# Patient Record
Sex: Female | Born: 1935 | Race: White | Hispanic: No | Smoking: Never smoker
Health system: Southern US, Community
[De-identification: ages and names within clinical notes are randomized; demographics above are authoritative.]

## PROBLEM LIST (undated history)

## (undated) DIAGNOSIS — R569 Unspecified convulsions: Secondary | ICD-10-CM

## (undated) DIAGNOSIS — I639 Cerebral infarction, unspecified: Secondary | ICD-10-CM

## (undated) DIAGNOSIS — I5042 Chronic combined systolic (congestive) and diastolic (congestive) heart failure: Secondary | ICD-10-CM

## (undated) DIAGNOSIS — H3563 Retinal hemorrhage, bilateral: Secondary | ICD-10-CM

## (undated) DIAGNOSIS — I2510 Atherosclerotic heart disease of native coronary artery without angina pectoris: Secondary | ICD-10-CM

## (undated) DIAGNOSIS — K219 Gastro-esophageal reflux disease without esophagitis: Secondary | ICD-10-CM

## (undated) DIAGNOSIS — I10 Essential (primary) hypertension: Secondary | ICD-10-CM

## (undated) DIAGNOSIS — H8110 Benign paroxysmal vertigo, unspecified ear: Secondary | ICD-10-CM

## (undated) DIAGNOSIS — M1990 Unspecified osteoarthritis, unspecified site: Secondary | ICD-10-CM

## (undated) DIAGNOSIS — R269 Unspecified abnormalities of gait and mobility: Secondary | ICD-10-CM

## (undated) DIAGNOSIS — E1142 Type 2 diabetes mellitus with diabetic polyneuropathy: Secondary | ICD-10-CM

## (undated) DIAGNOSIS — S82891A Other fracture of right lower leg, initial encounter for closed fracture: Secondary | ICD-10-CM

## (undated) DIAGNOSIS — I739 Peripheral vascular disease, unspecified: Secondary | ICD-10-CM

## (undated) DIAGNOSIS — I219 Acute myocardial infarction, unspecified: Secondary | ICD-10-CM

## (undated) DIAGNOSIS — G40309 Generalized idiopathic epilepsy and epileptic syndromes, not intractable, without status epilepticus: Secondary | ICD-10-CM

## (undated) DIAGNOSIS — I255 Ischemic cardiomyopathy: Secondary | ICD-10-CM

## (undated) DIAGNOSIS — E785 Hyperlipidemia, unspecified: Secondary | ICD-10-CM

## (undated) DIAGNOSIS — E119 Type 2 diabetes mellitus without complications: Secondary | ICD-10-CM

## (undated) HISTORY — DX: Peripheral vascular disease, unspecified: I73.9

## (undated) HISTORY — DX: Essential (primary) hypertension: I10

## (undated) HISTORY — DX: Hyperlipidemia, unspecified: E78.5

## (undated) HISTORY — DX: Cerebral infarction, unspecified: I63.9

## (undated) HISTORY — PX: ABDOMINAL HYSTERECTOMY: SHX81

## (undated) HISTORY — PX: CARDIAC CATHETERIZATION: SHX172

## (undated) HISTORY — DX: Gastro-esophageal reflux disease without esophagitis: K21.9

## (undated) HISTORY — PX: ABDOMINAL HYSTERECTOMY: SUR658

## (undated) HISTORY — DX: Unspecified abnormalities of gait and mobility: R26.9

## (undated) HISTORY — DX: Generalized idiopathic epilepsy and epileptic syndromes, not intractable, without status epilepticus: G40.309

## (undated) HISTORY — DX: Benign paroxysmal vertigo, unspecified ear: H81.10

## (undated) HISTORY — DX: Other fracture of right lower leg, initial encounter for closed fracture: S82.891A

## (undated) HISTORY — PX: CORONARY ARTERY BYPASS GRAFT: SHX141

## (undated) HISTORY — PX: OTHER SURGICAL HISTORY: SHX169

## (undated) HISTORY — DX: Atherosclerotic heart disease of native coronary artery without angina pectoris: I25.10

## (undated) HISTORY — PX: KNEE SURGERY: SHX244

## (undated) HISTORY — DX: Type 2 diabetes mellitus with diabetic polyneuropathy: E11.42

---

## 1998-04-29 ENCOUNTER — Ambulatory Visit (HOSPITAL_COMMUNITY): Admission: RE | Admit: 1998-04-29 | Discharge: 1998-04-29 | Payer: Self-pay | Admitting: Cardiovascular Disease

## 2000-01-10 ENCOUNTER — Encounter: Payer: Self-pay | Admitting: Emergency Medicine

## 2000-01-10 ENCOUNTER — Emergency Department (HOSPITAL_COMMUNITY): Admission: EM | Admit: 2000-01-10 | Discharge: 2000-01-10 | Payer: Self-pay | Admitting: Emergency Medicine

## 2010-09-14 ENCOUNTER — Ambulatory Visit: Payer: Self-pay | Admitting: Cardiology

## 2010-10-14 ENCOUNTER — Ambulatory Visit: Payer: Self-pay | Admitting: Internal Medicine

## 2010-10-20 ENCOUNTER — Ambulatory Visit: Payer: Self-pay | Admitting: Cardiovascular Disease

## 2010-10-28 ENCOUNTER — Ambulatory Visit: Payer: Self-pay | Admitting: Internal Medicine

## 2010-10-28 ENCOUNTER — Ambulatory Visit (HOSPITAL_COMMUNITY): Admission: RE | Admit: 2010-10-28 | Discharge: 2010-10-28 | Payer: Self-pay | Admitting: Internal Medicine

## 2010-11-09 ENCOUNTER — Ambulatory Visit: Payer: Self-pay | Admitting: Cardiology

## 2010-11-26 ENCOUNTER — Ambulatory Visit: Payer: Self-pay | Admitting: Cardiovascular Disease

## 2010-12-03 ENCOUNTER — Ambulatory Visit
Admission: RE | Admit: 2010-12-03 | Discharge: 2010-12-03 | Payer: Self-pay | Source: Home / Self Care | Attending: Cardiology | Admitting: Cardiology

## 2010-12-28 ENCOUNTER — Encounter: Payer: Self-pay | Admitting: Cardiology

## 2010-12-29 ENCOUNTER — Ambulatory Visit: Payer: Self-pay | Admitting: Cardiovascular Disease

## 2011-01-06 ENCOUNTER — Ambulatory Visit
Admission: RE | Admit: 2011-01-06 | Discharge: 2011-01-06 | Payer: Self-pay | Source: Home / Self Care | Attending: Internal Medicine | Admitting: Internal Medicine

## 2011-01-07 ENCOUNTER — Ambulatory Visit (HOSPITAL_COMMUNITY)
Admission: RE | Admit: 2011-01-07 | Discharge: 2011-01-07 | Payer: Self-pay | Source: Home / Self Care | Attending: Internal Medicine | Admitting: Internal Medicine

## 2011-01-27 NOTE — Letter (Signed)
Summary: External Correspondence/ PROGRESS NOTE [facility]  External Correspondence/ PROGRESS NOTE [facility]   Imported By: Dorise Hiss 12/29/2010 10:36:11  _____________________________________________________________________  External Attachment:    Type:   Image     Comment:   External Document

## 2011-02-14 ENCOUNTER — Ambulatory Visit (HOSPITAL_COMMUNITY): Payer: Self-pay

## 2011-02-14 ENCOUNTER — Other Ambulatory Visit (HOSPITAL_COMMUNITY): Payer: Self-pay | Admitting: Internal Medicine

## 2011-02-17 ENCOUNTER — Ambulatory Visit (HOSPITAL_COMMUNITY)
Admission: RE | Admit: 2011-02-17 | Discharge: 2011-02-17 | Disposition: A | Discharge status: Home / Self Care | Payer: Medicare Other | Source: Ambulatory Visit | Attending: Gastroenterology | Admitting: Gastroenterology

## 2011-02-17 ENCOUNTER — Ambulatory Visit (HOSPITAL_COMMUNITY)
Admission: RE | Admit: 2011-02-17 | Discharge: 2011-02-17 | Disposition: A | Discharge status: Home / Self Care | Payer: Medicare Other | Source: Ambulatory Visit | Attending: Internal Medicine | Admitting: Internal Medicine

## 2011-02-17 DIAGNOSIS — R1310 Dysphagia, unspecified: Secondary | ICD-10-CM | POA: Insufficient documentation

## 2011-03-08 LAB — POCT I-STAT GLUCOSE
Glucose, Bld: 173 mg/dL — ABNORMAL HIGH (ref 70–99)
Operator id: 141321

## 2011-03-08 LAB — GLUCOSE, CAPILLARY: Glucose-Capillary: 111 mg/dL — ABNORMAL HIGH (ref 70–99)

## 2011-03-31 ENCOUNTER — Telehealth: Payer: Self-pay | Admitting: Cardiovascular Disease

## 2011-03-31 NOTE — Telephone Encounter (Signed)
Pharmacist called, she reviewed pts meds due to C/O nightmares, hallucintion at night. Found metoprolol can cause night disturbances and sugg trial of atenolol. Dr Elease Hashimoto contacted and was ok with dc of metoprolol and start of atenolol 50mg  bid #60 R1. Called ok to pharmacy.Alfonso Ramus RN

## 2011-04-04 ENCOUNTER — Telehealth: Payer: Self-pay | Admitting: Cardiovascular Disease

## 2011-04-04 MED ORDER — ATENOLOL 50 MG PO TABS: ORAL_TABLET | ORAL | Status: DC

## 2011-04-04 NOTE — Telephone Encounter (Signed)
PT HAVING BP PROBLEMS. SW HER PCP, SUGGESTED SHE CALL HERE AND WANTS TO BE SEEN TODAY. PLACED CHART IN BOX.

## 2011-04-04 NOTE — Telephone Encounter (Signed)
Pt said bp was low since new med of Atenolol 50mg  bid. 113/59, 109/54, 123/54, 113/56, 129/59. This am it was 139/65. Pt feels well other than cold like symtoms of congestion head and cough. Dr Elease Hashimoto consulted. Pt to lower dose to 50mg  in am and 25mg  at hs. App set up for follow up to bp.Pt verbalized understanding.Alfonso Ramus RN

## 2011-04-12 ENCOUNTER — Encounter: Payer: Self-pay | Admitting: *Deleted

## 2011-04-13 ENCOUNTER — Ambulatory Visit (INDEPENDENT_AMBULATORY_CARE_PROVIDER_SITE_OTHER): Payer: Medicare Other | Admitting: Cardiovascular Disease

## 2011-04-13 ENCOUNTER — Encounter: Payer: Self-pay | Admitting: Cardiovascular Disease

## 2011-04-13 DIAGNOSIS — I509 Heart failure, unspecified: Secondary | ICD-10-CM | POA: Insufficient documentation

## 2011-04-13 DIAGNOSIS — I2510 Atherosclerotic heart disease of native coronary artery without angina pectoris: Secondary | ICD-10-CM

## 2011-04-13 NOTE — Assessment & Plan Note (Signed)
She's currently on a beta blocker. I tried to add lisinopril during her last visit but for some reason she is not on that medication. In addition she does not tolerate Lasix.  I've encouraged her shoulder radiating any extra salt.  It is somewhat difficult to manage her congestive heart failure because of her intolerance to multiple medications.  Fortunately, she is not very symptomatic.

## 2011-04-13 NOTE — Progress Notes (Signed)
[name] Date of Birth  1931/06/22 [facility] [address]     [address] [place], [place]  [address] [phone]  Fax  [phone]  History of Present Illness:  [name] is an elderly female with a history of coronary artery disease and mild congestive heart failure. She is status post coronary artery bypass grafting. Her last heart catheterization in December 2007 revealed patent grafts. She was found have an ejection fraction of 40% at that time.  She's been tried on Lasix on several occasions but has had syncope/presyncope each time she tried to take it.    She also has a history of stroke and has been found have chronic aspiration.  She complains of shortness of breath particularly when she lies down. She does not get much exercise because of her stroke and other issues.  Current Outpatient Prescriptions on File Prior to Visit  Medication Sig Dispense Refill  . allopurinol (ZYLOPRIM) 300 MG tablet Take 300 mg by mouth daily.        Marland Kitchen atenolol (TENORMIN) 50 MG tablet One tablet in am and a half tablet (25mg ) in pm  30 tablet  11  . calcium citrate (CALCITRATE - DOSED IN MG ELEMENTAL CALCIUM) 950 MG tablet Take 1 tablet by mouth 4 (four) times daily.        . fish oil-omega-3 fatty acids 1000 MG capsule Take 2 g by mouth 2 (two) times daily.        Marland Kitchen glyBURIDE (DIABETA) 5 MG tablet Take 5 mg by mouth 2 (two) times daily with a meal. Take 7.5mg  daily       . hydrOXYzine (ATARAX) 25 MG tablet Take 25 mg by mouth 3 (three) times daily as needed. Take 1-1/2 tabs TID       . metFORMIN (GLUCOPHAGE-XR) 500 MG 24 hr tablet Take 500 mg by mouth 4 (four) times daily.        Marland Kitchen omeprazole (PRILOSEC) 20 MG capsule Take 20 mg by mouth 2 (two) times daily.        Marland Kitchen aspirin 81 MG tablet Take 81 mg by mouth daily.        Marland Kitchen dexlansoprazole (DEXILANT) 60 MG capsule Take 60 mg by mouth daily.        Marland Kitchen DISCONTD: clopidogrel (PLAVIX) 75 MG tablet Take 75 mg  by mouth daily.        Marland Kitchen DISCONTD: metoprolol (LOPRESSOR) 50 MG tablet Take 50 mg by mouth 2 (two) times daily. Take 50mg  in the AM and 25mg  each PM         Allergies  Allergen Reactions  . Furosemide Other (See Comments)    presyncope  . Bactrim Rash  . Ciprocin-Fluocin-Procin (Fluocinolone Acetonide) Rash  . Codeine Rash  . Morphine And Related Rash  . Pentazocine Rash    Past Medical History  Diagnosis Date  . CHF (congestive heart failure)     EF 48%  . Stroke   . Diabetes mellitus   . Hypertension   . Gout   . GERD (gastroesophageal reflux disease)   . Hyperkalemia     Past Surgical History  Procedure Date  . Coronary artery bypass graft     1997  . Cardiac catheterization     11/2010-patent grafts  . Knee surgery     bilateral  . Abdominal hysterectomy     History  Smoking status  . Never Smoker   Smokeless tobacco  . Not on  file    History  Alcohol Use No    Family History  Problem Relation Age of Onset  . Heart attack Father 56  . Heart disease Father     Reviw of Systems:  Reviewed in the HPI.  All other systems are negative.  Physical Exam: BP 126/68  Pulse 80  Wt 176 lb (79.833 kg) The patient is alert and oriented x 3.  The mood and affect are normal.  The skin is warm and dry.  Color is normal.  The HEENT exam reveals that the sclera are nonicteric.  The mucous membranes are moist.  The carotids are 2+ without bruits.  There is no thyromegaly.  There is no JVD.  The lungs are clear.  The chest wall is non tender.  The heart exam reveals a regular rate with a normal S1 and S2.  There are no murmurs, gallops, or rubs.  The PMI is not displaced.   Abdominal exam reveals good bowel sounds.  There is no guarding or rebound.  There is no hepatosplenomegaly or tenderness.  There are no masses.  Exam of the legs reveal no clubbing, cyanosis, or edema.  The legs are without rashes.  The distal pulses are intact.  Cranial nerves II - XII are intact.   Motor and sensory functions are intact.  The gait is normal.  Assessment / Plan:

## 2011-04-13 NOTE — Assessment & Plan Note (Signed)
Mrs. [name] is doing fairly well. She describes some episodes of chest pain but they're very atypical. In addition, we performed a heart catheterization on her 5 months ago and she had patent grafts. I do not think that her symptoms are due to angina. I've encouraged her to try to exercise as much is possible. We'll see her in 6 months.

## 2011-06-27 ENCOUNTER — Other Ambulatory Visit: Payer: Self-pay | Admitting: *Deleted

## 2011-06-27 MED ORDER — ATENOLOL 50 MG PO TABS: ORAL_TABLET | ORAL | Status: DC

## 2011-06-27 NOTE — Progress Notes (Signed)
Pt called and dose confirmed,  App made at pt request for September. Med refilled. jodette briely rn

## 2011-07-29 ENCOUNTER — Encounter (INDEPENDENT_AMBULATORY_CARE_PROVIDER_SITE_OTHER): Payer: Medicare Other | Admitting: Ophthalmology

## 2011-07-31 ENCOUNTER — Inpatient Hospital Stay (HOSPITAL_COMMUNITY): Payer: Medicare Other

## 2011-07-31 ENCOUNTER — Inpatient Hospital Stay (HOSPITAL_COMMUNITY)
Admission: AD | Admit: 2011-07-31 | Discharge: 2011-08-03 | DRG: 470 | Disposition: A | Discharge status: Skilled Nursing Facility | Payer: Medicare Other | Source: Other Acute Inpatient Hospital | Attending: Internal Medicine | Admitting: Internal Medicine

## 2011-07-31 DIAGNOSIS — D649 Anemia, unspecified: Secondary | ICD-10-CM | POA: Diagnosis present

## 2011-07-31 DIAGNOSIS — Z951 Presence of aortocoronary bypass graft: Secondary | ICD-10-CM

## 2011-07-31 DIAGNOSIS — Z96659 Presence of unspecified artificial knee joint: Secondary | ICD-10-CM

## 2011-07-31 DIAGNOSIS — G40909 Epilepsy, unspecified, not intractable, without status epilepticus: Secondary | ICD-10-CM | POA: Diagnosis present

## 2011-07-31 DIAGNOSIS — D72829 Elevated white blood cell count, unspecified: Secondary | ICD-10-CM | POA: Diagnosis not present

## 2011-07-31 DIAGNOSIS — Z66 Do not resuscitate: Secondary | ICD-10-CM | POA: Diagnosis present

## 2011-07-31 DIAGNOSIS — I10 Essential (primary) hypertension: Secondary | ICD-10-CM | POA: Diagnosis present

## 2011-07-31 DIAGNOSIS — W19XXXA Unspecified fall, initial encounter: Secondary | ICD-10-CM | POA: Diagnosis present

## 2011-07-31 DIAGNOSIS — M109 Gout, unspecified: Secondary | ICD-10-CM | POA: Diagnosis present

## 2011-07-31 DIAGNOSIS — E119 Type 2 diabetes mellitus without complications: Secondary | ICD-10-CM | POA: Diagnosis present

## 2011-07-31 DIAGNOSIS — I69921 Dysphasia following unspecified cerebrovascular disease: Secondary | ICD-10-CM

## 2011-07-31 DIAGNOSIS — S72009A Fracture of unspecified part of neck of unspecified femur, initial encounter for closed fracture: Principal | ICD-10-CM | POA: Diagnosis present

## 2011-07-31 DIAGNOSIS — I2510 Atherosclerotic heart disease of native coronary artery without angina pectoris: Secondary | ICD-10-CM | POA: Diagnosis present

## 2011-07-31 DIAGNOSIS — I509 Heart failure, unspecified: Secondary | ICD-10-CM | POA: Diagnosis present

## 2011-07-31 DIAGNOSIS — Z79899 Other long term (current) drug therapy: Secondary | ICD-10-CM

## 2011-07-31 DIAGNOSIS — I2589 Other forms of chronic ischemic heart disease: Secondary | ICD-10-CM | POA: Diagnosis present

## 2011-07-31 DIAGNOSIS — K219 Gastro-esophageal reflux disease without esophagitis: Secondary | ICD-10-CM | POA: Diagnosis present

## 2011-07-31 HISTORY — PX: OTHER SURGICAL HISTORY: SHX169

## 2011-07-31 LAB — PROTIME-INR
INR: 1.05 (ref 0.00–1.49)
Prothrombin Time: 13.9 seconds (ref 11.6–15.2)

## 2011-07-31 LAB — GLUCOSE, CAPILLARY

## 2011-08-01 ENCOUNTER — Inpatient Hospital Stay (HOSPITAL_COMMUNITY): Payer: Medicare Other

## 2011-08-01 LAB — BASIC METABOLIC PANEL
BUN: 26 mg/dL — ABNORMAL HIGH (ref 6–23)
Calcium: 8.8 mg/dL (ref 8.4–10.5)
GFR calc Af Amer: 57 mL/min — ABNORMAL LOW (ref >60)
GFR calc non Af Amer: 47 mL/min — ABNORMAL LOW (ref >60)
Glucose, Bld: 162 mg/dL — ABNORMAL HIGH (ref 70–99)
Potassium: 4.8 mEq/L (ref 3.5–5.1)
Sodium: 136 mEq/L (ref 135–145)

## 2011-08-01 LAB — GLUCOSE, CAPILLARY: Glucose-Capillary: 232 mg/dL — ABNORMAL HIGH (ref 70–99)

## 2011-08-01 LAB — CBC
HCT: 26.1 % — ABNORMAL LOW (ref 36.0–46.0)
Hemoglobin: 8.5 g/dL — ABNORMAL LOW (ref 12.0–15.0)
MCH: 28.3 pg (ref 26.0–34.0)
MCHC: 32.6 g/dL (ref 30.0–36.0)
RDW: 15.3 % (ref 11.5–15.5)

## 2011-08-01 LAB — HEMOGLOBIN A1C
Hgb A1c MFr Bld: 8 % — ABNORMAL HIGH (ref <5.7)
Mean Plasma Glucose: 183 mg/dL — ABNORMAL HIGH (ref <117)

## 2011-08-02 LAB — GLUCOSE, CAPILLARY
Glucose-Capillary: 246 mg/dL — ABNORMAL HIGH (ref 70–99)
Glucose-Capillary: 193 mg/dL — ABNORMAL HIGH (ref 70–99)
Glucose-Capillary: 183 mg/dL — ABNORMAL HIGH (ref 70–99)

## 2011-08-02 LAB — CBC
MCHC: 33.6 g/dL (ref 30.0–36.0)
Platelets: 171 10*3/uL (ref 150–400)
RDW: 15.5 % (ref 11.5–15.5)
WBC: 13.2 10*3/uL — ABNORMAL HIGH (ref 4.0–10.5)

## 2011-08-02 LAB — BASIC METABOLIC PANEL
BUN: 28 mg/dL — ABNORMAL HIGH (ref 6–23)
CO2: 20 mEq/L (ref 19–32)
Chloride: 101 mEq/L (ref 96–112)
Glucose, Bld: 161 mg/dL — ABNORMAL HIGH (ref 70–99)
Potassium: 4.8 mEq/L (ref 3.5–5.1)
Sodium: 133 mEq/L — ABNORMAL LOW (ref 135–145)

## 2011-08-02 LAB — PROTIME-INR
INR: 1.26 (ref 0.00–1.49)
Prothrombin Time: 16.1 seconds — ABNORMAL HIGH (ref 11.6–15.2)

## 2011-08-02 NOTE — H&P (Signed)
Tiffany, Proctor NO.:  [id number]  MEDICAL RECORD NO.:  [id number]  LOCATION:  2014                         FACILITY:  [facility]  PHYSICIAN:  Tarry Kos, MD       DATE OF BIRTH:  [birth year]  DATE OF ADMISSION:  07/31/2011 DATE OF DISCHARGE:                             HISTORY & PHYSICAL   CHIEF COMPLAINT:  Larey Seat, left hip pain.  HISTORY OF PRESENT ILLNESS:  [name] is a 77 year old female status post CABG in 1997, diabetic, history of CVA, and seizure disorder, who presents after being transferred from [facility] with a left hip fracture. She was going to the bathroom earlier this morning, was pulling down her pants and getting on the toilet and she sort of toppled over before actually sitting on the toilet to go to the bathroom.  She says she just lost her balance.  She denies any loss of consciousness or any seizure activity.  She is transferred here for orthopedic surgery.  We are being asked to admit the patient for ortho.  She has no active medical issues.  REVIEW OF SYSTEMS:  Otherwise negative.  PAST MEDICAL HISTORY: 1. Status post CABG. 2. She has had a cardiac cath in December 2011, which showed patent     grafts, EF is 40%, global hypokinesis.  It was recommended medical     management for coronary artery disease and congestive heart failure     at that time. 3. Seizure disorder. 4. Status post CVA with resultant dysphasia. 5. She had a Schatzki ring and sliding hiatal hernia on an EGD in     January 2012, status post dilation of esophagus at that time. 6. Polypectomies on colonoscopy also in January 2012. 7. History of mild congestive heart failure. 8. Hypertension. 9. Gout.  SOCIAL HISTORY:  She is a nonsmoker.  No alcohol.  No IV drug abuse. She has one son.  She is DNR.  ALLERGIES:  CIPRO, MORPHINE, CODEINE, SULFA, LEVAQUIN, CEPHALOSPORINS, PROTONIX; all of these are listed as swelling and/or rash.  She has never taken [name]  before.  HOME MEDICATIONS: 1. Zinc oxide ointment as needed. 2. Artificial tears. 3. Citracal. 4. Vitamin D over the counter 2 tablets twice a day. 5. Tono-Pen as needed. 6. Biotin 1000 mg daily. 7. Red yeast rice 600 mg daily. 8. Fish oil 1 g 2 tablets daily. 9. Vitamin D3 over the counter daily. 10.Aggrenox 25/200 one capsule twice a day. 11.Hydroxyzine 25 mg one and a half tablets 3 times a day. 12.Omeprazole 20 mg twice a day. 13.Lamotrigine 100 mg a half tablet twice a day. 14.Atenolol 50 mg twice a day. 15.Glyburide 5 mg twice a day. 16.Metformin 500 mg 2 tablets twice a day. 17.Allopurinol 300 mg 1 tablet daily.  PHYSICAL EXAMINATION:  VITAL SIGNS:  Temperature is 97.6, pulse 88, respirations 20, blood pressure 150/76, 96% O2 sats on 2 L nasal cannula. GENERAL:  She is alert and oriented x4, no apparent distress, cooperative, friendly. HEENT:  Extraocular muscles intact.  Pupils are equal, reactive to light.  Oropharynx clear.  Mucous membranes moist. NECK:  JVD.  No carotid bruits. CARDIAC:  Regular rate and rhythm without murmurs,  or gallops. CHEST:  Clear to auscultation bilaterally.  No wheezes, rhonchi, or rales. ABDOMEN:  Soft, nontender, nondistended.  Positive bowel sounds.  No hepatosplenomegaly. EXTREMITIES:  No clubbing, cyanosis, edema.  A painful left hip. NEUROLOGIC:  Cranial nerves II through XII grossly intact.  No focal neurologic deficits.  Lower extremities not assessed due to pain and obvious fracture. HIP:  Left hip x-ray from outside facility shows an impacted left femoral neck fracture.  INR is 0.9, PT is 13.28, a PTT is 29.2.  Glucose 211, BUN and creatinine 32 and 1.09, calcium 10, sodium 136, potassium 4.8, chloride 102, CO2 is 24.  Her hemoglobin is 10.8, white count 13.7, platelet count 280. Chest x-ray is negative.  A 12-lead EKG shows right bundle-branch block, normal sinus rhythm.  ASSESSMENT AND PLAN:  This is a 77 year old female  with acute left hip fracture from a mechanical fall. 1. Acute left hip fracture from mechanical.  I am going to hold her     Aggrenox at this time.  Dr. Otelia Sergeant, on call orthopedic surgeon, has     already been called and is aware that the patient is coming over.     She is supposed to be going to surgery later on today, however,     again, she is on Aggrenox.  I am going to obviously hold this at     this time and provide her some [name] for pain relief. 2. History of coronary artery bypass graft with recent nonobstructive     coronary artery disease on catheterization in December 2011.  She     does have history of heart failure.  We will have to monitor very     closely for any postoperative complications. 3. Seizure disorder.  This is stable.  We will resume her medications     once she is able to take p.o. after the surgery. 4. I recommend putting her on telemetry, monitor due to her cardiac     history. 5. Diabetes.  Hold her diabetic medications right now as she is n.p.o.     and place her on sliding scale insulin only. 6. The patient is do not resuscitate.  She does not want intubation or     cardiopulmonary resuscitation in the future.  However, she is     obviously in agreement for temporary intubation perioperatively for     this surgery. 7. Further recommendation pending an overall hospital course.  We will     proceed with surgical intervention as deemed appropriate by     Orthopedic Surgery.  The only thing that with delay her surgery is     the fact that she is on antiplatelet treatment which will be left     up to Orthopedic Surgery as to when to proceed with her hip repair.          ______________________________ Tarry Kos, MD     RD/MEDQ  D:  07/31/2011  T:  07/31/2011  Job:  [id number]  Electronically Signed by Tarry Kos MD on 08/02/2011 09:57:09 PM

## 2011-08-02 NOTE — Discharge Summary (Signed)
[name], LEVEN NO.:  [id number]  MEDICAL RECORD NO.:  [id number]  LOCATION:  2014                         FACILITY:  [facility]  PHYSICIAN:  Zannie Cove, MD     DATE OF BIRTH:  04/17/1931  DATE OF ADMISSION:  07/31/2011 DATE OF DISCHARGE:                        DISCHARGE SUMMARY - REFERRING   PRIMARY CARE PHYSICIAN:  Doreen Beam, MD  DISCHARGE DIAGNOSES: 1. Left femur neck fracture status post hemiarthroplasty on July 31, 2011. 2. History of coronary artery status post coronary artery bypass graft     with multivessel nonocclusive disease on last cath in December     2011. 3. Ischemic cardiomyopathy with an ejection fraction of 40%. 4. History of cerebrovascular accident. 5. History of dysphasia. 6. Type 2 diabetes. 7. Hypertension. 8. History of seizure disorder. 9. History of Schatzki ring status post dilation in January 2012. 10.Gout.  DISCHARGE MEDICATIONS: 1. Coumadin daily for INR 2-3 for at least 3-4 weeks, total duration     to be determined by Dr. Victorino Dike.  Once the patient is more     ambulatory, this can be discontinued. 2. Vicodin 5/325, 1-2 tablets q.4 h. p.r.n. 3. Robaxin 500 mg p.o. q.6 h. p.r.n. 4. Allopurinol 300 mg daily. 5. Artificial tears 1-2 drops each eye daily as needed. 6. Atenolol 50 mg daily. 7. Biotin 1000 mg daily. 8. Citracal plus D 2 tablets b.i.d. 9. Fish oil 1 gram 2 capsules daily. 10.Glyburide 5 mg p.o. b.i.d. 11.Hydroxyzine 25 mg 1-1/2 tablet p.o. t.i.d. p.r.n. for itching. 12.Lamotrigine 100 mg tab 1/2 tablet p.o. b.i.d. 13.Metformin 500 mg 2 tablets p.o. b.i.d. 14.Omeprazole 20 mg b.i.d. 15.Red yeast rice 600 mg 1 tablet daily. 16.Tylenol Extra Strength 500 mg 2 tablets p.o. daily. 17.Tylenol PM 500/25 mg. 18.Diphenhydramine 1 tablet p.o. at bedtime. 19.Vitamin D3 1 tablet daily. 20.Zinc oxide 1 application topically q.4 h. p.r.n. bedsore. 21.Aggrenox has been discontinued for a short term while  the patient     is on Coumadin.  CONSULTANTS:  Dr. Toni Arthurs, Orthopedic Surgery.  PROCEDURES:  Left hip hemiarthroplasty on July 31, 2011.  IMAGING/DIAGNOSTICS:  X-ray of the hip August 5 shows left hip prosthesis with near anatomical alignment position.  Pelvic fracture shows satisfactory postoperative appearance of the unipolar left hip arthroplasty without evidence of hardware complications.  HOSPITAL COURSE:  [name] is a 77 year old female with multiple medical problems initially presented to [facility] with the pain in left hip with a fall and pain in her left hip after she was found to have a femur neck fracture and transferred to [facility] for orthopedic surgical evaluation. 1. For left hip hemiarthroplasty, she was seen by Dr. Victorino Dike.  Her     Aggrenox was held.  She underwent surgery the next day.  She had     good postoperative course without any cardiopulmonary events. 2. In terms of her DVT prophylaxis for hip fracture, she was started     on Coumadin per Dr. Victorino Dike.  Her INR is subtherapeutic today.  I am     going to start her on low-dose Lovenox today till INR is closer to     1.5 or  so and then she will just be on Coumadin for the duration to     be determined by Dr. Victorino Dike. 3. History of CAD status post CABG.  She had nonobstructive disease on     her cath in December 2012.  Did not have evidence of congestive     heart failure in the hospital.  She has not been on diuretics at     home, continued and stable on her home medications. 4. Diabetes.  Her home medications have been resumed. 5. Leukocytosis.  Today I suspect this may be either lab error or     reactive.  Her entire CBC is significantly different from the one     yesterday.  Hence we will repeat it in the morning.  The patient is     afebrile and otherwise asymptomatic.  DISCHARGE CONDITION:  Stable.  DISCHARGE VITAL SIGNS:  Temperature is 98.7, pulse 82, blood pressure 152/69,  respirations 18, satting 93% on room air.  DISCHARGE FOLLOWUP:  Dr. Victorino Dike in 1 week of discharge and primary physician, Dr. Doreen Beam or nursing home physician within 1 week.     Zannie Cove, MD     PJ/MEDQ  D:  08/02/2011  T:  08/02/2011  Job:  [id number]  cc:   Doreen Beam, MD  Electronically Signed by Zannie Cove  on 08/02/2011 05:45:28 PM

## 2011-08-03 LAB — GLUCOSE, CAPILLARY: Glucose-Capillary: 260 mg/dL — ABNORMAL HIGH (ref 70–99)

## 2011-08-03 LAB — CBC
HCT: 24.6 % — ABNORMAL LOW (ref 36.0–46.0)
MCH: 28.5 pg (ref 26.0–34.0)
MCV: 86.6 fL (ref 78.0–100.0)
Platelets: 258 10*3/uL (ref 150–400)
RBC: 2.84 MIL/uL — ABNORMAL LOW (ref 3.87–5.11)
RDW: 15.3 % (ref 11.5–15.5)

## 2011-08-04 NOTE — Consult Note (Signed)
[name], DOOLY NO.:  [id number]  MEDICAL RECORD NO.:  [id number]  LOCATION:  2014                         FACILITY:  [facility]  PHYSICIAN:  Toni Arthurs, MD        DATE OF BIRTH:  [birth year]  DATE OF CONSULTATION:  07/31/2011 DATE OF DISCHARGE:                                CONSULTATION   REASON FOR CONSULTATION:  Left hip fracture.  CONSULTING PHYSICIAN:  Dr. Onalee Hua.  HISTORY OF PRESENT ILLNESS:  The patient is a 77 year old female with past medical history significant for coronary artery disease, stroke and diabetes, who fell this morning while going to the bathroom.  She complains of left hip pain that is dull and aching.  It is worse with attempts at motion and better with lying still.  She denies any history of left hip surgery or injury in the past.  She is not a smoker.  She has a history of diabetes with complications including peripheral neuropathy and amputation of the right hallux.  PAST MEDICAL HISTORY:  Coronary artery disease, stroke, type 2 diabetes, hypertension.  PAST SURGICAL HISTORY:  Coronary artery bypass grafting, bilateral knee replacements, right ankle ORIF, right hallux amputation.  FAMILY HISTORY:  Hypertension, diabetes, ovarian cancer, coronary artery disease.  SOCIAL HISTORY:  The patient lives alone in [place].  She does not use any tobacco products or drink any alcohol.  REVIEW OF SYSTEMS:  No recent fever, chills, nausea, vomiting or changes in her appetite.  REVIEW OF SYSTEMS:  As above and otherwise negative.  PHYSICAL EXAMINATION:  GENERAL:  The patient is a well-nourished, well- developed woman, in no apparent distress.  She is alert and oriented x4. Mood and affect normal. HEENT:  Extraocular motions are intact.  Respirations are unlabored. She is seen lying supine on hospital bed. EXTREMITIES:  The left lower extremity is shortened and slightly externally rotated. SKIN:  Healthy and intact.  She has 1+  dorsalis pedis and posterior tibial pulses.  She has diminished sensibility to light touch in the toes of her left foot.  There is no lymphadenopathy noted.  Strength is 5/5 and plantarflexion and dorsiflexion at the ankle.  X-RAYS:  AP pelvis and cross-table lateral of the left hip are reviewed from [facility].  These show a left femoral neck fracture.  ASSESSMENT:  Left femoral neck displaced fracture in an 77 year old female with substantial comorbidities.  PLAN:  I explained the nature of the injury to the patient and her family in detail.  This is a complicated situation given her extensive comorbidities.  Fixing the hip would require hemiarthroplasty and this is accompanied with risks including bleeding, infection, nerve damage, blood clots, need for additional surgery, hip pain and death.  The alternative treatment is an attempt at closed management and this likely would lead to blood clots or pneumonia and as an extremely high morbidity and mortality.  The patient and her family understand the risks and benefits of these alternative treatment options and would like to proceed with surgical treatment.  She is cleared by Dr. Onalee Hua the hospitalist for surgery today.  We will get her scheduled for the OR as quickly as possible.  This is a high-risk situation requiring complex medical decision making.     Toni Arthurs, MD     JH/MEDQ  D:  07/31/2011  T:  08/01/2011  Job:  [id number]  cc:   Doreen Beam, MD  Electronically Signed by Toni Arthurs  on 08/04/2011 12:41:46 PM

## 2011-08-04 NOTE — Op Note (Signed)
NAMEELLEANOR, [name] NO.:  [id number]  MEDICAL RECORD NO.:  [id number]  LOCATION:  2014                         FACILITY:  [facility]  PHYSICIAN:  Toni Arthurs, MD        DATE OF BIRTH:  1931-11-24  DATE OF PROCEDURE:  07/31/2011 DATE OF DISCHARGE:                              OPERATIVE REPORT   PREOPERATIVE DIAGNOSIS:  Left femoral neck fracture.  POSTOPERATIVE DIAGNOSIS:  Left femoral neck fracture.  PROCEDURE:  Left hip hemiarthroplasty.  SURGEON:  Toni Arthurs, MD  FIRST ASSISTANT:  Skip Mayer, [facility]  ANESTHESIA:  Spinal.  IV FLUIDS:  See anesthesia record.  ESTIMATED BLOOD LOSS:  100 mL.  COMPLICATIONS:  None apparent.  DISPOSITION:  Extubated awake and stable to recovery.  INDICATIONS FOR PROCEDURE:  The patient is a 77 year old female with past medical history significant for stroke, coronary artery disease, hypertension and diabetes who fell this morning fracturing her left hip. She was transferred from [facility] and presents now for operative treatment of this injury.  She and her family understand the risks and benefits of this surgery as well as the alternative treatment options.  Specifically, they understand risks of bleeding, infection, nerve damage, blood clots, need for additional surgery, chronic hip pain and death.  They elect to proceed.  PROCEDURE IN DETAIL:  After preoperative consent was obtained, the correct operative site was identified.  The patient was brought to the operating room and placed supine on the operating table.  General anesthesia was induced.  Preoperative antibiotics were administered. Surgical time-out was taken.  The patient was then turned into the lateral decubitus position with the left hip up.  The left lower extremity was prepped and draped in standard sterile fashion.  A curvilinear incision was marked over the greater trochanter.  This incision was made and sharp dissection was carried down  through the skin and subcutaneous tissue to the level of the IT band.  The IT band was split in line with its fibers and this was carried posteriorly into the gluteus maximus and its superficial fascia.  The short external rotators were exposed.  The piriformis tendon was tagged.  A capsulotomy was then made at the posterior aspect with femoral neck superior to the piriformis.  The distal limb of this L-shaped capsulotomy was then carried down the posterior aspect of the piriformis fossa releasing the short external rotators.  The femoral neck fracture was identified.  The femoral head was removed with a corkscrew.  All residual soft tissue was removed from the acetabulum.  Bony fragments were removed with a rongeur and irrigation.  The femoral neck was then exposed.  The cutting guide was applied such that the femoral neck was cut approximately one fingerbreadth from the superior aspect of the lesser trochanter.  A box cutting osteotome was then used to remove the superior aspect of the neck.  The canal finder was used to locate the canal.  A lateralizing reamer was used to lateralize the canal into the greater trochanter. The femoral neck was then sequentially broached to a size 4.  This was noted to have appropriate fit and appropriate anteversion.  A trial femoral head and  neck were placed on the trial stem.  The hip was reduced.  The hip was noted to be somewhat unstable in 90 degrees of flexion and approximately 60 degrees of internal rotation. The -3 stem was changed out for a neutral stem.  The hip was now stable to approximately 75 degrees of internal rotation with hip flexed 90.  It had a negative shuck and had appropriate resting tension.  Position of sleep was also noted to be stable.  All the trial components were removed.  A size 4 Summit basic stem was then impacted in the femoral neck after irrigating copiously.  The neck and head were trialed again and again noted to be  stable.  The final neck and head were impacted into position again after copiously irrigating.  The hip was reduced and was again noted to be stable in all the aforementioned ranges of motion. The wound was irrigated copiously.  The short external rotators were repaired back to the piriformis fossa using drill holes.  The IT band was closed with 0 Vicryl figure-of-eight sutures in watertight fashion. The subcutaneous tissue was approximated with inverted simple sutures of 3-0 Monocryl.  Skin was closed with a running 3-0 Prolene suture. Sterile dressings were applied, followed by an abduction pillow.  The patient was then awakened from anesthesia and transported to the recovery room in stable condition.  Skip Mayer, PA-C was present, scrubbed for the duration of the case and was gaining exposure, dislocating and reducing the hip as well as maintaining exposure throughout the case and assisting in closure.  FOLLOWUP PLAN:  The patient will be weightbearing as tolerated on her left hip.  She will have Physical Therapy and Occupational Therapy consults.  She will follow up with me as an outpatient in 2 to 3 weeks for suture removal.   Toni Arthurs, MD     JH/MEDQ  D:  07/31/2011  T:  08/01/2011  Job:  [id number]  cc:   Doreen Beam, MD  Electronically Signed by Toni Arthurs  on 08/04/2011 12:41:52 PM

## 2011-08-19 ENCOUNTER — Encounter (INDEPENDENT_AMBULATORY_CARE_PROVIDER_SITE_OTHER): Payer: Medicare Other | Admitting: Ophthalmology

## 2011-08-31 ENCOUNTER — Ambulatory Visit: Payer: Medicare Other | Admitting: Cardiovascular Disease

## 2011-09-23 ENCOUNTER — Encounter: Payer: Self-pay | Admitting: Cardiovascular Disease

## 2011-10-03 DIAGNOSIS — I5023 Acute on chronic systolic (congestive) heart failure: Secondary | ICD-10-CM

## 2011-10-20 ENCOUNTER — Ambulatory Visit (INDEPENDENT_AMBULATORY_CARE_PROVIDER_SITE_OTHER): Payer: Medicare Other | Admitting: Cardiovascular Disease

## 2011-10-20 ENCOUNTER — Encounter: Payer: Self-pay | Admitting: Cardiovascular Disease

## 2011-10-20 DIAGNOSIS — E785 Hyperlipidemia, unspecified: Secondary | ICD-10-CM

## 2011-10-20 DIAGNOSIS — I509 Heart failure, unspecified: Secondary | ICD-10-CM

## 2011-10-20 DIAGNOSIS — I2510 Atherosclerotic heart disease of native coronary artery without angina pectoris: Secondary | ICD-10-CM

## 2011-10-20 NOTE — Progress Notes (Signed)
HPI  This is a 77 year old female who is here today for a followup visit after recent hospitalization at [facility]. The patient use to followup with Dr. Elease Hashimoto but she requested to transfer her care to the [facility] office due to close proximity. She has known history of coronary artery disease status post coronary artery bypass graft surgery. Her most recent cardiac catheterization was in December of 2011 which showed patent grafts. It was done at that time do to atypical chest pain and mild fluid overload. Her ejection fraction is mildly reduced at 40%. She did not tolerate furosemide in the past due to syncope. She has intolerance to multiple medications. She presented recently to [facility] with increased dyspnea and lower extremity edema. She was started on Demadex with good response and without reported side effects. She has been doing well since then. Her dyspnea improved and lower extremity edema has resolved. She was tried in a small dose ACE inhibitor but that was discontinued due to hyperkalemia.  Allergies  Allergen Reactions  . Ace Inhibitors     Hyperkalemia  . Furosemide Other (See Comments)    presyncope  . Levaquin   . Bactrim Rash  . Ciprocin-Fluocin-Procin (Fluocinolone Acetonide) Rash  . Codeine Rash  . Morphine And Related Rash  . Pentazocine Rash     Current Outpatient Prescriptions on File Prior to Visit  Medication Sig Dispense Refill  . acetaminophen (TYLENOL) 500 MG tablet Take 1,000 mg by mouth daily.       . AGGRENOX 25-200 MG per 12 hr capsule Take 1 capsule by mouth 2 (two) times daily at 10 AM and 5 PM.       . allopurinol (ZYLOPRIM) 300 MG tablet Take 300 mg by mouth daily.        Marland Kitchen atenolol (TENORMIN) 50 MG tablet One tablet twice a day  60 tablet  5  . Biotin 1000 MCG tablet Take 1,000 mcg by mouth daily.        Marland Kitchen desoximetasone (TOPICORT) 0.25 % cream Apply topically as needed.       . diphenhydramine-acetaminophen (TYLENOL PM) 25-500 MG TABS  Take 1 tablet by mouth at bedtime.       . fish oil-omega-3 fatty acids 1000 MG capsule Take 2 g by mouth daily.       Marland Kitchen glyBURIDE (DIABETA) 5 MG tablet Take 5 mg by mouth 2 (two) times daily with a meal.       . lamoTRIgine (LAMICTAL) 100 MG tablet 1/2 tab po in am and one tab in evening      . metFORMIN (GLUCOPHAGE-XR) 500 MG 24 hr tablet Take 500 mg by mouth 2 (two) times daily.       Marland Kitchen omeprazole (PRILOSEC) 20 MG capsule Take 20 mg by mouth 2 (two) times daily.        . RED YEAST RICE EXTRACT PO Take 600 mg by mouth daily.           Past Medical History  Diagnosis Date  . Stroke   . Diabetes mellitus   . Hypertension   . Gout   . GERD (gastroesophageal reflux disease)   . Hyperkalemia   . CHF (congestive heart failure)     EF 48%  . Hyperlipidemia      Past Surgical History  Procedure Date  . Coronary artery bypass graft     1997  . Knee surgery     bilateral  . Abdominal hysterectomy   . Cardiac catheterization  11/2010-patent grafts     Family History  Problem Relation Age of Onset  . Heart attack Father 77  . Heart disease Father      History   Social History  . Marital Status: Widowed    Spouse Name: N/A    Number of Children: N/A  . Years of Education: N/A   Occupational History  . Not on file.   Social History Main Topics  . Smoking status: Never Smoker   . Smokeless tobacco: Never Used  . Alcohol Use: No  . Drug Use: No  . Sexually Active: Not on file   Other Topics Concern  . Not on file   Social History Narrative  . No narrative on file      PHYSICAL EXAM   BP 123/75  Pulse 71  Ht 5\' 2"  (1.575 m)  Wt 162 lb (73.483 kg)  BMI 29.63 kg/m2  SpO2 99%  Constitutional: She is oriented to person, place, and time. She appears well-developed and well-nourished. No distress.  HENT: No nasal discharge.  Head: Normocephalic and atraumatic.  Eyes: Pupils are equal, round, and reactive to light. Right eye exhibits no discharge. Left  eye exhibits no discharge.  Neck: Normal range of motion. Neck supple. No JVD present. No thyromegaly present.  Cardiovascular: Normal rate, regular rhythm, normal heart sounds and intact distal pulses. Exam reveals no gallop and no friction rub.  No murmur heard.  Pulmonary/Chest: Effort normal and breath sounds normal. No stridor. No respiratory distress. She has no wheezes. She has no rales. She exhibits no tenderness.  Abdominal: Soft. Bowel sounds are normal. She exhibits no distension. There is no tenderness. There is no rebound and no guarding.  Musculoskeletal: Normal range of motion. She exhibits no edema and no tenderness.  Neurological: She is alert and oriented to person, place, and time. Coordination normal.  Skin: Skin is warm and dry. No rash noted. She is not diaphoretic. No erythema. No pallor.  Psychiatric: She has a normal mood and affect. Her behavior is normal. Judgment and thought content normal.      ASSESSMENT AND PLAN

## 2011-10-20 NOTE — Assessment & Plan Note (Signed)
The patient is not having evidence of angina at this time. Last cardiac catheterization showed patent grafts. Continue medical therapy.

## 2011-10-20 NOTE — Assessment & Plan Note (Signed)
The patient seems to be due to doing reasonably well at this time. Her fluid overload has resolved since she was started on Demadex. She is currently taking half a tablet once daily. I instructed her to continue low-sodium diet and monitor her lower extremity edema. I advised her to weigh herself on a daily basis. She can use one full tablet of Demadex as needed if there are signs of fluid overload as she was instructed. Continue current medications. Unfortunately, she did not tolerate an ACE inhibitor due to hyperkalemia. Consider a small dose ARB at some point which is known to cause less hyperkalemia and an ACE inhibitors.

## 2011-10-20 NOTE — Assessment & Plan Note (Signed)
Patient is on fish oil and red yeast rice. I'm not sure why she is not on a statin. She used to be on Lipitor in the past. Due to have known history of coronary artery disease and diabetes consider a statin if there is no contraindication.

## 2011-10-20 NOTE — Patient Instructions (Signed)
Your physician wants you to follow-up in: 6 months. You will receive a reminder letter in the mail one-two months in advance. If you don't receive a letter, please call our office to schedule the follow-up appointment. Your physician recommends that you continue on your current medications as directed. Please refer to the Current Medication list given to you today. 

## 2011-10-28 ENCOUNTER — Ambulatory Visit (INDEPENDENT_AMBULATORY_CARE_PROVIDER_SITE_OTHER): Payer: Medicare Other | Admitting: Ophthalmology

## 2011-10-28 DIAGNOSIS — E11319 Type 2 diabetes mellitus with unspecified diabetic retinopathy without macular edema: Secondary | ICD-10-CM

## 2011-10-28 DIAGNOSIS — E11359 Type 2 diabetes mellitus with proliferative diabetic retinopathy without macular edema: Secondary | ICD-10-CM

## 2011-10-28 DIAGNOSIS — H43819 Vitreous degeneration, unspecified eye: Secondary | ICD-10-CM

## 2011-11-11 ENCOUNTER — Ambulatory Visit: Payer: Medicare Other | Admitting: Cardiovascular Disease

## 2011-12-27 HISTORY — PX: JOINT REPLACEMENT: SHX530

## 2012-01-09 ENCOUNTER — Other Ambulatory Visit: Payer: Self-pay | Admitting: Orthopedic Surgery

## 2012-01-09 DIAGNOSIS — M25552 Pain in left hip: Secondary | ICD-10-CM

## 2012-01-11 ENCOUNTER — Encounter (HOSPITAL_COMMUNITY): Payer: Self-pay | Admitting: Pharmacy Technician

## 2012-01-15 ENCOUNTER — Other Ambulatory Visit: Payer: Self-pay | Admitting: Orthopedic Surgery

## 2012-01-16 ENCOUNTER — Ambulatory Visit
Admission: RE | Admit: 2012-01-16 | Discharge: 2012-01-16 | Disposition: A | Discharge status: Home / Self Care | Payer: Medicare Other | Source: Ambulatory Visit | Attending: Orthopedic Surgery | Admitting: Orthopedic Surgery

## 2012-01-16 ENCOUNTER — Other Ambulatory Visit: Payer: Self-pay | Admitting: Orthopedic Surgery

## 2012-01-16 DIAGNOSIS — M25552 Pain in left hip: Secondary | ICD-10-CM

## 2012-01-16 LAB — SYNOVIAL CELL COUNT + DIFF, W/ CRYSTALS
Crystals, Fluid: NONE SEEN
Eosinophils-Synovial: 1 % (ref 0–1)
Lymphocytes-Synovial Fld: 28 % — ABNORMAL HIGH (ref 0–20)
Monocyte/Macrophage: 22 % — ABNORMAL LOW (ref 50–90)
Neutrophil, Synovial: 49 % — ABNORMAL HIGH (ref 0–25)

## 2012-01-16 MED ORDER — IOHEXOL 180 MG/ML  SOLN
1.0000 mL | Freq: Once | INTRAMUSCULAR | Status: AC | PRN
  Administered 2012-01-16: 1 mL via INTRA_ARTICULAR

## 2012-01-18 ENCOUNTER — Ambulatory Visit (HOSPITAL_COMMUNITY)
Admission: RE | Admit: 2012-01-18 | Discharge: 2012-01-18 | Disposition: A | Discharge status: Home / Self Care | Payer: Medicare Other | Source: Ambulatory Visit | Attending: Orthopedic Surgery | Admitting: Orthopedic Surgery

## 2012-01-18 ENCOUNTER — Encounter (HOSPITAL_COMMUNITY): Payer: Self-pay

## 2012-01-18 ENCOUNTER — Encounter (HOSPITAL_COMMUNITY)
Admission: RE | Admit: 2012-01-18 | Discharge: 2012-01-18 | Disposition: A | Discharge status: Home / Self Care | Payer: Medicare Other | Source: Ambulatory Visit | Attending: Orthopedic Surgery | Admitting: Orthopedic Surgery

## 2012-01-18 DIAGNOSIS — I509 Heart failure, unspecified: Secondary | ICD-10-CM | POA: Insufficient documentation

## 2012-01-18 DIAGNOSIS — Z951 Presence of aortocoronary bypass graft: Secondary | ICD-10-CM | POA: Insufficient documentation

## 2012-01-18 DIAGNOSIS — J3489 Other specified disorders of nose and nasal sinuses: Secondary | ICD-10-CM | POA: Insufficient documentation

## 2012-01-18 DIAGNOSIS — Z01818 Encounter for other preprocedural examination: Secondary | ICD-10-CM | POA: Insufficient documentation

## 2012-01-18 DIAGNOSIS — I2510 Atherosclerotic heart disease of native coronary artery without angina pectoris: Secondary | ICD-10-CM | POA: Insufficient documentation

## 2012-01-18 DIAGNOSIS — T84099A Other mechanical complication of unspecified internal joint prosthesis, initial encounter: Secondary | ICD-10-CM | POA: Insufficient documentation

## 2012-01-18 DIAGNOSIS — Z01812 Encounter for preprocedural laboratory examination: Secondary | ICD-10-CM | POA: Insufficient documentation

## 2012-01-18 DIAGNOSIS — I10 Essential (primary) hypertension: Secondary | ICD-10-CM | POA: Insufficient documentation

## 2012-01-18 DIAGNOSIS — Z8673 Personal history of transient ischemic attack (TIA), and cerebral infarction without residual deficits: Secondary | ICD-10-CM | POA: Insufficient documentation

## 2012-01-18 DIAGNOSIS — E119 Type 2 diabetes mellitus without complications: Secondary | ICD-10-CM | POA: Insufficient documentation

## 2012-01-18 DIAGNOSIS — Y831 Surgical operation with implant of artificial internal device as the cause of abnormal reaction of the patient, or of later complication, without mention of misadventure at the time of the procedure: Secondary | ICD-10-CM | POA: Insufficient documentation

## 2012-01-18 HISTORY — DX: Unspecified convulsions: R56.9

## 2012-01-18 HISTORY — DX: Acute myocardial infarction, unspecified: I21.9

## 2012-01-18 HISTORY — DX: Unspecified osteoarthritis, unspecified site: M19.90

## 2012-01-18 HISTORY — DX: Retinal hemorrhage, bilateral: H35.63

## 2012-01-18 LAB — CBC
HCT: 31 % — ABNORMAL LOW (ref 36.0–46.0)
RDW: 19.4 % — ABNORMAL HIGH (ref 11.5–15.5)
WBC: 8.4 10*3/uL (ref 4.0–10.5)

## 2012-01-18 LAB — URINALYSIS, ROUTINE W REFLEX MICROSCOPIC
Bilirubin Urine: NEGATIVE
Hgb urine dipstick: NEGATIVE
Ketones, ur: NEGATIVE mg/dL
Protein, ur: 100 mg/dL — AB
Urobilinogen, UA: 0.2 mg/dL (ref 0.0–1.0)

## 2012-01-18 LAB — DIFFERENTIAL
Basophils Absolute: 0 10*3/uL (ref 0.0–0.1)
Lymphocytes Relative: 35 % (ref 12–46)
Monocytes Absolute: 0.7 10*3/uL (ref 0.1–1.0)
Neutro Abs: 4.6 10*3/uL (ref 1.7–7.7)

## 2012-01-18 LAB — PROTIME-INR: Prothrombin Time: 13.1 seconds (ref 11.6–15.2)

## 2012-01-18 LAB — URINE MICROSCOPIC-ADD ON

## 2012-01-18 LAB — BASIC METABOLIC PANEL
Chloride: 98 mEq/L (ref 96–112)
Creatinine, Ser: 1.31 mg/dL — ABNORMAL HIGH (ref 0.50–1.10)
GFR calc Af Amer: 44 mL/min — ABNORMAL LOW (ref >90)
Potassium: 4.7 mEq/L (ref 3.5–5.1)
Sodium: 137 mEq/L (ref 135–145)

## 2012-01-18 LAB — SURGICAL PCR SCREEN: Staphylococcus aureus: NEGATIVE

## 2012-01-18 LAB — APTT: aPTT: 27 seconds (ref 24–37)

## 2012-01-18 NOTE — Pre-Procedure Instructions (Signed)
PT'S LAST EKG REPORT 10/02/11 [facility] [place] ON THIS CHART AND IN EPIC. MOST RECENT CARDIOLOGY OFFICE NOTES 10/20/11 DR. ARIDA-[facility] [place] --ON THIS CHART AND IN EPIC. PT HAS CLEARANCE FOR SURGERY FROM DR. ARIDA AND FROM HER MEDICAL DOCTOR -DR. VYAS--CLEARANCES WERE FAXED BY DR. Nilsa Nutting OFFICE AND ON THIS CHART. PT'S LAST CXR WAS 10/02/11 AT [facility] IN [place]-ABNORMAL--CHEST XRAY WILL BE REPEATED TODAY AT [facility]

## 2012-01-18 NOTE — Patient Instructions (Signed)
20 [name]  01/18/2012   Your procedure is scheduled on:  Monday 1/28  AT 5:10 PM  Report to [facility] at 2:30 PM.  Call this number if you have problems the morning of surgery: [phone]   Remember:DO NOT TAKE ANY DIABETIC MEDICATIONS THE AM OF YOUR SURGERY.   Do not eat food AFTER MIDNIGHT THE NIGHT BEFORE YOUR SURGERY.  May have clear liquids TO DRINK FROM MIDNIGHT UNTIL 11:OO AM THE DAY OF YOUR SURGERY---BUT NOTHING TO DRINK AFTER 11:OO AM.  Clear liquids include soda, tea, black coffee, apple or grape juice, WATER.  Take these medicines the morning of surgery with A SIP OF WATER: ALLOPURINOL, ATENOLOL, LAMICTAL, OMEPRAZOLE   Do not wear jewelry, make-up or nail polish.  Do not wear lotions, powders, or perfumes.  Do not shave 48 hours prior to surgery.  Do not bring valuables to the hospital.  Contacts, dentures or bridgework may not be worn into surgery.  Leave suitcase in the car. After surgery it may be brought to your room.  For patients admitted to the hospital, checkout time is 11:00 AM the day of discharge.   Patients discharged the day of surgery will not be allowed to drive home.    Special Instructions: CHG Shower Use Special Wash: 1/2 bottle night before surgery and 1/2 bottle morning of surgery.   Please read over the following fact sheets that you were given: Blood Transfusion Information and MRSA Information AND INCENTIVE SPIROMETER INFORMATION

## 2012-01-19 LAB — BODY FLUID CULTURE: Organism ID, Bacteria: NO GROWTH

## 2012-01-21 LAB — ANAEROBIC CULTURE: Gram Stain: NONE SEEN

## 2012-01-21 NOTE — H&P (Signed)
[name] is an 78 y.o. female.    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 female complaining of left hip pain for about 18 weeks. She had a left hip hemi-arthroplasty about 25 weeks ago, she was ok for 7 weeks and then started to have problems with pain.Pain had continually increased since that time. X-rays show a left hip hemi-arthroplasty.  Pt has tried various conservative treatments which have failed to alleviate their symptoms. Various options are discussed with the patient. Risks, benefits and expectations were discussed with the patient. Patient understand the risks, benefits and expectations and wishes to proceed with surgery.   PCP:  Ignatius Specking., MD, MD  D/C Plans:  SNF/Rehab - [facility] in [place] preferred  Post-op Meds:  No Rx given  Tranexamic Acid:  Not to be given  PMH: Past Medical History  Diagnosis Date  . Hypertension   . Gout   . GERD (gastroesophageal reflux disease)   . Hyperkalemia   . CHF (congestive heart failure)     EF 48%  . Hyperlipidemia   . Coronary artery disease   . Diabetes mellitus     oral meds - no insulin  . Myocardial infarction     1997  . Seizures     2001 and poss seizure aug 2012--pt fell-suffered broken hip  . Arthritis     s/p left  hip orif --has a lot of pain in hip  . Osteoporosis   . Retinal hemorrhage, both eyes     states laser of both eyes in the past  . Stroke     1980 and then 2 strokes 2012  residual effects--rt sided facial weakness and rt hand weakness and  rt foot "turns over"    DR. Sandria Manly IS PT'S NEUROLOGIST    PSH: Past Surgical History  Procedure Date  . Coronary artery bypass graft     1997  . Knee surgery     bilateral  . Abdominal hysterectomy   . Cardiac catheterization     11/2010-patent grafts  . Rt foot surgery     big toe removed and other surgeries on rt foot because of infection  . Left hip hemi-arthroplasty 07-31-2011   SURGERY AT [facility] FOR FRACTURE LEFT FEMORAL NECK    Social History:  reports that she has never smoked. She has never used smokeless tobacco. She reports that she does not drink alcohol or use illicit drugs.  Allergies:  Allergies  Allergen Reactions  . Ace Inhibitors     Hyperkalemia  . Cephalexin     Reaction unknown-but on pt's list of medications that she was told she was very allergic to.  . Furosemide Other (See Comments)    presyncope  . Levaquin Swelling  . Bactrim Rash  . Ciprocin-Fluocin-Procin (Fluocinolone Acetonide) Rash  . Ciprofloxacin Rash  . Codeine Rash  . Morphine And Related Rash  . Pentazocine Rash  . Promethazine Swelling and Rash  . Sulfa Antibiotics Swelling and Rash  . Trimethoprim Swelling and Rash    Medications: No current facility-administered medications for this encounter.   Current Outpatient Prescriptions  Medication Sig Dispense Refill  . acetaminophen (TYLENOL) 500 MG tablet Take 1,000 mg by mouth every 6 (six) hours as needed. Pain       . AGGRENOX 25-200 MG per 12 hr capsule Take 1 capsule by mouth 2 (two) times daily at 10 AM and 5 PM.       .  allopurinol (ZYLOPRIM) 300 MG tablet Take 300 mg by mouth daily.       Marland Kitchen atenolol (TENORMIN) 50 MG tablet Take 50 mg by mouth 2 (two) times daily.      . Biotin 1000 MCG tablet Take 1,000 mcg by mouth daily.       . Calcium-Magnesium-Vitamin D (CITRACAL CALCIUM+D) 600-40-500 MG-MG-UNIT TB24 Take 2 tablets by mouth 2 (two) times daily.       . cholecalciferol (VITAMIN D) 1000 UNITS tablet Take 1,000 Units by mouth daily.      Marland Kitchen desoximetasone (TOPICORT) 0.25 % cream Apply 1 application topically 2 (two) times daily as needed. Itching       . diphenhydramine-acetaminophen (TYLENOL PM) 25-500 MG TABS Take 1 tablet by mouth at bedtime.       . fish oil-omega-3 fatty acids 1000 MG capsule Take 2 g by mouth daily.       Marland Kitchen glyBURIDE (DIABETA) 5 MG tablet Take 5 mg by mouth 2 (two) times daily with a meal.        . lamoTRIgine (LAMICTAL) 100 MG tablet Take 100 mg by mouth 2 (two) times daily.       . metFORMIN (GLUCOPHAGE) 500 MG tablet Take 1,000 mg by mouth 2 (two) times daily with a meal.      . methocarbamol (ROBAXIN) 500 MG tablet Take 500 mg by mouth 3 (three) times daily as needed. Muscle spasm       . omeprazole (PRILOSEC) 20 MG capsule Take 20 mg by mouth 2 (two) times daily.       . RED YEAST RICE EXTRACT PO Take 600 mg by mouth daily.       Marland Kitchen tetrahydrozoline 0.05 % ophthalmic solution Place 1 drop into both eyes 2 (two) times daily. Same as Visine eye drops (over the counter)      . torsemide (DEMADEX) 20 MG tablet Take 10-20 mg by mouth Daily. Pt takes a 0.5 tablet every day but if swelling will take a whole tablet.         ROS: Review of Systems  Constitutional: Negative.   HENT: Negative.   Eyes: Negative.   Respiratory: Negative.   Cardiovascular: Positive for palpitations.  Gastrointestinal: Positive for constipation.  Genitourinary: Negative.   Musculoskeletal: Positive for joint pain.  Skin: Negative.   Neurological: Negative.   Endo/Heme/Allergies: Negative.   Psychiatric/Behavioral: Negative.      Physican Exam:  Physical Exam  Constitutional: She is oriented to person, place, and time and well-developed, well-nourished, and in no distress.  HENT:  Head: Normocephalic and atraumatic.  Nose: Nose normal.  Mouth/Throat: Oropharynx is clear and moist.  Eyes: Pupils are equal, round, and reactive to light.  Neck: Neck supple. No JVD present. No tracheal deviation present. No thyromegaly present.  Cardiovascular: Normal rate, regular rhythm, normal heart sounds and intact distal pulses.   Pulmonary/Chest: Effort normal and breath sounds normal. No stridor.  Abdominal: Soft. There is no tenderness. There is no guarding.  Musculoskeletal:       Left hip: She exhibits decreased range of motion, decreased strength, tenderness and bony tenderness. She exhibits no  swelling, no crepitus, no deformity and no laceration.  Lymphadenopathy:    She has no cervical adenopathy.  Neurological: She is alert and oriented to person, place, and time.  Skin: Skin is warm and dry.  Psychiatric: Affect normal.     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 Risks benefits and expectation were discussed with the patient. Patient understand risks, benefits and expectation and wishes to proceed.   Anastasio Auerbach Deshana Rominger   PAC  01/21/2012, 5:17 PM

## 2012-01-23 ENCOUNTER — Encounter (HOSPITAL_COMMUNITY): Payer: Self-pay | Admitting: Anesthesiology

## 2012-01-23 ENCOUNTER — Inpatient Hospital Stay (HOSPITAL_COMMUNITY): Payer: Medicare Other | Admitting: Anesthesiology

## 2012-01-23 ENCOUNTER — Inpatient Hospital Stay (HOSPITAL_COMMUNITY): Payer: Medicare Other

## 2012-01-23 ENCOUNTER — Encounter (HOSPITAL_COMMUNITY): Payer: Self-pay | Admitting: *Deleted

## 2012-01-23 ENCOUNTER — Encounter (HOSPITAL_COMMUNITY)
Admission: RE | Disposition: A | Discharge status: Skilled Nursing Facility | Payer: Self-pay | Source: Ambulatory Visit | Attending: Orthopedic Surgery

## 2012-01-23 ENCOUNTER — Inpatient Hospital Stay (HOSPITAL_COMMUNITY)
Admission: RE | Admit: 2012-01-23 | Discharge: 2012-01-26 | DRG: 468 | Disposition: A | Discharge status: Skilled Nursing Facility | Payer: Medicare Other | Source: Ambulatory Visit | Attending: Orthopedic Surgery | Admitting: Orthopedic Surgery

## 2012-01-23 DIAGNOSIS — E875 Hyperkalemia: Secondary | ICD-10-CM | POA: Diagnosis present

## 2012-01-23 DIAGNOSIS — I509 Heart failure, unspecified: Secondary | ICD-10-CM | POA: Diagnosis present

## 2012-01-23 DIAGNOSIS — K219 Gastro-esophageal reflux disease without esophagitis: Secondary | ICD-10-CM | POA: Diagnosis present

## 2012-01-23 DIAGNOSIS — T84099A Other mechanical complication of unspecified internal joint prosthesis, initial encounter: Principal | ICD-10-CM | POA: Diagnosis present

## 2012-01-23 DIAGNOSIS — Z79899 Other long term (current) drug therapy: Secondary | ICD-10-CM

## 2012-01-23 DIAGNOSIS — G40909 Epilepsy, unspecified, not intractable, without status epilepticus: Secondary | ICD-10-CM | POA: Diagnosis present

## 2012-01-23 DIAGNOSIS — Y831 Surgical operation with implant of artificial internal device as the cause of abnormal reaction of the patient, or of later complication, without mention of misadventure at the time of the procedure: Secondary | ICD-10-CM | POA: Diagnosis present

## 2012-01-23 DIAGNOSIS — Z96649 Presence of unspecified artificial hip joint: Secondary | ICD-10-CM

## 2012-01-23 DIAGNOSIS — M810 Age-related osteoporosis without current pathological fracture: Secondary | ICD-10-CM | POA: Diagnosis present

## 2012-01-23 DIAGNOSIS — I252 Old myocardial infarction: Secondary | ICD-10-CM

## 2012-01-23 DIAGNOSIS — E785 Hyperlipidemia, unspecified: Secondary | ICD-10-CM | POA: Diagnosis present

## 2012-01-23 DIAGNOSIS — I69998 Other sequelae following unspecified cerebrovascular disease: Secondary | ICD-10-CM

## 2012-01-23 DIAGNOSIS — M109 Gout, unspecified: Secondary | ICD-10-CM | POA: Diagnosis present

## 2012-01-23 DIAGNOSIS — Z951 Presence of aortocoronary bypass graft: Secondary | ICD-10-CM

## 2012-01-23 DIAGNOSIS — R29898 Other symptoms and signs involving the musculoskeletal system: Secondary | ICD-10-CM | POA: Diagnosis present

## 2012-01-23 DIAGNOSIS — I69992 Facial weakness following unspecified cerebrovascular disease: Secondary | ICD-10-CM

## 2012-01-23 DIAGNOSIS — I2510 Atherosclerotic heart disease of native coronary artery without angina pectoris: Secondary | ICD-10-CM | POA: Diagnosis present

## 2012-01-23 DIAGNOSIS — I10 Essential (primary) hypertension: Secondary | ICD-10-CM | POA: Diagnosis present

## 2012-01-23 HISTORY — PX: TOTAL HIP REVISION: SHX763

## 2012-01-23 LAB — GLUCOSE, CAPILLARY: Glucose-Capillary: 129 mg/dL — ABNORMAL HIGH (ref 70–99)

## 2012-01-23 SURGERY — TOTAL HIP REVISION
Anesthesia: General | Site: Hip | Laterality: Left | Wound class: Clean

## 2012-01-23 MED ORDER — ATENOLOL 50 MG PO TABS
50.0000 mg | ORAL_TABLET | Freq: Two times a day (BID) | ORAL | Status: DC
  Administered 2012-01-23 – 2012-01-26 (×6): 50 mg via ORAL
  Filled 2012-01-23 (×10): qty 1

## 2012-01-23 MED ORDER — ONDANSETRON HCL 4 MG/2ML IJ SOLN
INTRAMUSCULAR | Status: DC | PRN
  Administered 2012-01-23: 4 mg via INTRAVENOUS

## 2012-01-23 MED ORDER — CHLORHEXIDINE GLUCONATE 4 % EX LIQD: 60.0000 mL | Freq: Once | CUTANEOUS | Status: DC

## 2012-01-23 MED ORDER — ALLOPURINOL 300 MG PO TABS
300.0000 mg | ORAL_TABLET | Freq: Every day | ORAL | Status: DC
  Administered 2012-01-24 – 2012-01-26 (×3): 300 mg via ORAL
  Filled 2012-01-23 (×4): qty 1

## 2012-01-23 MED ORDER — PHENOL 1.4 % MT LIQD
1.0000 | OROMUCOSAL | Status: DC | PRN
  Filled 2012-01-23: qty 177

## 2012-01-23 MED ORDER — LAMOTRIGINE 100 MG PO TABS
100.0000 mg | ORAL_TABLET | Freq: Two times a day (BID) | ORAL | Status: DC
  Administered 2012-01-23 – 2012-01-26 (×6): 100 mg via ORAL
  Filled 2012-01-23 (×9): qty 1

## 2012-01-23 MED ORDER — ACETAMINOPHEN 650 MG RE SUPP: 650.0000 mg | Freq: Four times a day (QID) | RECTAL | Status: DC | PRN

## 2012-01-23 MED ORDER — CLINDAMYCIN PHOSPHATE 600 MG/50ML IV SOLN
600.0000 mg | Freq: Once | INTRAVENOUS | Status: AC
  Administered 2012-01-23: 600 mg via INTRAVENOUS

## 2012-01-23 MED ORDER — MEPERIDINE HCL 50 MG/ML IJ SOLN: 6.2500 mg | INTRAMUSCULAR | Status: DC | PRN

## 2012-01-23 MED ORDER — METHOCARBAMOL 500 MG PO TABS: 500.0000 mg | ORAL_TABLET | Freq: Four times a day (QID) | ORAL | Status: DC | PRN

## 2012-01-23 MED ORDER — MENTHOL 3 MG MT LOZG
1.0000 | LOZENGE | OROMUCOSAL | Status: DC | PRN
  Filled 2012-01-23: qty 9

## 2012-01-23 MED ORDER — PROMETHAZINE HCL 25 MG/ML IJ SOLN: 6.2500 mg | INTRAMUSCULAR | Status: DC | PRN

## 2012-01-23 MED ORDER — CLINDAMYCIN PHOSPHATE 600 MG/50ML IV SOLN
600.0000 mg | Freq: Four times a day (QID) | INTRAVENOUS | Status: AC
  Administered 2012-01-24 – 2012-01-25 (×3): 600 mg via INTRAVENOUS
  Filled 2012-01-23 (×3): qty 50

## 2012-01-23 MED ORDER — HYDROMORPHONE HCL PF 1 MG/ML IJ SOLN
INTRAMUSCULAR | Status: AC
  Filled 2012-01-23: qty 1

## 2012-01-23 MED ORDER — BISACODYL 5 MG PO TBEC: 5.0000 mg | DELAYED_RELEASE_TABLET | Freq: Every day | ORAL | Status: DC | PRN

## 2012-01-23 MED ORDER — METFORMIN HCL 500 MG PO TABS
1000.0000 mg | ORAL_TABLET | Freq: Two times a day (BID) | ORAL | Status: DC
  Administered 2012-01-24 – 2012-01-26 (×6): 1000 mg via ORAL
  Filled 2012-01-23 (×8): qty 2

## 2012-01-23 MED ORDER — PANTOPRAZOLE SODIUM 40 MG PO TBEC
40.0000 mg | DELAYED_RELEASE_TABLET | Freq: Two times a day (BID) | ORAL | Status: DC
  Administered 2012-01-24 – 2012-01-26 (×5): 40 mg via ORAL
  Filled 2012-01-23 (×8): qty 1

## 2012-01-23 MED ORDER — ACETAMINOPHEN 325 MG PO TABS
650.0000 mg | ORAL_TABLET | Freq: Four times a day (QID) | ORAL | Status: DC | PRN
  Administered 2012-01-23 – 2012-01-24 (×2): 650 mg via ORAL
  Filled 2012-01-23 (×2): qty 2

## 2012-01-23 MED ORDER — NEOSTIGMINE METHYLSULFATE 1 MG/ML IJ SOLN
INTRAMUSCULAR | Status: DC | PRN
  Administered 2012-01-23: 3.5 mg via INTRAVENOUS

## 2012-01-23 MED ORDER — LACTATED RINGERS IV SOLN: INTRAVENOUS | Status: DC

## 2012-01-23 MED ORDER — FENTANYL CITRATE 0.05 MG/ML IJ SOLN
INTRAMUSCULAR | Status: DC | PRN
  Administered 2012-01-23 (×2): 100 ug via INTRAVENOUS
  Administered 2012-01-23: 50 ug via INTRAVENOUS

## 2012-01-23 MED ORDER — ONDANSETRON HCL 4 MG/2ML IJ SOLN: 4.0000 mg | Freq: Four times a day (QID) | INTRAMUSCULAR | Status: DC | PRN

## 2012-01-23 MED ORDER — DIPHENHYDRAMINE HCL 25 MG PO CAPS
25.0000 mg | ORAL_CAPSULE | Freq: Four times a day (QID) | ORAL | Status: DC | PRN
  Administered 2012-01-23 – 2012-01-24 (×2): 25 mg via ORAL
  Filled 2012-01-23 (×2): qty 1

## 2012-01-23 MED ORDER — FERROUS SULFATE 325 (65 FE) MG PO TABS
325.0000 mg | ORAL_TABLET | Freq: Three times a day (TID) | ORAL | Status: DC
  Administered 2012-01-24 – 2012-01-26 (×8): 325 mg via ORAL
  Filled 2012-01-23 (×12): qty 1

## 2012-01-23 MED ORDER — PROPOFOL 10 MG/ML IV BOLUS
INTRAVENOUS | Status: DC | PRN
  Administered 2012-01-23: 150 mg via INTRAVENOUS

## 2012-01-23 MED ORDER — GLYCOPYRROLATE 0.2 MG/ML IJ SOLN
INTRAMUSCULAR | Status: DC | PRN
  Administered 2012-01-23: .5 mg via INTRAVENOUS

## 2012-01-23 MED ORDER — HYDROMORPHONE HCL PF 1 MG/ML IJ SOLN
0.5000 mg | INTRAMUSCULAR | Status: DC | PRN
  Administered 2012-01-24: 1 mg via INTRAVENOUS
  Administered 2012-01-25: 0.5 mg via INTRAVENOUS
  Filled 2012-01-23 (×2): qty 1

## 2012-01-23 MED ORDER — ONDANSETRON HCL 4 MG PO TABS
4.0000 mg | ORAL_TABLET | Freq: Four times a day (QID) | ORAL | Status: DC | PRN
  Administered 2012-01-23: 4 mg via ORAL
  Filled 2012-01-23: qty 1

## 2012-01-23 MED ORDER — ZOLPIDEM TARTRATE 5 MG PO TABS: 5.0000 mg | ORAL_TABLET | Freq: Every evening | ORAL | Status: DC | PRN

## 2012-01-23 MED ORDER — RIVAROXABAN 10 MG PO TABS
10.0000 mg | ORAL_TABLET | ORAL | Status: DC
  Administered 2012-01-24 – 2012-01-26 (×3): 10 mg via ORAL
  Filled 2012-01-23 (×4): qty 1

## 2012-01-23 MED ORDER — HYDROMORPHONE HCL PF 1 MG/ML IJ SOLN
0.2500 mg | INTRAMUSCULAR | Status: DC | PRN
  Administered 2012-01-23 (×2): 0.5 mg via INTRAVENOUS

## 2012-01-23 MED ORDER — POLYETHYLENE GLYCOL 3350 17 G PO PACK
17.0000 g | PACK | Freq: Two times a day (BID) | ORAL | Status: DC
  Administered 2012-01-23 – 2012-01-26 (×6): 17 g via ORAL
  Filled 2012-01-23 (×9): qty 1

## 2012-01-23 MED ORDER — FLEET ENEMA 7-19 GM/118ML RE ENEM: 1.0000 | ENEMA | Freq: Once | RECTAL | Status: AC | PRN

## 2012-01-23 MED ORDER — DOCUSATE SODIUM 100 MG PO CAPS
100.0000 mg | ORAL_CAPSULE | Freq: Two times a day (BID) | ORAL | Status: DC
  Administered 2012-01-23 – 2012-01-26 (×6): 100 mg via ORAL
  Filled 2012-01-23 (×10): qty 1

## 2012-01-23 MED ORDER — HYDROCODONE-ACETAMINOPHEN 7.5-325 MG PO TABS
1.0000 | ORAL_TABLET | ORAL | Status: DC
  Administered 2012-01-24: 1 via ORAL
  Administered 2012-01-24: 2 via ORAL
  Administered 2012-01-24 – 2012-01-25 (×3): 1 via ORAL
  Filled 2012-01-23 (×2): qty 1
  Filled 2012-01-23: qty 2
  Filled 2012-01-23 (×2): qty 1

## 2012-01-23 MED ORDER — METHOCARBAMOL 100 MG/ML IJ SOLN
500.0000 mg | Freq: Four times a day (QID) | INTRAVENOUS | Status: DC | PRN
  Filled 2012-01-23: qty 5

## 2012-01-23 MED ORDER — LACTATED RINGERS IV SOLN
INTRAVENOUS | Status: DC | PRN
  Administered 2012-01-23 (×2): via INTRAVENOUS

## 2012-01-23 MED ORDER — METOCLOPRAMIDE HCL 5 MG/ML IJ SOLN
5.0000 mg | Freq: Three times a day (TID) | INTRAMUSCULAR | Status: DC | PRN
Start: 2012-01-23 — End: 2012-01-26

## 2012-01-23 MED ORDER — 0.9 % SODIUM CHLORIDE (POUR BTL) OPTIME
TOPICAL | Status: DC | PRN
  Administered 2012-01-23: 1000 mL

## 2012-01-23 MED ORDER — BIOTIN 1000 MCG PO TABS: 1000.0000 ug | ORAL_TABLET | Freq: Every day | ORAL | Status: DC

## 2012-01-23 MED ORDER — ALUM & MAG HYDROXIDE-SIMETH 200-200-20 MG/5ML PO SUSP: 30.0000 mL | ORAL | Status: DC | PRN

## 2012-01-23 MED ORDER — GLYBURIDE 5 MG PO TABS
5.0000 mg | ORAL_TABLET | Freq: Two times a day (BID) | ORAL | Status: DC
  Administered 2012-01-24 – 2012-01-26 (×6): 5 mg via ORAL
  Filled 2012-01-23 (×8): qty 1

## 2012-01-23 MED ORDER — LIDOCAINE HCL (CARDIAC) 20 MG/ML IV SOLN
INTRAVENOUS | Status: DC | PRN
  Administered 2012-01-23: 75 mg via INTRAVENOUS

## 2012-01-23 MED ORDER — ACETAMINOPHEN 10 MG/ML IV SOLN
INTRAVENOUS | Status: DC | PRN
  Administered 2012-01-23: 1000 mg via INTRAVENOUS

## 2012-01-23 MED ORDER — ROCURONIUM BROMIDE 100 MG/10ML IV SOLN
INTRAVENOUS | Status: DC | PRN
  Administered 2012-01-23: 50 mg via INTRAVENOUS

## 2012-01-23 MED ORDER — TORSEMIDE 10 MG PO TABS
10.0000 mg | ORAL_TABLET | Freq: Once | ORAL | Status: AC
  Administered 2012-01-23: 10 mg via ORAL
  Filled 2012-01-23: qty 1

## 2012-01-23 MED ORDER — TETRAHYDROZOLINE HCL 0.05 % OP SOLN
1.0000 [drp] | Freq: Two times a day (BID) | OPHTHALMIC | Status: DC
  Administered 2012-01-23 – 2012-01-26 (×6): 1 [drp] via OPHTHALMIC
  Filled 2012-01-23: qty 15

## 2012-01-23 MED ORDER — SODIUM CHLORIDE 0.9 % IV SOLN
100.0000 mL/h | INTRAVENOUS | Status: DC
  Administered 2012-01-23: 100 mL/h via INTRAVENOUS
  Filled 2012-01-23 (×10): qty 1000

## 2012-01-23 MED ORDER — METOCLOPRAMIDE HCL 10 MG PO TABS: 5.0000 mg | ORAL_TABLET | Freq: Three times a day (TID) | ORAL | Status: DC | PRN

## 2012-01-23 SURGICAL SUPPLY — 59 items
ADH SKN CLS APL DERMABOND .7 (GAUZE/BANDAGES/DRESSINGS) ×1
BAG SPEC THK2 15X12 ZIP CLS (MISCELLANEOUS) ×1
BAG ZIPLOCK 12X15 (MISCELLANEOUS) ×2 IMPLANT
BLADE SAW SGTL 18X1.27X75 (BLADE) ×1 IMPLANT
BRUSH FEMORAL CANAL (MISCELLANEOUS) ×2 IMPLANT
CLOTH BEACON ORANGE TIMEOUT ST (SAFETY) ×2 IMPLANT
CUP ACETBLR 52 OD PINNACLE (Hips) ×1 IMPLANT
DERMABOND ADVANCED (GAUZE/BANDAGES/DRESSINGS) ×1
DERMABOND ADVANCED .7 DNX12 (GAUZE/BANDAGES/DRESSINGS) IMPLANT
DRAPE INCISE IOBAN 85X60 (DRAPES) ×2 IMPLANT
DRAPE ORTHO SPLIT 77X108 STRL (DRAPES) ×4
DRAPE POUCH INSTRU U-SHP 10X18 (DRAPES) ×2 IMPLANT
DRAPE SURG 17X11 SM STRL (DRAPES) ×2 IMPLANT
DRAPE SURG ORHT 6 SPLT 77X108 (DRAPES) ×2 IMPLANT
DRAPE U-SHAPE 47X51 STRL (DRAPES) ×2 IMPLANT
DRSG AQUACEL AG ADV 3.5X10 (GAUZE/BANDAGES/DRESSINGS) ×1 IMPLANT
DRSG EMULSION OIL 3X16 NADH (GAUZE/BANDAGES/DRESSINGS) ×2 IMPLANT
DRSG MEPILEX BORDER 4X4 (GAUZE/BANDAGES/DRESSINGS) ×2 IMPLANT
DRSG MEPILEX BORDER 4X8 (GAUZE/BANDAGES/DRESSINGS) ×2 IMPLANT
DRSG TEGADERM 4X4.75 (GAUZE/BANDAGES/DRESSINGS) ×1 IMPLANT
DURAPREP 26ML APPLICATOR (WOUND CARE) ×2 IMPLANT
ELECT BLADE TIP CTD 4 INCH (ELECTRODE) ×2 IMPLANT
ELECT REM PT RETURN 9FT ADLT (ELECTROSURGICAL) ×2
ELECTRODE REM PT RTRN 9FT ADLT (ELECTROSURGICAL) ×1 IMPLANT
ELIMINATOR HOLE APEX DEPUY (Hips) ×1 IMPLANT
EVACUATOR 1/8 PVC DRAIN (DRAIN) ×2 IMPLANT
FACESHIELD LNG OPTICON STERILE (SAFETY) ×8 IMPLANT
FEMORAL HEAD METAL ON METAL (Head) ×1 IMPLANT
GAUZE SPONGE 2X2 8PLY STRL LF (GAUZE/BANDAGES/DRESSINGS) IMPLANT
GLOVE BIOGEL PI IND STRL 7.5 (GLOVE) ×1 IMPLANT
GLOVE BIOGEL PI IND STRL 8 (GLOVE) ×1 IMPLANT
GLOVE BIOGEL PI INDICATOR 7.5 (GLOVE) ×1
GLOVE BIOGEL PI INDICATOR 8 (GLOVE) ×1
GLOVE ORTHO TXT STRL SZ7.5 (GLOVE) ×4 IMPLANT
GLOVE SURG ORTHO 8.0 STRL STRW (GLOVE) ×2 IMPLANT
GOWN BRE IMP PREV XXLGXLNG (GOWN DISPOSABLE) ×2 IMPLANT
GOWN STRL NON-REIN LRG LVL3 (GOWN DISPOSABLE) ×1 IMPLANT
HANDPIECE INTERPULSE COAX TIP (DISPOSABLE) ×2
KIT BASIN OR (CUSTOM PROCEDURE TRAY) ×2 IMPLANT
LINER NEUTRAL 52X36MM PLUS 4 (Liner) ×1 IMPLANT
MANIFOLD NEPTUNE II (INSTRUMENTS) ×2 IMPLANT
NS IRRIG 1000ML POUR BTL (IV SOLUTION) ×3 IMPLANT
PACK TOTAL JOINT (CUSTOM PROCEDURE TRAY) ×2 IMPLANT
POSITIONER SURGICAL ARM (MISCELLANEOUS) ×2 IMPLANT
PRESSURIZER FEMORAL UNIV (MISCELLANEOUS) ×2 IMPLANT
SCREW 6.5MMX40MM (Screw) ×1 IMPLANT
SET HNDPC FAN SPRY TIP SCT (DISPOSABLE) ×1 IMPLANT
SPONGE GAUZE 2X2 STER 10/PKG (GAUZE/BANDAGES/DRESSINGS) ×1
SPONGE LAP 18X18 X RAY DECT (DISPOSABLE) ×2 IMPLANT
SPONGE LAP 4X18 X RAY DECT (DISPOSABLE) ×1 IMPLANT
STAPLER VISISTAT 35W (STAPLE) ×2 IMPLANT
SUCTION FRAZIER TIP 10 FR DISP (SUCTIONS) ×2 IMPLANT
SUT VIC AB 1 CT1 36 (SUTURE) ×6 IMPLANT
SUT VIC AB 2-0 CT1 27 (SUTURE) ×6
SUT VIC AB 2-0 CT1 TAPERPNT 27 (SUTURE) ×3 IMPLANT
TOWEL OR 17X26 10 PK STRL BLUE (TOWEL DISPOSABLE) ×5 IMPLANT
TOWER CARTRIDGE SMART MIX (DISPOSABLE) ×2 IMPLANT
TRAY FOLEY CATH 14FRSI W/METER (CATHETERS) ×2 IMPLANT
WATER STERILE IRR 1500ML POUR (IV SOLUTION) ×2 IMPLANT

## 2012-01-23 NOTE — Anesthesia Postprocedure Evaluation (Signed)
  Anesthesia Post-op Note  Patient: [name]  Procedure(s) Performed:  TOTAL HIP REVISION - Conversion of  Previous Surgery to a Left Total Hip  Patient Location: PACU  Anesthesia Type: General  Level of Consciousness: awake and alert   Airway and Oxygen Therapy: Patient Spontanous Breathing  Post-op Pain: mild  Post-op Assessment: Post-op Vital signs reviewed, Patient's Cardiovascular Status Stable, Respiratory Function Stable, Patent Airway and No signs of Nausea or vomiting  Post-op Vital Signs: stable  Complications: No apparent anesthesia complications

## 2012-01-23 NOTE — Preoperative (Signed)
Beta Blockers   Reason not to administer Beta Blockers:Took Atenolol 50 mg at 0830 today.

## 2012-01-23 NOTE — Brief Op Note (Signed)
01/23/2012  6:46 PM  PATIENT:  [name]  78 y.o. female  PRE-OPERATIVE DIAGNOSIS:  Failed Left Hip Hemi Arthroplasty  POST-OPERATIVE DIAGNOSIS:  Failed Left Hip Hemi Arthroplasty  PROCEDURE:  Procedure(s): Left TOTAL HIP REVISION, DePuy acetabular shell 52mm, 36+4 liner, 36+5 metal ball  SURGEON:  Surgeon(s): Shelda Pal, MD  PHYSICIAN ASSISTANT: Lanney Gins, PA-C  ANESTHESIA:   general  EBL:  Total I/O In: 1400 [I.V.:1400] Out: 450 [Urine:250; Blood:200]  BLOOD ADMINISTERED:none  DRAINS: (1 medium) Hemovact drain(s) in the left hip with  Suction Open   LOCAL MEDICATIONS USED:  NONE  SPECIMEN:  No Specimen  DISPOSITION OF SPECIMEN:  N/A  COUNTS:  YES  TOURNIQUET:  * No tourniquets in log *  DICTATION: .Other Dictation: Dictation Number [id number]  PLAN OF CARE: Admit to inpatient   PATIENT DISPOSITION:  PACU - hemodynamically stable.   Delay start of Pharmacological VTE agent (>24hrs) due to surgical blood loss or risk of bleeding:  {YES/NO/NOT APPLICABLE:20182

## 2012-01-23 NOTE — Interval H&P Note (Signed)
History and Physical Interval Note:  01/23/2012 4:11 PM  [name]  has presented today for surgery, with the diagnosis of Failed Left Hip Hemi Arthroplasty  The various methods of treatment have been discussed with the patient and family. After consideration of risks, benefits and other options for treatment, the patient has consented to  Procedure(s): LEFT TOTAL HIP REVISION as a surgical intervention .  The patients' history has been reviewed, patient examined, no change in status, stable for surgery.  I have reviewed the patients' chart and labs.  Questions were answered to the patient's satisfaction.     Shelda Pal

## 2012-01-23 NOTE — Transfer of Care (Signed)
Immediate Anesthesia Transfer of Care Note  Patient: [name]  Procedure(s) Performed:  TOTAL HIP REVISION - Conversion of  Previous Surgery to a Left Total Hip  Patient Location: PACU  Anesthesia Type: General  Level of Consciousness: awake, sedated and patient cooperative  Airway & Oxygen Therapy: Patient Spontanous Breathing and Patient connected to face mask oxygen  Post-op Assessment: Report given to PACU RN and Post -op Vital signs reviewed and stable  Post vital signs: Reviewed and stable  Complications: No apparent anesthesia complications

## 2012-01-23 NOTE — Anesthesia Preprocedure Evaluation (Signed)
Anesthesia Evaluation  Patient identified by MRN, date of birth, ID band Patient awake    Reviewed: Allergy & Precautions, H&P , NPO status , Patient's Chart, lab work & pertinent test results  Airway Mallampati: II TM Distance: >3 FB Neck ROM: Full    Dental No notable dental hx.    Pulmonary neg pulmonary ROS,  clear to auscultation  Pulmonary exam normal       Cardiovascular hypertension, Pt. on medications + CAD (s/p cabg 1994), + Past MI, +CHF and neg cardio ROS Regular Normal    Neuro/Psych Seizures -,  CVA, Residual Symptoms Negative Neurological ROS  Negative Psych ROS   GI/Hepatic negative GI ROS, Neg liver ROS,   Endo/Other  Negative Endocrine ROSDiabetes mellitus-, Type 2, Oral Hypoglycemic Agents  Renal/GU negative Renal ROS  Genitourinary negative   Musculoskeletal negative musculoskeletal ROS (+)   Abdominal   Peds negative pediatric ROS (+)  Hematology negative hematology ROS (+)   Anesthesia Other Findings   Reproductive/Obstetrics negative OB ROS                           Anesthesia Physical Anesthesia Plan  ASA: III  Anesthesia Plan: General   Post-op Pain Management:    Induction: Intravenous  Airway Management Planned: Oral ETT  Additional Equipment:   Intra-op Plan:   Post-operative Plan: Extubation in OR  Informed Consent: I have reviewed the patients History and Physical, chart, labs and discussed the procedure including the risks, benefits and alternatives for the proposed anesthesia with the patient or authorized representative who has indicated his/her understanding and acceptance.   Dental advisory given  Plan Discussed with: CRNA  Anesthesia Plan Comments:         Anesthesia Quick Evaluation

## 2012-01-24 LAB — CBC
Hemoglobin: 8 g/dL — ABNORMAL LOW (ref 12.0–15.0)
MCHC: 31.4 g/dL (ref 30.0–36.0)
RDW: 18.8 % — ABNORMAL HIGH (ref 11.5–15.5)

## 2012-01-24 LAB — BASIC METABOLIC PANEL
GFR calc Af Amer: 48 mL/min — ABNORMAL LOW (ref >90)
GFR calc non Af Amer: 42 mL/min — ABNORMAL LOW (ref >90)
Potassium: 5.4 mEq/L — ABNORMAL HIGH (ref 3.5–5.1)
Sodium: 136 mEq/L (ref 135–145)

## 2012-01-24 LAB — GLUCOSE, CAPILLARY: Glucose-Capillary: 145 mg/dL — ABNORMAL HIGH (ref 70–99)

## 2012-01-24 MED ORDER — INSULIN ASPART 100 UNIT/ML ~~LOC~~ SOLN
0.0000 [IU] | Freq: Three times a day (TID) | SUBCUTANEOUS | Status: DC
  Administered 2012-01-24: 3 [IU] via SUBCUTANEOUS
  Administered 2012-01-25: 11 [IU] via SUBCUTANEOUS
  Administered 2012-01-25 (×2): 3 [IU] via SUBCUTANEOUS
  Administered 2012-01-26: 8 [IU] via SUBCUTANEOUS
  Filled 2012-01-24: qty 3

## 2012-01-24 NOTE — Progress Notes (Signed)
Patient assisted to bathroom to void, but only able to urinate 30cc. Bladder scan shows 270cc of urine. I/O cath done and 200cc of clear yellow urine obtained. Patient tolerated procedure well.

## 2012-01-24 NOTE — Progress Notes (Signed)
Utilization review completed.  

## 2012-01-24 NOTE — Evaluation (Signed)
Physical Therapy Evaluation Patient Details Name: [name] MRN: [id number] DOB: 12-27-1930 Today's Date: 01/24/2012  Problem List:  Patient Active Problem List  Diagnoses  . Coronary artery disease  . Congestive heart failure (CHF)  . Hyperlipidemia  . S/P revision of left total hip    Past Medical History:  Past Medical History  Diagnosis Date  . Hypertension   . Gout   . GERD (gastroesophageal reflux disease)   . Hyperkalemia   . CHF (congestive heart failure)     EF 48%  . Hyperlipidemia   . Coronary artery disease   . Diabetes mellitus     oral meds - no insulin  . Myocardial infarction     1997  . Seizures     2001 and poss seizure aug 2012--pt fell-suffered broken hip  . Arthritis     s/p left  hip orif --has a lot of pain in hip  . Osteoporosis   . Retinal hemorrhage, both eyes     states laser of both eyes in the past  . Stroke     1980 and then 2 strokes 2012  residual effects--rt sided facial weakness and rt hand weakness and  rt foot "turns over"    DR. Sandria Manly IS PT'S NEUROLOGIST   Past Surgical History:  Past Surgical History  Procedure Date  . Coronary artery bypass graft     1997  . Knee surgery     bilateral  . Abdominal hysterectomy   . Cardiac catheterization     11/2010-patent grafts  . Rt foot surgery     big toe removed and other surgeries on rt foot because of infection  . Left hip hemi-arthroplasty 07-31-2011    SURGERY AT [facility] FOR FRACTURE LEFT FEMORAL NECK    PT Assessment/Plan/Recommendation PT Assessment Clinical Impression Statement: Pt with L THR revision presents with decreased L LE ROM/strength and ltd functional mobility.  Pt will benefit from skilled PT intervention to maximizze IND for d/c to next venue of care PT Recommendation/Assessment: Patient will need skilled PT in the acute care venue PT Problem List: Decreased strength;Decreased range of motion;Decreased activity tolerance;Decreased balance;Decreased  mobility;Decreased knowledge of use of DME;Decreased knowledge of precautions;Pain PT Therapy Diagnosis : Difficulty walking PT Plan PT Frequency: 7X/week PT Recommendation Recommendations for Other Services: OT consult Follow Up Recommendations: Skilled nursing facility Equipment Recommended: Defer to next venue PT Goals  Acute Rehab PT Goals PT Goal Formulation: With patient Time For Goal Achievement: 7 days Pt will go Supine/Side to Sit: with supervision PT Goal: Supine/Side to Sit - Progress: Goal set today Pt will go Sit to Supine/Side: with supervision PT Goal: Sit to Supine/Side - Progress: Goal set today Pt will go Sit to Stand: with supervision PT Goal: Sit to Stand - Progress: Goal set today Pt will go Stand to Sit: with supervision;with upper extremity assist PT Goal: Stand to Sit - Progress: Goal set today Pt will Ambulate: 51 - 150 feet;with min assist;with rolling walker PT Goal: Ambulate - Progress: Goal set today  PT Evaluation Precautions/Restrictions  Precautions Precautions: Posterior Hip Restrictions Weight Bearing Restrictions: No Other Position/Activity Restrictions: WBAT Prior Functioning    Prior Function Level of Independence: Requires assistive device for independence;Needs assistance with ADLs;Needs assistance with homemaking Cognition Cognition Arousal/Alertness: Awake/alert Overall Cognitive Status: Appears within functional limits for tasks assessed Orientation Level: Oriented X4 Sensation/Coordination Coordination Gross Motor Movements are Fluid and Coordinated: Yes Extremity Assessment RUE Assessment RUE Assessment: Within Functional Limits LUE Assessment  LUE Assessment: Within Functional Limits RLE Assessment RLE Assessment: Within Functional Limits LLE Assessment LLE Assessment: Exceptions to [facility] (70degrees hip flex - ltd by discomfort.  2+/5 hip strength) Mobility (including Balance) Bed Mobility Bed Mobility: Yes Supine to Sit:  3: Mod assist Supine to Sit Details (indicate cue type and reason): cues for sequence and use of UEs Transfers Transfers: Yes Sit to Stand: 1: +2 Total assist;With upper extremity assist;From bed (pt 60%) Sit to Stand Details (indicate cue type and reason): cues for use of UEs and for LE position Stand to Sit: 1: +2 Total assist;With upper extremity assist;With armrests;To chair/3-in-1 (pt 60%) Stand to Sit Details: cues for use of UEs and for LE position Ambulation/Gait Ambulation/Gait: Yes Ambulation/Gait Assistance: 1: +2 Total assist Ambulation/Gait Assistance Details (indicate cue type and reason): pt 60% with assist to correct for retropulsion and cues for sequence, posture and position from RW Ambulation Distance (Feet): 3 Feet Assistive device: Rolling walker Gait Pattern: Step-to pattern    Exercise  Total Joint Exercises Ankle Circles/Pumps: AROM;Both;10 reps;Supine Quad Sets: AROM;Both;10 reps;Supine Heel Slides: AAROM;10 reps;Left;Supine Hip ABduction/ADduction: AAROM;10 reps;Left;Supine End of Session PT - End of Session Equipment Utilized During Treatment: Gait belt Activity Tolerance: Patient limited by fatigue (and c/o dizziness) Patient left: in chair;with call bell in reach;with family/visitor present Nurse Communication: Mobility status for transfers;Mobility status for ambulation;Other (comment) (BP) General Behavior During Session: [facility] for tasks performed Cognition: [facility] for tasks performed  Rexanna Louthan 01/24/2012, 12:32 PM

## 2012-01-24 NOTE — Progress Notes (Signed)
Subjective: 1 Day Post-Op Procedure(s) (LRB): TOTAL HIP REVISION (Left)   Patient reports pain as mild. Some pain throughout the night. Took some analgesic this morning and feels better. No other events.  Objective:   VITALS:   Filed Vitals:   01/24/12 0847  BP: 119/74  Pulse: 67  Temp: 97.5 F (36.4 C)  Resp: 12    Neurovascular intact Dorsiflexion/Plantar flexion intact Incision: dressing C/D/I No cellulitis present Compartment soft  LABS  Basename 01/24/12 0336  HGB 8.0*  HCT 25.5*  WBC 8.6  PLT 215     Basename 01/24/12 0336  NA 136  K 5.4*  BUN 26*  CREATININE 1.21*  GLUCOSE 159*     Assessment/Plan: 1 Day Post-Op Procedure(s) (LRB): TOTAL HIP REVISION (Left)  Foley cath d/c'ed HV drain d/c'ed Advance diet Up with therapy D/C IV fluids SW consult for SNF/Rehab placement   Anastasio Auerbach. Dandra Shambaugh   PAC  01/24/2012, 8:57 AM

## 2012-01-24 NOTE — Progress Notes (Signed)
CSW assisting with d/c planning. Pt would like to have her ST rehab at [facility]. Unclear, at this point if bed will be available at pt's time of d/c. Pt willing to allow CSW to initiate SNF search in [facility]. Will follow to continue assisting with SNF placement.

## 2012-01-24 NOTE — Progress Notes (Signed)
Physical Therapy Treatment Patient Details Name: [name] MRN: [id number] DOB: [birth year] Today's Date: 01/24/2012  PT Assessment/Plan  PT - Assessment/Plan Comments on Treatment Session: marked improvement from am session with decreased retropulsion and increased activity tolerance PT Plan: Discharge plan remains appropriate PT Frequency: 7X/week Recommendations for Other Services: OT consult Follow Up Recommendations: Skilled nursing facility Equipment Recommended: Defer to next venue PT Goals  Acute Rehab PT Goals PT Goal Formulation: With patient Time For Goal Achievement: 7 days Pt will go Supine/Side to Sit: with supervision Pt will go Sit to Supine/Side: with supervision PT Goal: Sit to Supine/Side - Progress: Progressing toward goal Pt will go Sit to Stand: with supervision PT Goal: Sit to Stand - Progress: Progressing toward goal Pt will go Stand to Sit: with supervision;with upper extremity assist PT Goal: Stand to Sit - Progress: Progressing toward goal Pt will Ambulate: 51 - 150 feet;with min assist;with rolling walker PT Goal: Ambulate - Progress: Progressing toward goal  PT Treatment Precautions/Restrictions  Precautions Precautions: Posterior Hip Restrictions Weight Bearing Restrictions: No Other Position/Activity Restrictions: WBAT Mobility (including Balance) Bed Mobility Sit to Supine: 1: +2 Total assist Sit to Supine - Details (indicate cue type and reason): cues for use of UEs and sequence - pt 50% Transfers Sit to Stand: 1: +2 Total assist;With upper extremity assist;From chair/3-in-1 (pt 60%) Sit to Stand Details (indicate cue type and reason): cues for use of UEs and for LE position Stand to Sit: 1: +2 Total assist (pt 60%) Stand to Sit Details: cues for use of UEs and for LE position Ambulation/Gait Ambulation/Gait Assistance: 1: +2 Total assist Ambulation/Gait Assistance Details (indicate cue type and reason): pt 70% - cues for sequence,  posture, position from RW Ambulation Distance (Feet): 20 Feet Assistive device: Rolling walker Gait Pattern: Step-to pattern    Exercise    End of Session PT - End of Session Equipment Utilized During Treatment: Gait belt Activity Tolerance: Patient limited by fatigue Patient left: in bed;with call bell in reach;with family/visitor present Nurse Communication: Mobility status for transfers;Mobility status for ambulation;Other (comment) General Behavior During Session: [facility] for tasks performed Cognition: [facility] for tasks performed  Ami Thornsberry 01/24/2012, 3:50 PM

## 2012-01-24 NOTE — Progress Notes (Signed)
PHARMACIST - PHYSICIAN ORDER COMMUNICATION  CONCERNING: P&T Medication Policy on Herbal Medications  DESCRIPTION:  This patient's order for: Biotin  has been noted.  This product(s) is classified as an "herbal" or natural product. Due to a lack of definitive safety studies or FDA approval, nonstandard manufacturing practices, plus the potential risk of unknown drug-drug interactions while on inpatient medications, the Pharmacy and Therapeutics Committee does not permit the use of "herbal" or natural products of this type within [facility].   ACTION TAKEN: The pharmacy department is unable to verify this order at this time and your patient has been informed of this safety policy. Please reevaluate patient's clinical condition at discharge and address if the herbal or natural product(s) should be resumed at that time.  Pharmacy [phone]

## 2012-01-24 NOTE — Op Note (Signed)
[name], [name] NO.:  [id number]  MEDICAL RECORD NO.:  [id number]  LOCATION:  1618                         FACILITY:  [facility]  PHYSICIAN:  Madlyn Frankel. Charlann Boxer, M.D.  DATE OF BIRTH:  October 22, 1931  DATE OF PROCEDURE:  01/23/2012 DATE OF DISCHARGE:                              OPERATIVE REPORT   PREOPERATIVE DIAGNOSIS:  Failed left hip hemiarthroplasty with concern for acetabular wear versus a loose femoral component.  POSTOPERATIVE DIAGNOSIS:  Failed left hip hemiarthroplasty with concern for acetabular wear versus a loose femoral component.  FINDINGS:  The patient was noted to have significant acetabular erosion of her cartilage with exposed bone.  There was normal synovial fluid, no concern for infection.  Next, the femoral component was noted to be very stable as I tried to remove it with significant amount of force and there was no movement of the stem, thus it was left.  PROCEDURE:  Revision left total hip replacement with removal of the unipolar ball replacement with a 52 mm Pinnacle cup, 36 +4 neutral AltrX liner, a 36 +5 metal aSphere ball.  SURGEON:  Madlyn Frankel. Charlann Boxer, MD  ASSISTANT:  Lanney Gins, PA-C  Please note that [name] was present for the entirety of the case from preoperative position, perioperative retractor management, general facilitation, case management of the lower extremity, retractor management as well as primary wound closure.  ANESTHESIA:  General.  SPECIMENS:  None.  COMPLICATIONS:  None.  DRAINS:  One medium Hemovac.  BLOOD LOSS:  About 200 cc.  INDICATIONS FOR PROCEDURE:  [name] is a 78 year old female who presented to the office for evaluation of her left hip.  Approximately 5 months after a left hip hemiarthroplasty performed on August 2012, though she had initially done well, she had progressive pain with activity.  She had start-up type pain in the groin and anterior aspect of the hip.  I reviewed with  her the possible etiologies including a loose femoral stem versus the femoral head now rubbing against articular cartilage and then acetabulum.  After reviewing all risks including risk of infection, DVT, component failure, dislocation, given the revision setting as well as the need for future surgery, consent was obtained for benefit of pain relief.  PROCEDURE IN DETAIL:  The patient was brought to operative theater. Once adequate anesthesia, preoperative antibiotics, clindamycin administered due to penicillin allergy, she was positioned into the right lateral decubitus position with left side up.  The left lower extremity was then prepped and draped in sterile fashion.  A time-out was performed identifying the patient, planned procedure, and extremity.  A lateral incision was made through her old incision which was slightly curvilinear posterior, sharp dissection was carried to the iliotibial band and gluteal fascia.  Soft tissue planes created.  A posterior approach was then obtained through the iliotibial band and gluteal fascia.  The L capsulotomy through the pseudocapsule was made, encountering clear synovial fluid.  Following initial exposure including some scar debridement deep down into the hip, I was able to then dislocate the hip.  At this point, the femoral component did not appear to be grossly unstable or loose.  I removed the femoral head, the  unipolar ball from the trunnion.  At this point, based on the preliminary concern about a loose femoral stem after debriding the proximal femur and freeing it up enough, I still did not get any movement or evidence of obvious loosening.  I did use an osteotome on the proximal aspect of the proximal femur and then applied the S-ROM loop extractor.  I applied a fairly significant amount of stress trying to back it out of the femur and the femoral component did not move.  We even tried to apply an antegrade force with this  in conjunction with the loop extractor and still did not get any movement in the stem.  Given these findings, I determined the femoral component was stable enough that should not be causing her any pain based on this assessment.  At this point, following elevating the soft tissues off the lateral ilium, I was able to place the trunnion of the femoral component onto the ilium and placed retractor anteriorly.  After labral debridement as well as foveal debridement, identified a significant erosion of cartilage in the medial portion predominantly of her acetabular cartilage with exposed bone that appeared to be fairly aggravated.  Once I had acetabulum exposed, I began reaming with a 44 reamer and reamed up to 51 reamer with good bony bed preparation.  We chose a 52 Pinnacle cup, was then impacted at about 35-40 degrees of abduction, 20 degrees of forward flexion.  Single cancellous screw was used to help support this initial fixation.  At this point, I did place a trial liner not knowing the orientation of the femoral stem.  I did a trial reduction now placed a 36 +1.5 ball onto the trunnion.  The combined anteversion was about 45 degrees. There was no evidence any impingement.  There was a few millimeters of shuck, however, in extension.  Given these findings, I removed the trial liner.  I chose a 36 +4 neutral AltrX liner which was impacted into the acetabulum with good secure rim fit.  I chose based on the trial reduction, a 36 +5 aSphere ball which was impacted on a clean and dry trunnion.  The hip was reduced, found to be very stable throughout range of motion without evidence of impingement.  The hip had been irrigated throughout the case.  Again at this point, I re-approximated pseudocapsular tissue to itself using #1 Vicryl.  I placed a medium Hemovac drain deep into the joint and out through the anterior aspect of the iliotibial band. At this point, the iliotibial band and  gluteal fascia were re- approximated using #1 Vicryl.  The remainder of the wound was closed with 2-0 Vicryl and running 4-0 Monocryl.  The hip was cleaned, dried, and dressed sterilely using Dermabond and Aquacel dressing.  Drain site dressed separately.  The patient was brought to recovery room, extubated in stable condition, tolerated the procedure well.     Madlyn Frankel Charlann Boxer, M.D.     MDO/MEDQ  D:  01/23/2012  T:  01/24/2012  Job:  [id number]

## 2012-01-25 ENCOUNTER — Encounter (HOSPITAL_COMMUNITY): Payer: Self-pay | Admitting: Orthopedic Surgery

## 2012-01-25 LAB — BASIC METABOLIC PANEL
BUN: 30 mg/dL — ABNORMAL HIGH (ref 6–23)
Calcium: 8.5 mg/dL (ref 8.4–10.5)
Creatinine, Ser: 1.45 mg/dL — ABNORMAL HIGH (ref 0.50–1.10)
GFR calc Af Amer: 39 mL/min — ABNORMAL LOW (ref >90)
GFR calc non Af Amer: 34 mL/min — ABNORMAL LOW (ref >90)
Potassium: 4.5 mEq/L (ref 3.5–5.1)

## 2012-01-25 LAB — GLUCOSE, CAPILLARY
Glucose-Capillary: 182 mg/dL — ABNORMAL HIGH (ref 70–99)
Glucose-Capillary: 155 mg/dL — ABNORMAL HIGH (ref 70–99)

## 2012-01-25 LAB — CBC
HCT: 25 % — ABNORMAL LOW (ref 36.0–46.0)
MCH: 26.8 pg (ref 26.0–34.0)
MCHC: 31.2 g/dL (ref 30.0–36.0)
MCV: 85.9 fL (ref 78.0–100.0)
RDW: 18.3 % — ABNORMAL HIGH (ref 11.5–15.5)

## 2012-01-25 LAB — PREPARE RBC (CROSSMATCH)

## 2012-01-25 MED ORDER — FUROSEMIDE 10 MG/ML IJ SOLN
20.0000 mg | Freq: Once | INTRAMUSCULAR | Status: DC
  Filled 2012-01-25: qty 2

## 2012-01-25 MED ORDER — FUROSEMIDE 10 MG/ML IJ SOLN
10.0000 mg | Freq: Once | INTRAMUSCULAR | Status: AC
  Administered 2012-01-26: 10 mg via INTRAVENOUS
  Filled 2012-01-25: qty 1

## 2012-01-25 MED ORDER — TRAMADOL HCL 50 MG PO TABS: 50.0000 mg | ORAL_TABLET | Freq: Four times a day (QID) | ORAL | Status: DC | PRN

## 2012-01-25 MED ORDER — HYDROCODONE-ACETAMINOPHEN 5-325 MG PO TABS
1.0000 | ORAL_TABLET | ORAL | Status: DC | PRN
  Administered 2012-01-26 (×2): 1 via ORAL
  Filled 2012-01-25 (×2): qty 1

## 2012-01-25 MED ORDER — TRAMADOL HCL 50 MG PO TABS: 50.0000 mg | ORAL_TABLET | Freq: Four times a day (QID) | ORAL | Status: DC

## 2012-01-25 NOTE — Progress Notes (Signed)
Multiple attempts made to get the patient to void using the bedside commode, but patient was still unable to void at the time. Bladder scan shows 339cc of urine.14Fr foley catheter inserted due to patient inability to void. Clear yellow urine returned. Patient tolerated the procedure well.

## 2012-01-25 NOTE — Progress Notes (Signed)
Subjective: 2 Days Post-Op Procedure(s) (LRB): TOTAL HIP REVISION (Left)   Patient reports pain as mild. Really groggy this morning. Will change her pain medication frequency. Otherwise no events.  Objective:   VITALS:   Filed Vitals:   01/25/12 1010  BP: 129/65  Pulse:   Temp:   Resp:     Neurovascular intact Dorsiflexion/Plantar flexion intact Incision: dressing C/D/I No cellulitis present Compartment soft  LABS  Basename 01/25/12 0438 01/24/12 0336  HGB 7.8* 8.0*  HCT 25.0* 25.5*  WBC 10.5 8.6  PLT 220 215     Basename 01/25/12 0438 01/24/12 0336  NA 128* 136  K 4.5 5.4*  BUN 30* 26*  CREATININE 1.45* 1.21*  GLUCOSE 166* 159*     Assessment/Plan: 2 Days Post-Op Procedure(s) (LRB): TOTAL HIP REVISION (Left)  Analgesic medication to prn and dosing change Up with therapy Approaching discharge, which will be to rehab/SNF when eventually ready.   Anastasio Auerbach Nikeisha Klutz   PAC  01/25/2012, 10:33 AM

## 2012-01-25 NOTE — Progress Notes (Signed)
Occupational Therapy Spoke with PT who states pt was groggy and having difficulty following along with bed exercises this am. Will try back later today as able. Judithann Sauger OTR/L [phone] 01/25/2012

## 2012-01-25 NOTE — Progress Notes (Signed)
PT deferred this pm secondary to pt lethargy.  Pt slurring and mumbling words, difficult to rouse and following no cues.  Assisted CNA to reposition pt.  Will follow in am

## 2012-01-25 NOTE — Progress Notes (Signed)
Pt has ST SNF bed available at [facility] on Thursday, if ready for d/c. CSW will assistw ith d/c planning to SNF.

## 2012-01-26 LAB — GLUCOSE, CAPILLARY
Glucose-Capillary: 92 mg/dL (ref 70–99)
Glucose-Capillary: 285 mg/dL — ABNORMAL HIGH (ref 70–99)

## 2012-01-26 LAB — HEMOGLOBIN AND HEMATOCRIT, BLOOD: Hemoglobin: 10.3 g/dL — ABNORMAL LOW (ref 12.0–15.0)

## 2012-01-26 LAB — TYPE AND SCREEN
ABO/RH(D): B POS
Antibody Screen: NEGATIVE

## 2012-01-26 MED ORDER — FERROUS SULFATE 325 (65 FE) MG PO TABS: 325.0000 mg | ORAL_TABLET | Freq: Three times a day (TID) | ORAL | Status: DC

## 2012-01-26 MED ORDER — POLYETHYLENE GLYCOL 3350 17 G PO PACK: 17.0000 g | PACK | Freq: Two times a day (BID) | ORAL | Status: AC

## 2012-01-26 MED ORDER — TRAMADOL HCL 50 MG PO TABS: 50.0000 mg | ORAL_TABLET | Freq: Four times a day (QID) | ORAL | Status: AC | PRN

## 2012-01-26 MED ORDER — METHOCARBAMOL 500 MG PO TABS: 500.0000 mg | ORAL_TABLET | Freq: Four times a day (QID) | ORAL | Status: AC | PRN

## 2012-01-26 MED ORDER — DSS 100 MG PO CAPS: 100.0000 mg | ORAL_CAPSULE | Freq: Two times a day (BID) | ORAL | Status: AC

## 2012-01-26 MED ORDER — DIPHENHYDRAMINE HCL 25 MG PO CAPS: 25.0000 mg | ORAL_CAPSULE | Freq: Four times a day (QID) | ORAL | Status: AC | PRN

## 2012-01-26 MED ORDER — ACETAMINOPHEN 325 MG PO TABS: 650.0000 mg | ORAL_TABLET | Freq: Four times a day (QID) | ORAL | Status: DC | PRN

## 2012-01-26 NOTE — Progress Notes (Signed)
Ambulance here for transport [facility].  Pt stable and ready for d/c. Pt was able to void on her own this afternoon without difficulty.

## 2012-01-26 NOTE — Discharge Summary (Signed)
Physician Discharge Summary  Patient ID: [name] MRN: [id number] DOB/AGE: 1931-10-04 78 y.o.  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 female complaining of left hip pain for about 18 weeks. She had a left hip hemi-arthroplasty about 25 weeks ago, she was ok for 7 weeks and then started to have problems with pain.Pain had continually increased since that time. X-rays show a left hip hemi-arthroplasty. Pt has tried various conservative treatments which have failed to alleviate their symptoms. Various options are discussed with the patient. Risks, benefits and expectations were discussed with the patient. Patient understand the risks, benefits and expectations and wishes to proceed with surgery.   PCP: Ignatius Specking., MD, MD   Discharged Condition: good  Hospital Course:  Patient underwent the above stated procedure on 01/23/2012. Patient tolerated the procedure well and brought to the recovery room in good condition and subsequently to the floor.  POD #1 BP: 119/74 ; Pulse: 67 ; Temp: 97.5 F (36.4 C) ; Resp: 12  Pt's foley was removed, as well as the hemovac drain removed. IV was changed to a saline lock. Patient reports pain as mild. Some pain throughout the night. Took some analgesic this morning and feels better. No other events. Neurovascular intact, dorsiflexion/plantar flexion intact, incision: dressing C/D/I, no cellulitis present and compartment soft.  LABS    Basename  01/24/12 0336   HGB  8.0*   HCT  25.5*    POD #2  BP: 124/67 ; Pulse: 86 ; Temp: 100.1 F (37.8 C) ; Resp: 14  Patient reports pain as mild. Really groggy this morning. Will change her pain medication frequency. Otherwise no events. Throughout the day she was feeling more confused and weak, she was transfused with 2 units of PRBC. Neurovascular intact, dorsiflexion/plantar flexion intact, incision: dressing C/D/I, no cellulitis present and compartment soft.  LABS  Basename  01/25/12 0438    HGB  7.8*    HCT  25.0*     POD #3  BP: 149/90 ; Pulse: 75 ; Temp: 99.4 F (37.4 C) ; Resp: 16  Patient reports pain as mild. Pt still a little lethargic, but is feeling better. No other events. Ready to be discharged to SNF. Neurovascular intact, dorsiflexion/plantar flexion intact, incision: dressing C/D/I, no cellulitis present and compartment soft.  LABS  Basename  01/26/12 0801     HGB  10.3*     HCT  30.8*      Discharge Exam: Extremities: Homans sign is negative, no sign of DVT, no edema, redness or tenderness in the calves or thighs and no ulcers, gangrene or trophic changes  Disposition: Skilled Nursing Facility   with follow up in 2 weeks  Follow-up Information    Follow up with OLIN,Riannon Mukherjee D in 2 weeks.   Contact information:   [facility] [address] [place] [address] [phone]          Discharge Orders  Future Appointments: Provider: Department: Dept Phone: Center:   04/27/2012 1:30 PM Sherrie George, MD [facility] [phone] None     Future Orders Please Complete By Expires   Diet - low sodium heart healthy      Call MD / Call 911      Comments:   If you experience chest pain or shortness of breath, CALL 911 and be transported to the hospital emergency room.  If you develope a fever above 101 F, pus (white drainage) or increased drainage or redness at the wound, or calf pain, call your  surgeon's office.   Discharge instructions      Comments:   Maintain surgical dressing for 8 days, then replace with gauze and tape. Keep the area dry and clean until follow up. Follow up in 2 weeks at [facility]. Call with any questions or concerns.     Constipation Prevention      Comments:   Drink plenty of fluids.  Prune juice may be helpful.  You may use a stool softener, such as Colace (over the counter) 100 mg twice a day.  Use MiraLax (over the counter) for constipation as needed.   Increase activity slowly as tolerated      Weight Bearing as taught in Physical Therapy      Comments:   Use a walker or crutches as instructed.   Driving restrictions      Comments:   No driving for 4 weeks   Change dressing      Comments:   Maintain surgical dressing for 8 days, then replace with 4x4 guaze and tape. Keep the area dry and clean.   TED hose      Comments:   Use stockings (TED hose) for 2 weeks on both leg(s).  You may remove them at night for sleeping.      Current Discharge Medication List    START taking these medications   Details  diphenhydrAMINE (BENADRYL) 25 mg capsule Take 1 capsule (25 mg total) by mouth every 6 (six) hours as needed for itching, allergies or sleep.    docusate sodium 100 MG CAPS Take 100 mg by mouth 2 (two) times daily.    ferrous sulfate 325 (65 FE) MG tablet Take 1 tablet (325 mg total) by mouth 3 (three) times daily after meals.    polyethylene glycol (MIRALAX / GLYCOLAX) packet Take 17 g by mouth 2 (two) times daily.    traMADol (ULTRAM) 50 MG tablet Take 1 tablet (50 mg total) by mouth every 6 (six) hours as needed. Qty: 100 tablet, Refills: 0      CONTINUE these medications which have CHANGED   Details  acetaminophen (TYLENOL) 325 MG tablet Take 2 tablets (650 mg total) by mouth every 6 (six) hours as needed for pain (or Fever >/= 101).    methocarbamol (ROBAXIN) 500 MG tablet Take 1 tablet (500 mg total) by mouth every 6  (six) hours as needed (muscle spasms). Qty: 50 tablet, Refills: 0      CONTINUE these medications which have NOT CHANGED   Details  allopurinol (ZYLOPRIM) 300 MG tablet Take 300 mg by mouth daily.     atenolol (TENORMIN) 50 MG tablet Take 50 mg by mouth 2 (two) times daily.    diphenhydramine-acetaminophen (TYLENOL PM) 25-500 MG TABS Take 1 tablet by mouth at bedtime.     glyBURIDE (DIABETA) 5 MG tablet Take 5 mg by mouth 2 (two) times daily with a meal.  lamoTRIgine (LAMICTAL) 100 MG tablet Take 100 mg by mouth 2 (two) times daily.     metFORMIN (GLUCOPHAGE) 500 MG tablet Take 1,000 mg by mouth 2 (two) times daily with a meal.    omeprazole (PRILOSEC) 20 MG capsule Take 20 mg by mouth 2 (two) times daily.     tetrahydrozoline 0.05 % ophthalmic solution Place 1 drop into both eyes 2 (two) times daily. Same as Visine eye drops (over the counter)    torsemide (DEMADEX) 20 MG tablet Take 10-20 mg by mouth Daily. Pt takes a 0.5 tablet every day but if swelling will take a whole tablet.    AGGRENOX 25-200 MG per 12 hr capsule Take 1 capsule by mouth 2 (two) times daily at 10 AM and 5 PM.     Biotin 1000 MCG tablet Take 1,000 mcg by mouth daily.     Calcium-Magnesium-Vitamin D (CITRACAL CALCIUM+D) 600-40-500 MG-MG-UNIT TB24 Take 2 tablets by mouth 2 (two) times daily.     cholecalciferol (VITAMIN D) 1000 UNITS tablet Take 1,000 Units by mouth daily.    desoximetasone (TOPICORT) 0.25 % cream Apply 1 application topically 2 (two) times daily as needed. Itching     fish oil-omega-3 fatty acids 1000 MG capsule Take 2 g by mouth daily.     RED YEAST RICE EXTRACT PO Take 600 mg by mouth daily.           Signed: Anastasio Auerbach. Jerrell Mangel   PAC  01/26/2012, 9:32 AM

## 2012-01-26 NOTE — Progress Notes (Signed)
Subjective: 3 Days Post-Op Procedure(s) (LRB): TOTAL HIP REVISION (Left)   Patient reports pain as mild. Pt still a little lethargic, but is feeling better. No other events.  Objective:   VITALS:   Filed Vitals:   01/26/12 0601  BP: 149/90  Pulse: 75  Temp: 99.4 F (37.4 C)  Resp: 16    Neurovascular intact Dorsiflexion/Plantar flexion intact Incision: dressing C/D/I No cellulitis present Compartment soft  LABS  Basename 01/26/12 0801 01/25/12 0438 01/24/12 0336  HGB 10.3* 7.8* 8.0*  HCT 30.8* 25.0* 25.5*  WBC -- 10.5 8.6  PLT -- 220 215     Basename 01/25/12 0438 01/24/12 0336  NA 128* 136  K 4.5 5.4*  BUN 30* 26*  CREATININE 1.45* 1.21*  GLUCOSE 166* 159*     Assessment/Plan: 3 Days Post-Op Procedure(s) (LRB): TOTAL HIP REVISION (Left)   Up with therapy Discharge to SNF   Anastasio Auerbach. Kacen Mellinger   PAC  01/26/2012, 9:06 AM

## 2012-01-26 NOTE — Evaluation (Signed)
Occupational Therapy Evaluation Patient Details Name: [name] MRN: [id number] DOB: [birth year] Today's Date: 01/26/2012  Problem List:  Patient Active Problem List  Diagnoses  . Coronary artery disease  . Congestive heart failure (CHF)  . Hyperlipidemia  . S/P revision of left total hip    Past Medical History:  Past Medical History  Diagnosis Date  . Hypertension   . Gout   . GERD (gastroesophageal reflux disease)   . Hyperkalemia   . CHF (congestive heart failure)     EF 48%  . Hyperlipidemia   . Coronary artery disease   . Diabetes mellitus     oral meds - no insulin  . Myocardial infarction     1997  . Seizures     2001 and poss seizure aug 2012--pt fell-suffered broken hip  . Arthritis     s/p left  hip orif --has a lot of pain in hip  . Osteoporosis   . Retinal hemorrhage, both eyes     states laser of both eyes in the past  . Stroke     1980 and then 2 strokes 2012  residual effects--rt sided facial weakness and rt hand weakness and  rt foot "turns over"    DR. Sandria Manly IS PT'S NEUROLOGIST   Past Surgical History:  Past Surgical History  Procedure Date  . Coronary artery bypass graft     1997  . Knee surgery     bilateral  . Abdominal hysterectomy   . Cardiac catheterization     11/2010-patent grafts  . Rt foot surgery     big toe removed and other surgeries on rt foot because of infection  . Left hip hemi-arthroplasty 07-31-2011    SURGERY AT [facility] FOR FRACTURE LEFT FEMORAL NECK  . Total hip revision 01/23/2012    Procedure: TOTAL HIP REVISION;  Surgeon: Shelda Pal, MD;  Location: [facility];  Service: Orthopedics;  Laterality: Left;  Conversion of  Previous Surgery to a Left Total Hip  Time:9:34-10:02am Ev II, Ind 2 OT Assessment/Plan/Recommendation OT Assessment Clinical Impression Statement: Pt requires +1 total assist at this time for LB ADL's and selfcare tasks, she will benefit from SNF w/ OT to address ADL/selfcare deficits and increase  independence in these areas. Will follow acutely for OT. OT Recommendation/Assessment: Patient will need skilled OT in the acute care venue OT Problem List: Decreased activity tolerance;Impaired balance (sitting and/or standing);Decreased safety awareness;Decreased knowledge of use of DME or AE;Decreased knowledge of precautions;Pain;Decreased strength Barriers to Discharge: Decreased caregiver support OT Therapy Diagnosis : Generalized weakness;Acute pain OT Plan OT Frequency: Min 2X/week OT Treatment/Interventions: Self-care/ADL training;Therapeutic activities;Energy conservation;DME and/or AE instruction;Patient/family education OT Recommendation Follow Up Recommendations: Skilled nursing facility Equipment Recommended: Defer to next venue Individuals Consulted Consulted and Agree with Results and Recommendations: Patient OT Goals Acute Rehab OT Goals OT Goal Formulation: With patient Time For Goal Achievement: 7 days ADL Goals Pt Will Perform Grooming: with set-up;Sitting, chair;Sitting, edge of bed ADL Goal: Grooming - Progress: Goal set today Pt Will Perform Upper Body Bathing: with set-up;Sitting, chair;Sitting, edge of bed;Sitting at sink ADL Goal: Upper Body Bathing - Progress: Goal set today Pt Will Perform Lower Body Bathing: with set-up;with adaptive equipment;Sit to stand from chair;Sit to stand from bed;Sitting at sink;Standing at sink;with supervision ADL Goal: Lower Body Bathing - Progress: Goal set today Pt Will Perform Upper Body Dressing: with modified independence;with set-up;Sitting, chair;Sitting, bed ADL Goal: Upper Body Dressing - Progress: Goal set today Pt Will Perform  Lower Body Dressing: with min assist;with adaptive equipment;Sit to stand from chair;Sit to stand from bed ADL Goal: Lower Body Dressing - Progress: Goal set today Pt Will Transfer to Toilet: with supervision;Ambulation;with DME;3-in-1;Maintaining hip precautions;with min assist ADL Goal: Toilet  Transfer - Progress: Goal set today Pt Will Perform Toileting - Clothing Manipulation: with supervision;Sitting on 3-in-1 or toilet;Standing ADL Goal: Toileting - Clothing Manipulation - Progress: Goal set today Pt Will Perform Toileting - Hygiene: with modified independence;Sitting on 3-in-1 or toilet ADL Goal: Toileting - Hygiene - Progress: Goal set today  OT Evaluation Precautions/Restrictions  Precautions Precautions: Posterior Hip Precaution Comments: pt verbalizes all THP and WBAT status without cues Restrictions Weight Bearing Restrictions: No Other Position/Activity Restrictions: WBAT Prior Functioning   Prior Function Level of Independence: Requires assistive device for independence;Needs assistance with ADLs;Needs assistance with homemaking Comments: Pt plans on SNF Rehab when discharged from acute setting ADL ADL Eating/Feeding: Simulated;Modified independent Where Assessed - Eating/Feeding: Bed level Grooming: Performed;Set up Where Assessed - Grooming: Sitting, chair Upper Body Bathing: Simulated;Set up;Minimal assistance Where Assessed - Upper Body Bathing: Sitting, chair Lower Body Bathing: Simulated;+1 Total assistance Where Assessed - Lower Body Bathing: Sit to stand from chair Upper Body Dressing: Simulated;Supervision/safety Where Assessed - Upper Body Dressing: Sitting, chair Lower Body Dressing: Simulated;+1 Total assistance Where Assessed - Lower Body Dressing: Sit to stand from chair Toilet Transfer: Performed;+1 Total assistance Toilet Transfer Details (indicate cue type and reason): Pt requires increased time, VC's for safety and sequencing w/ RW. Encouraged to take larger steps (shuffling gait) w/ R LE as pt was noted to attept to slide one foot @ a time during anbulation activities. Toilet Transfer Method: Proofreader: Set designer - Clothing Manipulation: Performed;Maximal assistance Where Assessed - Agricultural consultant Manipulation: Standing;Sit to stand from 3-in-1 or toilet Toileting - Hygiene: Simulated;Moderate assistance;Maximal assistance Where Assessed - Toileting Hygiene: Sit on 3-in-1 or toilet Tub/Shower Transfer: Not assessed Equipment Used: Rolling walker;Other (comment) (3:1) Ambulation Related to ADLs: Pt requires increased time, Mod-max VC's for safety and sequencing w/ RW. Encouraged to take larger steps (secondary to shuffling gait) w/ R & L LE as pt was noted to attempt to slide one foot @ a time during ambulation activities. ADL Comments: Pt requires +1 total assist at this time for LB ADL's and selfcare tasks, she will benefit from SNF w/ OT to address deficits and increase independence in these areas. Will follow acutely for OT. Vision/Perception  Vision - History Visual History: Other (comment) (Diabetic "leaking" w/ laser treatment R>L eye) Patient Visual Report: No change from baseline Cognition Cognition Arousal/Alertness: Awake/alert Overall Cognitive Status: Appears within functional limits for tasks assessed Orientation Level: Oriented X4 Sensation/Coordination Sensation Light Touch: Appears Intact Coordination Gross Motor Movements are Fluid and Coordinated: Yes Extremity Assessment RUE Assessment RUE Assessment: Within Functional Limits LUE Assessment LUE Assessment: Within Functional Limits Mobility  Bed Mobility Bed Mobility: No Supine to Sit: 1: +1 Total assist Supine to Sit Details (indicate cue type and reason): cues for sequencing & UE hand placement/use Sit to Supine: Not Tested (comment) Transfers Transfers: Yes Sit to Stand: With upper extremity assist;From chair/3-in-1;1: +1 Total assist;From bed Sit to Stand Details (indicate cue type and reason): Cues for use of UE's & LE positioning; Pt=60% Stand to Sit: Patient percentage (comment);With armrests;To chair/3-in-1;1: +1 Total assist;With upper extremity assist Stand to Sit Details: Cues for hand  placement and LE positioning; pt=60%   End of Session OT - End of  Session Equipment Utilized During Treatment: Gait belt;Other (comment) (3:1; RW) Activity Tolerance: Patient tolerated treatment well Patient left: in chair;with call bell in reach General Behavior During Session: [facility] for tasks performed Cognition: [facility] for tasks performed   Alm Bustard 01/26/2012, 12:17 PM

## 2012-01-26 NOTE — Progress Notes (Signed)
Physical Therapy Treatment Patient Details Name: [name] MRN: [id number] DOB: 1931-06-19 Today's Date: 01/26/2012 1ta 1gt PT Assessment/Plan  PT - Assessment/Plan Comments on Treatment Session: pt progressing, possible D/C today PT Plan: Discharge plan remains appropriate Follow Up Recommendations: Skilled nursing facility Equipment Recommended: Defer to next venue PT Goals  Acute Rehab PT Goals Pt will go Sit to Stand: with supervision PT Goal: Sit to Stand - Progress: Progressing toward goal Pt will go Stand to Sit: with supervision;with upper extremity assist PT Goal: Stand to Sit - Progress: Progressing toward goal Pt will Ambulate: 51 - 150 feet;with min assist;with rolling walker PT Goal: Ambulate - Progress: Progressing toward goal  PT Treatment Precautions/Restrictions  Precautions Precautions: Posterior Hip Precaution Comments: pt verbalizes all THP and WBAT status without cues Restrictions Weight Bearing Restrictions: No Other Position/Activity Restrictions: WBAT Mobility (including Balance) Bed Mobility Bed Mobility: No Transfers Sit to Stand: 1: +2 Total assist;With upper extremity assist;From chair/3-in-1 Sit to Stand Details (indicate cue type and reason): cues for use of UEs and for LE position; pt=65% Stand to Sit: 1: +2 Total assist;Patient percentage (comment);With armrests;To chair/3-in-1 Stand to Sit Details: cues for use of UEs and for LE position; pt=65% Ambulation/Gait Ambulation/Gait Assistance: 1: +2 Total assist;Patient percentage (comment) Ambulation/Gait Assistance Details (indicate cue type and reason): pt60-70% with assist for balance and posture position Ambulation Distance (Feet):  (10' x 2) Assistive device: Rolling walker Gait Pattern: Step-to pattern Stairs: No    Exercise    End of Session PT - End of Session Activity Tolerance: Patient limited by fatigue Patient left: in chair;with call bell in reach;with family/visitor  present Nurse Communication: Mobility status for transfers;Mobility status for ambulation;Other (comment) General Behavior During Session: [facility] for tasks performed Cognition: [facility] for tasks performed  [facility] 01/26/2012, 12:08 PM

## 2012-01-27 LAB — TYPE AND SCREEN: Unit division: 0

## 2012-01-27 NOTE — Progress Notes (Signed)
  CARE MANAGEMENT NOTE 01/27/2012  Patient:  [name], [name]   Account Number:  [id number]  Date Initiated:  01/26/2012  Documentation initiated by:  Colleen Can  Subjective/Objective Assessment:   dx failed left hip hemiarthroplasty: revision left total hip with removal unipolar ball     Action/Plan:   Plan is for SNF placemnt as of 01/24/2012   Anticipated DC Date:  01/26/2012   Anticipated DC Plan:  SKILLED NURSING FACILITY  In-house referral  Clinical Social Worker      DC Planning Services  NA      [facility] Choice  NA   Choice offered to / List presented to:  NA   DME arranged  NA      DME agency  NA     HH arranged  NA      HH agency  NA   Status of service:  Completed, signed off Medicare Important Message given?  NA - LOS <3 / Initial given by admissions (If response is "NO", the following Medicare IM given date fields will be blank) Date Medicare IM given:   Date Additional Medicare IM given:    Discharge Disposition:  SKILLED NURSING FACILITY  Per UR Regulation:    Comments:

## 2012-04-01 DIAGNOSIS — R079 Chest pain, unspecified: Secondary | ICD-10-CM

## 2012-04-02 ENCOUNTER — Other Ambulatory Visit: Payer: Self-pay | Admitting: Physician Assistant

## 2012-04-02 ENCOUNTER — Encounter (HOSPITAL_COMMUNITY): Payer: Self-pay | Admitting: *Deleted

## 2012-04-02 ENCOUNTER — Other Ambulatory Visit: Payer: Self-pay | Admitting: Cardiovascular Disease

## 2012-04-02 ENCOUNTER — Inpatient Hospital Stay (HOSPITAL_COMMUNITY)
Admission: AD | Admit: 2012-04-02 | Discharge: 2012-04-05 | DRG: 281 | Disposition: A | Discharge status: Home / Self Care | Payer: Medicare Other | Source: Other Acute Inpatient Hospital | Attending: Cardiology | Admitting: Cardiology

## 2012-04-02 ENCOUNTER — Other Ambulatory Visit: Payer: Self-pay | Admitting: *Deleted

## 2012-04-02 ENCOUNTER — Other Ambulatory Visit: Payer: Self-pay | Admitting: Cardiology

## 2012-04-02 DIAGNOSIS — Z8673 Personal history of transient ischemic attack (TIA), and cerebral infarction without residual deficits: Secondary | ICD-10-CM

## 2012-04-02 DIAGNOSIS — Z7982 Long term (current) use of aspirin: Secondary | ICD-10-CM

## 2012-04-02 DIAGNOSIS — S98119A Complete traumatic amputation of unspecified great toe, initial encounter: Secondary | ICD-10-CM

## 2012-04-02 DIAGNOSIS — Z882 Allergy status to sulfonamides status: Secondary | ICD-10-CM

## 2012-04-02 DIAGNOSIS — I2510 Atherosclerotic heart disease of native coronary artery without angina pectoris: Secondary | ICD-10-CM | POA: Insufficient documentation

## 2012-04-02 DIAGNOSIS — M109 Gout, unspecified: Secondary | ICD-10-CM | POA: Diagnosis present

## 2012-04-02 DIAGNOSIS — I214 Non-ST elevation (NSTEMI) myocardial infarction: Secondary | ICD-10-CM

## 2012-04-02 DIAGNOSIS — I5042 Chronic combined systolic (congestive) and diastolic (congestive) heart failure: Secondary | ICD-10-CM | POA: Diagnosis present

## 2012-04-02 DIAGNOSIS — Z881 Allergy status to other antibiotic agents status: Secondary | ICD-10-CM

## 2012-04-02 DIAGNOSIS — I509 Heart failure, unspecified: Secondary | ICD-10-CM | POA: Insufficient documentation

## 2012-04-02 DIAGNOSIS — Z79899 Other long term (current) drug therapy: Secondary | ICD-10-CM

## 2012-04-02 DIAGNOSIS — E785 Hyperlipidemia, unspecified: Secondary | ICD-10-CM | POA: Insufficient documentation

## 2012-04-02 DIAGNOSIS — Z7902 Long term (current) use of antithrombotics/antiplatelets: Secondary | ICD-10-CM

## 2012-04-02 DIAGNOSIS — I25810 Atherosclerosis of coronary artery bypass graft(s) without angina pectoris: Secondary | ICD-10-CM | POA: Diagnosis present

## 2012-04-02 DIAGNOSIS — G40909 Epilepsy, unspecified, not intractable, without status epilepticus: Secondary | ICD-10-CM | POA: Diagnosis present

## 2012-04-02 DIAGNOSIS — Z888 Allergy status to other drugs, medicaments and biological substances status: Secondary | ICD-10-CM

## 2012-04-02 DIAGNOSIS — I252 Old myocardial infarction: Secondary | ICD-10-CM

## 2012-04-02 DIAGNOSIS — I10 Essential (primary) hypertension: Secondary | ICD-10-CM | POA: Diagnosis present

## 2012-04-02 DIAGNOSIS — I2582 Chronic total occlusion of coronary artery: Secondary | ICD-10-CM | POA: Diagnosis present

## 2012-04-02 DIAGNOSIS — I2589 Other forms of chronic ischemic heart disease: Secondary | ICD-10-CM | POA: Diagnosis present

## 2012-04-02 DIAGNOSIS — E119 Type 2 diabetes mellitus without complications: Secondary | ICD-10-CM | POA: Diagnosis present

## 2012-04-02 HISTORY — DX: Type 2 diabetes mellitus without complications: E11.9

## 2012-04-02 HISTORY — DX: Chronic combined systolic (congestive) and diastolic (congestive) heart failure: I50.42

## 2012-04-02 LAB — BASIC METABOLIC PANEL
BUN: 24 mg/dL — ABNORMAL HIGH (ref 6–23)
CO2: 29 mEq/L (ref 19–32)
Calcium: 9.6 mg/dL (ref 8.4–10.5)
GFR calc non Af Amer: 51 mL/min — ABNORMAL LOW (ref >90)
Glucose, Bld: 191 mg/dL — ABNORMAL HIGH (ref 70–99)
Potassium: 4.4 mEq/L (ref 3.5–5.1)

## 2012-04-02 LAB — CBC
Hemoglobin: 11.9 g/dL — ABNORMAL LOW (ref 12.0–15.0)
MCH: 29.8 pg (ref 26.0–34.0)
MCHC: 33 g/dL (ref 30.0–36.0)
MCV: 90.5 fL (ref 78.0–100.0)
RBC: 3.99 MIL/uL (ref 3.87–5.11)

## 2012-04-02 LAB — GLUCOSE, CAPILLARY
Glucose-Capillary: 164 mg/dL — ABNORMAL HIGH (ref 70–99)
Glucose-Capillary: 191 mg/dL — ABNORMAL HIGH (ref 70–99)
Glucose-Capillary: 163 mg/dL — ABNORMAL HIGH (ref 70–99)

## 2012-04-02 MED ORDER — SODIUM CHLORIDE 0.9 % IJ SOLN: 3.0000 mL | Freq: Two times a day (BID) | INTRAMUSCULAR | Status: DC

## 2012-04-02 MED ORDER — INSULIN ASPART 100 UNIT/ML ~~LOC~~ SOLN
0.0000 [IU] | Freq: Three times a day (TID) | SUBCUTANEOUS | Status: DC
  Administered 2012-04-02 – 2012-04-03 (×2): 3 [IU] via SUBCUTANEOUS
  Administered 2012-04-04: 2 [IU] via SUBCUTANEOUS
  Administered 2012-04-04: 8 [IU] via SUBCUTANEOUS
  Administered 2012-04-04: 3 [IU] via SUBCUTANEOUS
  Administered 2012-04-04: 2 [IU] via SUBCUTANEOUS
  Administered 2012-04-05: 5 [IU] via SUBCUTANEOUS

## 2012-04-02 MED ORDER — SODIUM CHLORIDE 0.9 % IJ SOLN
3.0000 mL | Freq: Two times a day (BID) | INTRAMUSCULAR | Status: DC
  Administered 2012-04-02: 3 mL via INTRAVENOUS

## 2012-04-02 MED ORDER — HEPARIN (PORCINE) IN NACL 100-0.45 UNIT/ML-% IJ SOLN
700.0000 [IU]/h | INTRAMUSCULAR | Status: DC
  Administered 2012-04-02: 800 [IU]/h via INTRAVENOUS
  Filled 2012-04-02: qty 250

## 2012-04-02 MED ORDER — ASPIRIN EC 81 MG PO TBEC
81.0000 mg | DELAYED_RELEASE_TABLET | Freq: Every day | ORAL | Status: DC
  Filled 2012-04-02: qty 1

## 2012-04-02 MED ORDER — CARVEDILOL 3.125 MG PO TABS: 6.2500 mg | ORAL_TABLET | Freq: Two times a day (BID) | ORAL | Status: DC

## 2012-04-02 MED ORDER — NITROGLYCERIN 0.4 MG SL SUBL: 0.4000 mg | SUBLINGUAL_TABLET | SUBLINGUAL | Status: DC | PRN

## 2012-04-02 MED ORDER — ALLOPURINOL 300 MG PO TABS
300.0000 mg | ORAL_TABLET | Freq: Every day | ORAL | Status: DC
  Administered 2012-04-02 – 2012-04-05 (×4): 300 mg via ORAL
  Filled 2012-04-02 (×4): qty 1

## 2012-04-02 MED ORDER — ACETAMINOPHEN 325 MG PO TABS: 650.0000 mg | ORAL_TABLET | ORAL | Status: DC | PRN

## 2012-04-02 MED ORDER — GLYBURIDE 1.25 MG PO TABS: 5.0000 mg | ORAL_TABLET | Freq: Two times a day (BID) | ORAL | Status: DC

## 2012-04-02 MED ORDER — ASPIRIN 81 MG PO CHEW
324.0000 mg | CHEWABLE_TABLET | ORAL | Status: AC
  Administered 2012-04-03: 324 mg via ORAL
  Filled 2012-04-02: qty 4

## 2012-04-02 MED ORDER — SODIUM CHLORIDE 0.9 % IJ SOLN: 3.0000 mL | INTRAMUSCULAR | Status: DC | PRN

## 2012-04-02 MED ORDER — DIAZEPAM 5 MG PO TABS
5.0000 mg | ORAL_TABLET | ORAL | Status: AC
  Administered 2012-04-03: 5 mg via ORAL
  Filled 2012-04-02: qty 1

## 2012-04-02 MED ORDER — CARVEDILOL 6.25 MG PO TABS
6.2500 mg | ORAL_TABLET | Freq: Two times a day (BID) | ORAL | Status: DC
  Administered 2012-04-02 – 2012-04-05 (×7): 6.25 mg via ORAL
  Filled 2012-04-02 (×8): qty 1

## 2012-04-02 MED ORDER — ASPIRIN 300 MG RE SUPP
300.0000 mg | RECTAL | Status: AC
  Filled 2012-04-02: qty 1

## 2012-04-02 MED ORDER — ONDANSETRON HCL 4 MG/2ML IJ SOLN: 4.0000 mg | Freq: Four times a day (QID) | INTRAMUSCULAR | Status: DC | PRN

## 2012-04-02 MED ORDER — INSULIN ASPART 100 UNIT/ML ~~LOC~~ SOLN
0.0000 [IU] | Freq: Every day | SUBCUTANEOUS | Status: DC
  Administered 2012-04-03: 3 [IU] via SUBCUTANEOUS
  Administered 2012-04-04: 2 [IU] via SUBCUTANEOUS

## 2012-04-02 MED ORDER — GLYBURIDE 5 MG PO TABS
5.0000 mg | ORAL_TABLET | Freq: Two times a day (BID) | ORAL | Status: DC
  Administered 2012-04-02 – 2012-04-05 (×6): 5 mg via ORAL
  Filled 2012-04-02 (×8): qty 1

## 2012-04-02 MED ORDER — SODIUM CHLORIDE 0.9 % IV SOLN
INTRAVENOUS | Status: DC
  Administered 2012-04-03: 04:00:00 via INTRAVENOUS

## 2012-04-02 MED ORDER — SODIUM CHLORIDE 0.9 % IV SOLN: 4.0000 mg | Freq: Four times a day (QID) | INTRAVENOUS | Status: DC | PRN

## 2012-04-02 MED ORDER — PANTOPRAZOLE SODIUM 40 MG PO TBEC
40.0000 mg | DELAYED_RELEASE_TABLET | Freq: Two times a day (BID) | ORAL | Status: DC
  Administered 2012-04-02 – 2012-04-05 (×7): 40 mg via ORAL
  Filled 2012-04-02 (×7): qty 1

## 2012-04-02 MED ORDER — CLOPIDOGREL BISULFATE 75 MG PO TABS: 75.0000 mg | ORAL_TABLET | Freq: Every day | ORAL | Status: DC

## 2012-04-02 MED ORDER — SODIUM CHLORIDE 0.9 % IV SOLN: 250.0000 mL | INTRAVENOUS | Status: DC | PRN

## 2012-04-02 MED ORDER — CLOPIDOGREL BISULFATE 75 MG PO TABS
75.0000 mg | ORAL_TABLET | Freq: Every day | ORAL | Status: DC
  Administered 2012-04-03: 75 mg via ORAL
  Filled 2012-04-02 (×2): qty 1

## 2012-04-02 MED ORDER — PANTOPRAZOLE SODIUM 20 MG PO TBEC: 40.0000 mg | DELAYED_RELEASE_TABLET | Freq: Two times a day (BID) | ORAL | Status: DC

## 2012-04-02 MED ORDER — ATORVASTATIN CALCIUM 80 MG PO TABS
80.0000 mg | ORAL_TABLET | Freq: Every day | ORAL | Status: DC
  Administered 2012-04-02 – 2012-04-05 (×4): 80 mg via ORAL
  Filled 2012-04-02 (×4): qty 1

## 2012-04-02 MED ORDER — ATORVASTATIN CALCIUM 80 MG PO TABS: 80.0000 mg | ORAL_TABLET | Freq: Every day | ORAL | Status: DC

## 2012-04-02 MED ORDER — ALLOPURINOL 100 MG PO TABS: 300.0000 mg | ORAL_TABLET | Freq: Every day | ORAL | Status: DC

## 2012-04-02 MED ORDER — ASPIRIN 81 MG PO CHEW
324.0000 mg | CHEWABLE_TABLET | ORAL | Status: AC
  Administered 2012-04-02: 324 mg via ORAL
  Filled 2012-04-02: qty 4

## 2012-04-02 MED ORDER — ASPIRIN EC 81 MG PO TBEC: 81.0000 mg | DELAYED_RELEASE_TABLET | Freq: Every day | ORAL | Status: DC

## 2012-04-02 MED FILL — Heparin Sodium (Porcine) 100 Unt/ML in Sodium Chloride 0.45%: INTRAMUSCULAR | Qty: 250 | Status: AC

## 2012-04-02 NOTE — Progress Notes (Signed)
ANTICOAGULATION CONSULT NOTE - Follow Up Consult  Pharmacy Consult for heparin Indication: chest pain/ACS  Allergies  Allergen Reactions  . Ace Inhibitors     Hyperkalemia  . Cephalexin     Reaction unknown-but on pt's list of medications that she was told she was very allergic to.  . Furosemide Other (See Comments)    presyncope  . Levaquin Swelling  . Bactrim Rash  . Ciprocin-Fluocin-Procin (Fluocinolone Acetonide) Rash  . Ciprofloxacin Rash  . Codeine Rash  . Morphine And Related Rash  . Pentazocine Rash  . Promethazine Swelling and Rash  . Sulfa Antibiotics Swelling and Rash  . Trimethoprim Swelling and Rash   Patient Measurements: Height: 5' 1.8" (157 cm) Weight: 176 lb (79.833 kg) IBW/kg (Calculated) : 49.64  Heparin Dosing Weight: 67.5kg  Vital Signs: Temp: 97.4 F (36.3 C) (04/08 2117) Temp src: Oral (04/08 2117) BP: 151/74 mmHg (04/08 2117) Pulse Rate: 86  (04/08 2117)  Labs:  Basename 04/02/12 2137 04/02/12 1200  HGB -- 11.9*  HCT -- 36.1  PLT -- 273  APTT -- --  LABPROT -- --  INR -- --  HEPARINUNFRC 0.79* --  CREATININE -- 1.03  CKTOTAL -- --  CKMB -- --  TROPONINI -- --   Estimated Creatinine Clearance: 43.8 ml/min (by C-G formula based on Cr of 1.03).   Medications:  Infusions:    . sodium chloride    . heparin 800 Units/hr (04/02/12 1349)    Assessment: 80 yof transfered from [facility] for chest pain for planned cardiac cath procedure tomorrow. Pt was changed from lovenox to IV heparin this AM prior to transfer. Heparin level is supratherapeutic at 0.79. No bleeding or other problems noted.   Goal of Therapy:  Heparin level 0.3-0.7 units/ml   Plan:  1. Decrease heparin gtt to 700 units/hr 2. Check AM HL 3. F/u cath plans  [name], [name] 04/02/2012,10:23 PM

## 2012-04-02 NOTE — Telephone Encounter (Signed)
Refilled atenolol

## 2012-04-02 NOTE — Progress Notes (Signed)
NAMEHENRITTA, MUTZ NMN EDWARDS ROOM: 206  UNIT NUMBER:  [id number] LOCATION: 43F 206 01 ADM/VISIT DATE:  04/01/2012   ADM PHYSKathaleen Grinder:  [id number] DOB: 10-Apr-1931   CONSULTING PHYSICIAN:  Dr. Sherril Croon  REASON FOR CONSULTATION:  Chest pain.  HISTORY OF PRESENT ILLNESS:  The patient is a 78 year old female who is currently being seen in consultation with Dr. Sherril Croon for evaluation of chest pain.  The patient has history of coronary artery disease status post coronary artery bypass graft x4 in 1997.  Per recent cardiology consultation, she reportedly had a cardiac catheterization 11/2010 that showed patent grafts (the grafts are currently unknown to me).  She has been doing fairly well up until last night when she had 1 episode of chest discomfort at rest.  Pain is described as a sharp pain that was 10 out of 10 in severity associated with left arm numbness.  She denies any nausea or vomiting, shortness of breath or diaphoresis.  She also denies any PND, orthopnea, palpitations, or syncope.  She took several Tums without any relief and subsequently presented to the emergency room where she became chest pain free after 2 sublingual nitroglycerin.  Currently she is chest pain free.  Her cardiac markers showed CK of 90, MB of 7.8, and troponin I of 1.4.  She is currently clinically hemodynamically stable.  PAST MEDICAL HISTORY: 1. Hypertension. 2. Diabetes mellitus. 3. Seizure disorder. 4. CVA x2. 5. Gout. 6. Congestive heart failure with left ventricular ejection fraction of 40% to 45% by echo from 10/082/2012. 7. Coronary artery disease status post myocardial infarction in 1997.  Status post coronary artery bypass graft surgery x4 in 1997 (anatomy of graft is currently unknown).  PAST SURGICAL HISTORY: 1. Coronary artery bypass graft surgery. 2. Right great toe amputation. 3. Left hip surgery. 4. Bilateral knee surgery.  FAMILY HISTORY:  She has no family history of early coronary artery  disease.  SOCIAL HISTORY:  The patient denies any tobacco abuse or ETOH abuse.  CURRENT MEDICATIONS: 1. Allopurinol 300 mg p.o. daily. 2. Metformin 500 mg p.o. daily. 3. Glyburide 5 mg p.o. b.i.d. 4. Atenolol 50 mg p.o. b.i.d. 5. Fish oil 1000 mg p.o. daily. 6. Aggrenox 25 mg/200 mg 1 p.o. b.i.d. 7. Lovenox 1 mg/kg subcu.  REVIEW OF SYSTEMS:  A review of 12 systems was performed and is currently unremarkable except as described in the history of present illness above.  PHYSICAL EXAMINATION:  Vital signs:  Temperature 97.5, blood pressure 139/63, pulse rate of 78, the patient is breathing 18 times a minute.  Appearance:  She appears to be about her stated age, does not appear to be in any distress.  Neck:  She has no JVD.  She has normal carotid upstroke.  No carotid bruits.  Pulmonary:  Lungs are clear to auscultation and percussion bilaterally with normal effort.  Cardiovascular:  PMI is nondisplaced.  She has no right ventricular heave.  She has normal S1, S2.  No S3.  No S4.  No murmurs.  Abdomen:  Soft, nontender, nondistended, positive bowel sounds.  No hepatosplenomegaly.  Extremities:  She has no pedal bilaterally.  I reviewed her medical records.  Her electrocardiogram showed sinus rhythm with PVCs and right bundle branch block.  LABORATORY DATA:  Showed WBC 7700, hemoglobin 12.0, hematocrit 38.3, platelets of 256,000.  Sodium 137, potassium 4.8, chloride of 98, CO2 of 28.  BUN 34, creatinine 1.2, glucose of 337.  Echocardiogram from 10/03/2011 showed left ventricular hypertrophy with  left ventricular ejection fraction of 40% to 45%.  Per a recent cardiology consultation note, the patient underwent cardiac catheterization 11/2010, which showed patent grafts although the anatomy of the grafts is not mentioned.  ASSESSMENT AND PLAN: 1. Non-ST-elevation myocardial infarction with mildly elevated troponin.  The patient is currently on a total of 50 mg of aspirin daily as part of her  Aggrenox that she takes b.i.d. since each tablet of Aggrenox had 25 mg of aspirin.  The patient is resistant to taking higher dose of aspirin.  I will recommend adding Plavix 75 mg p.o. daily to her current medications.  At this time, the patient would like to think about this before committing to taking Plavix.  I also feel that she should be on Lipitor 10 mg p.o. at bedtime, but again, the patient would like to think about this at this point.  If her enzymes are rising or if she continues to have chest discomfort, then we could consider doing a cardiac catheterization; otherwise, we will treat her medically as outlined above. 2. Left ventricular systolic dysfunction.  The patient is currently on atenolol 50 mg p.o. b.i.d.  If the patient does not have a contraindication to angiotension-converting enzyme inhibitor, she would benefit from addition of an ACE inhibitor like Lisinopril 5 or 10 mg p.o. daily to her current medication regimen. 3. Hyperlipidemia.  The patient is currently on fish oil 1000 mg p.o. at bedtime.  We will recommend changing her to Lipitor 10 mg p.o. at bedtime as recommended above. 4. Noninsulin-dependent diabetes mellitus.  The patient is currently on oral hypoglycemic agent.  Again, if there are no significant contraindications to ACE inhibitor, the patient could benefit from addition of ACE inhibitor to her current regimen. 5. The patient will need a followup with cardiologist upon discharge from the hospital.   __________________________    Gemma Payor, M.D. Alinda Money D: 04/01/2012 1259 T: 04/01/2012 1337 P: AIT1   Addendum 4/8:  Patient seen and examined with Mr. Shara Blazing. History reviewed. She has had no further chest pain, however troponin levels increased to 1.4 consistent with NSTEMI. ECG nonspecific. She has a history of multivessel CAD status post CABG in 1997, patent bypass graft in 2011. Interval history of CHF decompensation within the last 6 months, and LVEF of  approximately 40%. In light of reduction in LVEF and new NSTEMI, plan is to transfer her to [facility] for diagnostic cardiac catheterization to assess revascularization options. This was discussed with the patient, she is in agreement to proceed. We are planning to change beta blocker therapy to Coreg, switched from Lovenox to heparin, and would eventually add ACE inhibitor after angiography, presuming renal function is stable.  Jonelle Sidle, M.D., F.A.C.C.

## 2012-04-02 NOTE — Progress Notes (Signed)
ANTICOAGULATION CONSULT NOTE - Initial Consult  Pharmacy Consult for heparin Indication: chest pain/ACS  Allergies  Allergen Reactions  . Ace Inhibitors     Hyperkalemia  . Cephalexin     Reaction unknown-but on pt's list of medications that she was told she was very allergic to.  . Furosemide Other (See Comments)    presyncope  . Levaquin Swelling  . Bactrim Rash  . Ciprocin-Fluocin-Procin (Fluocinolone Acetonide) Rash  . Ciprofloxacin Rash  . Codeine Rash  . Morphine And Related Rash  . Pentazocine Rash  . Promethazine Swelling and Rash  . Sulfa Antibiotics Swelling and Rash  . Trimethoprim Swelling and Rash    Patient Measurements: Height: 5' 1.81" (157 cm) Weight: 176 lb 2.4 oz (79.9 kg) IBW/kg (Calculated) : 49.67  Heparin Dosing Weight: 67.5  Labs: No results found for this basename: HGB:2,HCT:3,PLT:3,APTT:3,LABPROT:3,INR:3,HEPARINUNFRC:3,CREATININE:3,CKTOTAL:3,CKMB:3,TROPONINI:3 in the last 72 hours Estimated Creatinine Clearance: 31.2 ml/min (by C-G formula based on Cr of 1.45).  Medical History: Past Medical History  Diagnosis Date  . Hypertension   . Gout   . GERD (gastroesophageal reflux disease)   . Hyperkalemia   . CHF (congestive heart failure)     EF 48%  . Hyperlipidemia   . Coronary artery disease   . Diabetes mellitus     oral meds - no insulin  . Myocardial infarction     1997  . Seizures     2001 and poss seizure aug 2012--pt fell-suffered broken hip  . Arthritis     s/p left  hip orif --has a lot of pain in hip  . Osteoporosis   . Retinal hemorrhage, both eyes     states laser of both eyes in the past  . Stroke     1980 and then 2 strokes 2012  residual effects--rt sided facial weakness and rt hand weakness and  rt foot "turns over"    DR. LOVE IS PT'S NEUROLOGIST    Medications:  Prescriptions prior to admission  Medication Sig Dispense Refill  . acetaminophen (TYLENOL) 325 MG tablet Take 2 tablets (650 mg total) by mouth every 6  (six) hours as needed for pain (or Fever >/= 101).      . AGGRENOX 25-200 MG per 12 hr capsule Take 1 capsule by mouth 2 (two) times daily at 10 AM and 5 PM.       . allopurinol (ZYLOPRIM) 300 MG tablet Take 300 mg by mouth daily.       Marland Kitchen atenolol (TENORMIN) 50 MG tablet Take 50 mg by mouth 2 (two) times daily.      . Biotin 1000 MCG tablet Take 1,000 mcg by mouth daily.       . Calcium-Magnesium-Vitamin D (CITRACAL CALCIUM+D) 600-40-500 MG-MG-UNIT TB24 Take 2 tablets by mouth 2 (two) times daily.       . cholecalciferol (VITAMIN D) 1000 UNITS tablet Take 1,000 Units by mouth daily.      Marland Kitchen desoximetasone (TOPICORT) 0.25 % cream Apply 1 application topically 2 (two) times daily as needed. Itching       . ferrous sulfate 325 (65 FE) MG tablet Take 1 tablet (325 mg total) by mouth 3 (three) times daily after meals.      . fish oil-omega-3 fatty acids 1000 MG capsule Take 2 g by mouth daily.       Marland Kitchen glyBURIDE (DIABETA) 5 MG tablet Take 5 mg by mouth 2 (two) times daily with a meal.       . lamoTRIgine (LAMICTAL)  100 MG tablet Take 100 mg by mouth 2 (two) times daily.       . metFORMIN (GLUCOPHAGE) 500 MG tablet Take 1,000 mg by mouth 2 (two) times daily with a meal.      . omeprazole (PRILOSEC) 20 MG capsule Take 20 mg by mouth 2 (two) times daily.       . RED YEAST RICE EXTRACT PO Take 600 mg by mouth daily.       Marland Kitchen tetrahydrozoline 0.05 % ophthalmic solution Place 1 drop into both eyes 2 (two) times daily. Same as Visine eye drops (over the counter)      . torsemide (DEMADEX) 20 MG tablet Take 10-20 mg by mouth Daily. Pt takes a 0.5 tablet every day but if swelling will take a whole tablet.        Assessment: 78 yo female transferred from [facility] hospital for chest pain and noted diagnostic cath procedure tomorrow. Patient was on lovenox yesterday and was changed to IV heparin this morning, on transfer the heparin was running at 880 unit/hour. No bleeding issues noted on arrival. Patient was also  loaded with 300mg  of plavix(on aggrenox pta).  Goal of Therapy:  Heparin level 0.3-0.7 units/ml   Plan:  Restart heparin here at 800 units/hr here at [facility] Check HL tonight Daily CBC/HL  Severiano Gilbert 04/02/2012,12:15 PM

## 2012-04-03 ENCOUNTER — Ambulatory Visit (HOSPITAL_COMMUNITY): Admit: 2012-04-03 | Payer: Medicare Other | Admitting: Cardiology

## 2012-04-03 ENCOUNTER — Encounter (HOSPITAL_COMMUNITY): Payer: Self-pay | Admitting: Cardiology

## 2012-04-03 ENCOUNTER — Encounter (HOSPITAL_COMMUNITY)
Admission: AD | Disposition: A | Discharge status: Home / Self Care | Payer: Self-pay | Source: Other Acute Inpatient Hospital | Attending: Cardiology

## 2012-04-03 DIAGNOSIS — I2510 Atherosclerotic heart disease of native coronary artery without angina pectoris: Secondary | ICD-10-CM

## 2012-04-03 HISTORY — PX: LEFT HEART CATHETERIZATION WITH CORONARY/GRAFT ANGIOGRAM: SHX5450

## 2012-04-03 LAB — PROTIME-INR
INR: 0.91 (ref 0.00–1.49)
Prothrombin Time: 12.4 seconds (ref 11.6–15.2)

## 2012-04-03 LAB — BASIC METABOLIC PANEL
BUN: 26 mg/dL — ABNORMAL HIGH (ref 6–23)
Chloride: 99 mEq/L (ref 96–112)
GFR calc Af Amer: 62 mL/min — ABNORMAL LOW (ref >90)
GFR calc non Af Amer: 54 mL/min — ABNORMAL LOW (ref >90)
Potassium: 4.2 mEq/L (ref 3.5–5.1)
Sodium: 136 mEq/L (ref 135–145)

## 2012-04-03 LAB — GLUCOSE, CAPILLARY
Glucose-Capillary: 198 mg/dL — ABNORMAL HIGH (ref 70–99)
Glucose-Capillary: 128 mg/dL — ABNORMAL HIGH (ref 70–99)
Glucose-Capillary: 115 mg/dL — ABNORMAL HIGH (ref 70–99)
Glucose-Capillary: 111 mg/dL — ABNORMAL HIGH (ref 70–99)
Glucose-Capillary: 290 mg/dL — ABNORMAL HIGH (ref 70–99)

## 2012-04-03 LAB — HEPARIN LEVEL (UNFRACTIONATED): Heparin Unfractionated: 0.4 IU/mL (ref 0.30–0.70)

## 2012-04-03 LAB — HEPATIC FUNCTION PANEL: Total Protein: 6.8 g/dL (ref 6.0–8.3)

## 2012-04-03 SURGERY — LEFT HEART CATHETERIZATION WITH CORONARY/GRAFT ANGIOGRAM
Anesthesia: LOCAL

## 2012-04-03 MED ORDER — ACETAMINOPHEN 325 MG PO TABS: 650.0000 mg | ORAL_TABLET | ORAL | Status: DC | PRN

## 2012-04-03 MED ORDER — ASPIRIN-DIPYRIDAMOLE ER 25-200 MG PO CP12
1.0000 | ORAL_CAPSULE | Freq: Two times a day (BID) | ORAL | Status: DC
  Administered 2012-04-04: 1 via ORAL
  Filled 2012-04-03 (×4): qty 1

## 2012-04-03 MED ORDER — FERROUS SULFATE 325 (65 FE) MG PO TABS
325.0000 mg | ORAL_TABLET | Freq: Three times a day (TID) | ORAL | Status: DC
  Administered 2012-04-03 – 2012-04-05 (×7): 325 mg via ORAL
  Filled 2012-04-03 (×8): qty 1

## 2012-04-03 MED ORDER — HEPARIN (PORCINE) IN NACL 2-0.9 UNIT/ML-% IJ SOLN
INTRAMUSCULAR | Status: AC
  Filled 2012-04-03: qty 2000

## 2012-04-03 MED ORDER — LAMOTRIGINE 100 MG PO TABS
100.0000 mg | ORAL_TABLET | Freq: Two times a day (BID) | ORAL | Status: DC
  Administered 2012-04-03 – 2012-04-05 (×5): 100 mg via ORAL
  Filled 2012-04-03 (×6): qty 1

## 2012-04-03 MED ORDER — SODIUM CHLORIDE 0.9 % IV SOLN: INTRAVENOUS | Status: AC

## 2012-04-03 MED ORDER — NITROGLYCERIN 0.2 MG/ML ON CALL CATH LAB
INTRAVENOUS | Status: AC
  Filled 2012-04-03: qty 1

## 2012-04-03 MED ORDER — FERROUS FUMARATE 325 (106 FE) MG PO TABS
1.0000 | ORAL_TABLET | Freq: Three times a day (TID) | ORAL | Status: DC
  Filled 2012-04-03 (×3): qty 1

## 2012-04-03 MED ORDER — ONDANSETRON HCL 4 MG/2ML IJ SOLN: 4.0000 mg | Freq: Four times a day (QID) | INTRAMUSCULAR | Status: DC | PRN

## 2012-04-03 MED ORDER — LIDOCAINE HCL (PF) 1 % IJ SOLN
INTRAMUSCULAR | Status: AC
  Filled 2012-04-03: qty 30

## 2012-04-03 NOTE — Interval H&P Note (Signed)
History and Physical Interval Note:  04/03/2012 8:56 AM  [name]  has presented today for surgery, with the diagnosis of chest pain  The various methods of treatment have been discussed with the patient. After consideration of risks, benefits and other options for treatment, the patient has consented to  Procedure(s) (LRB): LEFT HEART CATHETERIZATION WITH CORONARY/GRAFT ANGIOGRAM (N/A) as a surgical intervention .  The patients' history has been reviewed, patient examined, no change in status, stable for surgery.  I have reviewed the patients' chart and labs.  Questions were answered to the patient's satisfaction.     [name]

## 2012-04-03 NOTE — H&P (View-Only) (Signed)
NAME:  [name], [name] ROOM: 206  UNIT NUMBER:  [id number] LOCATION: 2F 206 01 ADM/VISIT DATE:  04/01/2012   ADM PHYS:  ACCT:  [id number] DOB: 01/20/1931   CONSULTING PHYSICIAN:  Dr. Vyas  REASON FOR CONSULTATION:  Chest pain.  HISTORY OF PRESENT ILLNESS:  The patient is a 78-year-old female who is currently being seen in consultation with Dr. Vyas for evaluation of chest pain.  The patient has history of coronary artery disease status post coronary artery bypass graft x4 in 1997.  Per recent cardiology consultation, she reportedly had a cardiac catheterization 11/2010 that showed patent grafts (the grafts are currently unknown to me).  She has been doing fairly well up until last night when she had 1 episode of chest discomfort at rest.  Pain is described as a sharp pain that was 10 out of 10 in severity associated with left arm numbness.  She denies any nausea or vomiting, shortness of breath or diaphoresis.  She also denies any PND, orthopnea, palpitations, or syncope.  She took several Tums without any relief and subsequently presented to the emergency room where she became chest pain free after 2 sublingual nitroglycerin.  Currently she is chest pain free.  Her cardiac markers showed CK of 90, MB of 7.8, and troponin I of 1.4.  She is currently clinically hemodynamically stable.  PAST MEDICAL HISTORY: 1. Hypertension. 2. Diabetes mellitus. 3. Seizure disorder. 4. CVA x2. 5. Gout. 6. Congestive heart failure with left ventricular ejection fraction of 40% to 45% by echo from 10/082/2012. 7. Coronary artery disease status post myocardial infarction in 1997.  Status post coronary artery bypass graft surgery x4 in 1997 (anatomy of graft is currently unknown).  PAST SURGICAL HISTORY: 1. Coronary artery bypass graft surgery. 2. Right great toe amputation. 3. Left hip surgery. 4. Bilateral knee surgery.  FAMILY HISTORY:  She has no family history of early coronary artery  disease.  SOCIAL HISTORY:  The patient denies any tobacco abuse or ETOH abuse.  CURRENT MEDICATIONS: 1. Allopurinol 300 mg p.o. daily. 2. Metformin 500 mg p.o. daily. 3. Glyburide 5 mg p.o. b.i.d. 4. Atenolol 50 mg p.o. b.i.d. 5. Fish oil 1000 mg p.o. daily. 6. Aggrenox 25 mg/200 mg 1 p.o. b.i.d. 7. Lovenox 1 mg/kg subcu.  REVIEW OF SYSTEMS:  A review of 12 systems was performed and is currently unremarkable except as described in the history of present illness above.  PHYSICAL EXAMINATION:  Vital signs:  Temperature 97.5, blood pressure 139/63, pulse rate of 78, the patient is breathing 18 times a minute.  Appearance:  She appears to be about her stated age, does not appear to be in any distress.  Neck:  She has no JVD.  She has normal carotid upstroke.  No carotid bruits.  Pulmonary:  Lungs are clear to auscultation and percussion bilaterally with normal effort.  Cardiovascular:  PMI is nondisplaced.  She has no right ventricular heave.  She has normal S1, S2.  No S3.  No S4.  No murmurs.  Abdomen:  Soft, nontender, nondistended, positive bowel sounds.  No hepatosplenomegaly.  Extremities:  She has no pedal bilaterally.  I reviewed her medical records.  Her electrocardiogram showed sinus rhythm with PVCs and right bundle branch block.  LABORATORY DATA:  Showed WBC 7700, hemoglobin 12.0, hematocrit 38.3, platelets of 256,000.  Sodium 137, potassium 4.8, chloride of 98, CO2 of 28.  BUN 34, creatinine 1.2, glucose of 337.  Echocardiogram from 10/03/2011 showed left ventricular hypertrophy with   left ventricular ejection fraction of 40% to 45%.  Per a recent cardiology consultation note, the patient underwent cardiac catheterization 11/2010, which showed patent grafts although the anatomy of the grafts is not mentioned.  ASSESSMENT AND PLAN: 1. Non-ST-elevation myocardial infarction with mildly elevated troponin.  The patient is currently on a total of 50 mg of aspirin daily as part of her  Aggrenox that she takes b.i.d. since each tablet of Aggrenox had 25 mg of aspirin.  The patient is resistant to taking higher dose of aspirin.  I will recommend adding Plavix 75 mg p.o. daily to her current medications.  At this time, the patient would like to think about this before committing to taking Plavix.  I also feel that she should be on Lipitor 10 mg p.o. at bedtime, but again, the patient would like to think about this at this point.  If her enzymes are rising or if she continues to have chest discomfort, then we could consider doing a cardiac catheterization; otherwise, we will treat her medically as outlined above. 2. Left ventricular systolic dysfunction.  The patient is currently on atenolol 50 mg p.o. b.i.d.  If the patient does not have a contraindication to angiotension-converting enzyme inhibitor, she would benefit from addition of an ACE inhibitor like Lisinopril 5 or 10 mg p.o. daily to her current medication regimen. 3. Hyperlipidemia.  The patient is currently on fish oil 1000 mg p.o. at bedtime.  We will recommend changing her to Lipitor 10 mg p.o. at bedtime as recommended above. 4. Noninsulin-dependent diabetes mellitus.  The patient is currently on oral hypoglycemic agent.  Again, if there are no significant contraindications to ACE inhibitor, the patient could benefit from addition of ACE inhibitor to her current regimen. 5. The patient will need a followup with cardiologist upon discharge from the hospital.   __________________________    JULIUS AITSEBAOMO, M.D. /landm D: 04/01/2012 1259 T: 04/01/2012 1337 P: AIT1   Addendum 4/8:  Patient seen and examined with Mr. Serpe. History reviewed. She has had no further chest pain, however troponin levels increased to 1.4 consistent with NSTEMI. ECG nonspecific. She has a history of multivessel CAD status post CABG in 1997, patent bypass graft in 2011. Interval history of CHF decompensation within the last 6 months, and LVEF of  approximately 40%. In light of reduction in LVEF and new NSTEMI, plan is to transfer her to [place] [facility] for diagnostic cardiac catheterization to assess revascularization options. This was discussed with the patient, she is in agreement to proceed. We are planning to change beta blocker therapy to Coreg, switched from Lovenox to heparin, and would eventually add ACE inhibitor after angiography, presuming renal function is stable.  Ronald Londo G. Geniece Akers, M.D., F.A.C.C.   

## 2012-04-03 NOTE — Progress Notes (Signed)
MEDICATION RELATED CONSULT NOTE - INITIAL   Home medication Clarification  I have re-interviewed the patient(family present) on her allergies and home medications prior to admit. She is a relatively poor historian with difficulty hearing at times but was agreeable to taking the medications listed below and found in the modified medication history. In particular she does  Agree to taking lamictal 100mg  bid for previous seizure d/o.  The duplicates and inpatient medications from [facility] have now been removed.   Her allergy history is somewhat complex given that she has trouble remembering names of previous medications but most of these were confirmed with her with some added based on the list that she provided on admission.  **Of note she reported that at one time it was thought that she was allergic to plavix(hallucinations) but this was later found to be caused by a different medication that has since been stopped.    Please reconsult pharmacy if there is any further discrepancies found. I d/w Ronie Spies that the changes were made in the chart.  Thank you,  Sheppard Coil PharmD.  Allergies  Allergen Reactions  . Ace Inhibitors     Hyperkalemia  . Cephalexin     Reaction unknown-but on pt's list of medications that she was told she was very allergic to.  . Furosemide Other (See Comments)    presyncope  . Levaquin Swelling  . Bactrim Rash  . Ciprocin-Fluocin-Procin (Fluocinolone Acetonide) Rash  . Ciprofloxacin Rash  . Codeine Rash  . Morphine And Related Rash  . Pentazocine Rash  . Promethazine Swelling and Rash  . Sulfa Antibiotics Swelling and Rash  . Trimethoprim Swelling and Rash    Medications:  Prior to Admission medications   Medication Sig Start Date End Date Taking? Authorizing Provider  AGGRENOX 25-200 MG per 12 hr capsule Take 1 capsule by mouth 2 (two) times daily at 10 AM and 5 PM.  03/16/11  Yes Historical Provider, MD  allopurinol (ZYLOPRIM) 300 MG tablet Take 300 mg  by mouth daily.    Yes Historical Provider, MD  atenolol (TENORMIN) 50 MG tablet Take 50 mg by mouth 2 (two) times daily. 06/27/11  Yes [name], MD  Biotin 1000 MCG tablet Take 1,000 mcg by mouth daily.    Yes Historical Provider, MD  Calcium-Magnesium-Vitamin D (CITRACAL CALCIUM+D) 600-40-500 MG-MG-UNIT TB24 Take 2 tablets by mouth 2 (two) times daily.    Yes Historical Provider, MD  cholecalciferol (VITAMIN D) 1000 UNITS tablet Take 1,000 Units by mouth daily.   Yes Historical Provider, MD  desoximetasone (TOPICORT) 0.25 % cream Apply 1 application topically 2 (two) times daily as needed. Itching  01/12/11  Yes Historical Provider, MD  diphenhydramine-acetaminophen (TYLENOL PM) 25-500 MG TABS Take 1 tablet by mouth at bedtime as needed. For sleep   Yes Historical Provider, MD  ferrous sulfate 325 (65 FE) MG tablet Take 325 mg by mouth 3 (three) times daily with meals.   Yes Historical Provider, MD  fish oil-omega-3 fatty acids 1000 MG capsule Take 2 g by mouth daily.    Yes Historical Provider, MD  glyBURIDE (DIABETA) 5 MG tablet Take 5 mg by mouth 2 (two) times daily with a meal.    Yes Historical Provider, MD  lamoTRIgine (LAMICTAL) 100 MG tablet Take 100 mg by mouth 2 (two) times daily.  03/25/11  Yes Historical Provider, MD  metFORMIN (GLUCOPHAGE) 500 MG tablet Take 500 mg by mouth 2 (two) times daily with a meal.   Yes Historical  Provider, MD  methocarbamol (ROBAXIN) 500 MG tablet Take 750 mg by mouth 3 (three) times daily as needed. For muscle spasms   Yes Historical Provider, MD  omeprazole (PRILOSEC) 20 MG capsule Take 20 mg by mouth 2 (two) times daily.    Yes Historical Provider, MD  RED YEAST RICE EXTRACT PO Take 600 mg by mouth daily.    Yes Historical Provider, MD  tetrahydrozoline 0.05 % ophthalmic solution Place 1 drop into both eyes 2 (two) times daily. Same as Visine eye drops (over the counter)   Yes Historical Provider, MD  torsemide (DEMADEX) 20 MG tablet Take 20 mg by  mouth Daily.  10/04/11  Yes Historical Provider, MD    Severiano Gilbert 04/03/2012,1:00 PM

## 2012-04-03 NOTE — CV Procedure (Signed)
   Cardiac Catheterization Procedure Note  Name: [name] MRN: [id number] DOB: 01/19/1931  Procedure: Left Heart Cath, Selective Coronary Angiography, SVG and LIMA angiography,  LV angiography  Indication: Non STEMI   Procedural details: The right groin was prepped, draped, and anesthetized with 1% lidocaine. Using modified Seldinger technique, a 5 French sheath was introduced into the right femoral artery. Standard Judkins catheters were used for coronary angiography and left ventriculography.  LIMA was done with LIMA catheter and glide.  SVG were injected with standard catheters.   Catheter exchanges were performed over a guidewire. There were no immediate procedural complications. The patient was transferred to the post catheterization recovery area for further monitoring.  I spoke with the family at the end of the case.    Procedural Findings: Hemodynamics:  AO 164/74 (112) LV 169/17   Coronary angiography: Coronary dominance: right  Left mainstem: No critical disease  Left anterior descending (LAD): Severely disease at the ostium then totally occluded  Left circumflex (LCx): The circumflex provides a ramus with 75-80% proximal disease.  The distal vessel is small to moderate.  The OM2 is small and severely diseased.  The OM3 has about 50-70% segmental proximal disease.  The OM4 is segmentally plaqued leading into OM4.  It is probably 50-70%.    Right coronary artery (RCA): Large caliber vesselwith 20% proximal.  There is a 60% distal stenosis.  The PDA has competitive flow.  The PLA 1 is intact.  There is 70% segmental disease leading into PLA 2.    The SVG to the PDA is widely patent.  The PDA distally has 40% narrowing.  There is some collateralization of the OM system.    The SVG to the OM system is occluded proximally, new from the prior study.  The LIMA to the LAD is widely patent.  The LAD is smaller in caliber and thread like distally.  The LIMA back fills the LAD  into the diagonal, and the diagonal looks like it has high grade ostial disease.      Left ventriculography: Left ventricular systolic function is reduced.  There is distal inferior and apical hypokinesis. EF estimate 45%.  The aortic valve opens well.    Final Conclusions:   1.  Patent IMA to the LAD with severe distal disease 2.  Patent SVG to PDA with other RCA disease 3.  New occlusion of SVG to OM system 4.  Moderately reduced LV function as noted.    Recommendations:  1.  With prior CVA, and current anatomy, would lean toward medical therapy.  Nothing is ideal for PCI and she is not a redo candidate per se.  We are working to get her medications resolved.    Shawnie Pons 04/03/2012, 10:37 AM

## 2012-04-03 NOTE — Progress Notes (Signed)
Patient: [name] Date of Encounter: 04/03/2012, 8:22 AM Admit date: 04/02/2012     Subjective  No CP or SOB. Currently no complaints   Objective   Telemetry: sinus tach overnight, NSR 90s now Physical Exam: Filed Vitals:   04/03/12 0539  BP: 159/76  Pulse: 88  Temp: 98.1 F (36.7 C)  Resp: 20   General: Well developed, well nourished, in no acute distress. Head: Normocephalic, atraumatic, sclera non-icteric, no xanthomas, nares are without discharge.  Neck: JVD not elevated. Lungs: Clear anteriorly bilaterally to auscultation without wheezes, rales, or rhonchi. Breathing is unlabored. Heart: RRR S1 S2 without murmurs, rubs, or gallops.  Abdomen: Soft, non-tender, non-distended with normoactive bowel sounds. No hepatomegaly. No rebound/guarding. No obvious abdominal masses. Msk:  Strength and tone appear normal for age. Extremities: No clubbing or cyanosis. Legs are slightly warm but no edema.  Distal pedal pulses are diminished bilaterally. Neuro: Alert and oriented X 3. Moves all extremities spontaneously. Psych:  Responds to questions appropriately with a normal affect.    Intake/Output Summary (Last 24 hours) at 04/03/12 1610 Last data filed at 04/03/12 0600  Gross per 24 hour  Intake 813.75 ml  Output    701 ml  Net 112.75 ml    Inpatient Medications:    . allopurinol  300 mg Oral Daily  . aspirin  324 mg Oral NOW   Or  . aspirin  300 mg Rectal NOW  . aspirin  324 mg Oral Pre-Cath  . aspirin EC  81 mg Oral Daily  . atorvastatin  80 mg Oral q1800  . carvedilol  6.25 mg Oral BID WC  . clopidogrel  75 mg Oral Q breakfast  . diazepam  5 mg Oral On Call  . glyBURIDE  5 mg Oral BID WC  . insulin aspart  0-15 Units Subcutaneous TID WC  . insulin aspart  0-5 Units Subcutaneous QHS  . pantoprazole  40 mg Oral BID AC  . sodium chloride  3 mL Intravenous Q12H  . sodium chloride  3 mL Intravenous Q12H    Labs:  Basename 04/03/12 0620 04/02/12 1200  NA 136  136  K 4.2 4.4  CL 99 96  CO2 23 29  GLUCOSE 184* 191*  BUN 26* 24*  CREATININE 0.98 1.03  CALCIUM 9.3 9.6  MG -- --  PHOS -- --    Basename 04/02/12 1200  WBC 8.5  NEUTROABS --  HGB 11.9*  HCT 36.1  MCV 90.5  PLT 273    Basename 04/03/12 0620  CHOL 227*  HDL 48  LDLCALC 125*  TRIG 268*  CHOLHDL 4.7    Radiology/Studies: No results found for [place]   Assessment and Plan  78 y/o F with hx CAD s/p CABG 1997, CHF EF 40-45% 09/2011, CVA, DM presented from [facility] with complaints of CP found to have an NSTEMI.  1. NSTEMI with known CAD s/p CABG - for cath today. Anatomy of grafts unknown per H&P. Continue ASA, BB, statin, Plavix, heparin. If cath is unrevealing, consider r/o for PE given tachycardia overnight (although not currently SOB or tachypnic).   2. LV dysfunction with EF 40-45% - euvolemic by brief exam this AM. h/o intolerance to ACEI due to hyperkalemia. May consider initiation of low-dose ARB post-cath if renal function is stable.  3. Elevated BUN - mild renal insufficiency with GFR 54. Receiving gentle hydration post cath. Follow BUN/Cr after procedure. Given fairly recent 2D echo within last 6 months, may  want to defer LV gram.  4. Hyperlipidemia - on Red Yeast Rice at home. Statin added as inpatient. Will add LFTs to AM labs for baseline, f/u LFTs/lipids 6 weeks.   5. H/o CVA - on aggrenox PTA. Plavix added to regimen this hospitalization.  Will place order to clarify home med rec per pharmacy - contains multiple duplicates.  Signed, Ronie Spies PA-C

## 2012-04-03 NOTE — Progress Notes (Signed)
UR Completed. Proctor, Tiffany Hoots F [phone]  

## 2012-04-04 DIAGNOSIS — I214 Non-ST elevation (NSTEMI) myocardial infarction: Secondary | ICD-10-CM

## 2012-04-04 MED ORDER — ASPIRIN 81 MG PO CHEW
81.0000 mg | CHEWABLE_TABLET | Freq: Every day | ORAL | Status: DC
  Administered 2012-04-05: 81 mg via ORAL
  Filled 2012-04-04: qty 1

## 2012-04-04 MED ORDER — RANOLAZINE ER 500 MG PO TB12
500.0000 mg | ORAL_TABLET | Freq: Two times a day (BID) | ORAL | Status: DC
  Administered 2012-04-04 – 2012-04-05 (×2): 500 mg via ORAL
  Filled 2012-04-04 (×4): qty 1

## 2012-04-04 MED ORDER — CLOPIDOGREL BISULFATE 75 MG PO TABS
75.0000 mg | ORAL_TABLET | Freq: Every day | ORAL | Status: DC
  Administered 2012-04-05: 75 mg via ORAL
  Filled 2012-04-04 (×2): qty 1

## 2012-04-04 NOTE — Progress Notes (Signed)
Patient for cath study today.  She understands and consents to proceed.  Seen in lab prior to procedure.

## 2012-04-04 NOTE — Progress Notes (Addendum)
Patient: [name] Date of Encounter: 04/04/2012, 3:29 PM Admit date: 04/02/2012     Subjective  No CP or SOB. Currently no complaints. Cardiac cath was reviewed.    Objective   Telemetry: NSR.   Physical Exam: Filed Vitals:   04/04/12 1346  BP: 94/70  Pulse: 82  Temp: 98.2 F (36.8 C)  Resp: 20   General: Well developed, well nourished, in no acute distress. Head: Normocephalic, atraumatic, sclera non-icteric, no xanthomas, nares are without discharge.  Neck: JVD not elevated. Lungs: Clear anteriorly bilaterally to auscultation without wheezes, rales, or rhonchi. Breathing is unlabored. Heart: RRR S1 S2 without murmurs, rubs, or gallops.  Abdomen: Soft, non-tender, non-distended with normoactive bowel sounds. No hepatomegaly. No rebound/guarding. No obvious abdominal masses. Msk:  Strength and tone appear normal for age. Extremities: No clubbing or cyanosis. Legs are slightly warm but no edema.  Distal pedal pulses are diminished bilaterally. Neuro: Alert and oriented X 3. Moves all extremities spontaneously. Psych:  Responds to questions appropriately with a normal affect. R Groin: no hematoma or bruit.     Intake/Output Summary (Last 24 hours) at 04/04/12 1529 Last data filed at 04/04/12 1100  Gross per 24 hour  Intake   1080 ml  Output    400 ml  Net    680 ml    Inpatient Medications:     . allopurinol  300 mg Oral Daily  . atorvastatin  80 mg Oral q1800  . carvedilol  6.25 mg Oral BID WC  . dipyridamole-aspirin  1 capsule Oral BID  . ferrous sulfate  325 mg Oral TID WC  . glyBURIDE  5 mg Oral BID WC  . insulin aspart  0-15 Units Subcutaneous TID WC  . insulin aspart  0-5 Units Subcutaneous QHS  . lamoTRIgine  100 mg Oral BID  . pantoprazole  40 mg Oral BID AC  . ranolazine  500 mg Oral BID    Labs:  [facility] 04/03/12 0620 04/02/12 1200  NA 136 136  K 4.2 4.4  CL 99 96  CO2 23 29  GLUCOSE 184* 191*  BUN 26* 24*  CREATININE 0.98 1.03    CALCIUM 9.3 9.6  MG -- --  PHOS -- --    Basename 04/02/12 1200  WBC 8.5  NEUTROABS --  HGB 11.9*  HCT 36.1  MCV 90.5  PLT 273    Basename 04/03/12 0620  CHOL 227*  HDL 48  LDLCALC 125*  TRIG 268*  CHOLHDL 4.7    Radiology/Studies: No results found for [place]   Assessment and Plan  78 y/o F with hx CAD s/p CABG 1997, CHF EF 40-45% 09/2011, CVA, DM presented from [facility] with complaints of CP found to have an NSTEMI.  1. NSTEMI with known CAD s/p CABG -Cardiac cath results noted. I reviewed the images. No good revascularization option as both distal LAD and LCX are diffusely diseased. EF looked around 30%.  Continue ASA, BB, statin, Plavix,  I add Ranexa today.   2. LV dysfunction with EF 40-45% - euvolemic by brief exam this AM. h/o intolerance to ACEI due to hyperkalemia. Check 2  D Echo today to evaluate EF. Will consider small dose ARB but BP is relatively low.    3. Hyperlipidemia - on Red Yeast Rice at home. Agree with high dose Lipitor.  f/u LFTs/lipids 6 weeks.   4. H/o CVA - on aggrenox PTA. She has been having hard time tolerating this medication. Due to  that and her recent NSTEMI, I will switch her to Aspirin 81 mg daily and Plavix 75 mg daily.   Can likely discharge home tomorrow. Will need close follow up with Dr. Diona Browner as I no longer go to [facility] office.   Signed,  Lorine Bears , MD, [facility]

## 2012-04-05 ENCOUNTER — Encounter (HOSPITAL_COMMUNITY): Payer: Self-pay | Admitting: Cardiology

## 2012-04-05 DIAGNOSIS — I214 Non-ST elevation (NSTEMI) myocardial infarction: Secondary | ICD-10-CM | POA: Diagnosis present

## 2012-04-05 DIAGNOSIS — E785 Hyperlipidemia, unspecified: Secondary | ICD-10-CM

## 2012-04-05 DIAGNOSIS — I359 Nonrheumatic aortic valve disorder, unspecified: Secondary | ICD-10-CM

## 2012-04-05 LAB — BASIC METABOLIC PANEL
BUN: 20 mg/dL (ref 6–23)
CO2: 26 mEq/L (ref 19–32)
Calcium: 9.6 mg/dL (ref 8.4–10.5)
Creatinine, Ser: 0.91 mg/dL (ref 0.50–1.10)
Glucose, Bld: 126 mg/dL — ABNORMAL HIGH (ref 70–99)

## 2012-04-05 LAB — GLUCOSE, CAPILLARY
Glucose-Capillary: 116 mg/dL — ABNORMAL HIGH (ref 70–99)
Glucose-Capillary: 230 mg/dL — ABNORMAL HIGH (ref 70–99)

## 2012-04-05 MED ORDER — NITROGLYCERIN 0.4 MG SL SUBL: 0.4000 mg | SUBLINGUAL_TABLET | SUBLINGUAL | Status: DC | PRN

## 2012-04-05 MED ORDER — ASPIRIN 81 MG PO CHEW: 81.0000 mg | CHEWABLE_TABLET | Freq: Every day | ORAL | Status: AC

## 2012-04-05 MED ORDER — ATORVASTATIN CALCIUM 80 MG PO TABS: 80.0000 mg | ORAL_TABLET | Freq: Every day | ORAL | Status: DC

## 2012-04-05 MED ORDER — RANOLAZINE ER 500 MG PO TB12: 500.0000 mg | ORAL_TABLET | Freq: Two times a day (BID) | ORAL | Status: DC

## 2012-04-05 MED ORDER — CARVEDILOL 6.25 MG PO TABS: 6.2500 mg | ORAL_TABLET | Freq: Two times a day (BID) | ORAL | Status: DC

## 2012-04-05 MED ORDER — METFORMIN HCL 500 MG PO TABS: 500.0000 mg | ORAL_TABLET | Freq: Two times a day (BID) | ORAL | Status: DC

## 2012-04-05 MED ORDER — CLOPIDOGREL BISULFATE 75 MG PO TABS: 75.0000 mg | ORAL_TABLET | Freq: Every day | ORAL | Status: AC

## 2012-04-05 NOTE — Discharge Summary (Signed)
Discharge Summary   Patient ID: [name] MRN: [id number], DOB/AGE: 1931-04-10 78 y.o.  Primary MD: Ignatius Specking., MD Primary Cardiologist: Dr. Diona Browner in [place] Admit date: 04/02/2012 D/C date:     04/05/2012      Primary Discharge Diagnoses:  1. NSTEMI/CAD  -  s/p 4V CABG '97, cath 11/2010 patent grafts  - Cardiac cath 04/03/12 revealed a new occlusion of SVG to OM system, Medical therapy  - Atenolol changed to coreg, ASA, Plavix, Statin and Ranexa initiated   2. Hyperlipidemia  - LDL 125, Trigs 268, Statin initiated this admission  - Will need f/u Lipids/LFTs 6-8wks  3. Chronic Mixed CHF/Ischemic CM   - Echo 04/05/12 EF 40-45%, anterolateral/posterior hypokinesis, grade 1 diastolic dysfunction, mild MR, mildly dilated LA  - H/o hyperkalemia on ACEI, consider addition of ARB as outpatient   Secondary Discharge Diagnoses:  1. HTN 2. Diabetes Mellitus, Type 2 3. CVA - Aggrenox dc'd this admission 4. Seizure Disorder 5. Gout 6. Bilateral Knee surgery 7. Left Hip surgery 8. Right great toe amputation   Allergies Allergies  Allergen Reactions  . Ace Inhibitors     Hyperkalemia  . Cephalexin     Reaction unknown-but on pt's list of medications that she was told she was very allergic to.  . Furosemide Other (See Comments)    presyncope  . Levaquin Swelling  . Bactrim Rash  . Ciprocin-Fluocin-Procin (Fluocinolone Acetonide) Rash  . Ciprofloxacin Rash  . Codeine Rash  . Morphine And Related Rash  . Pentazocine Rash  . Promethazine Swelling and Rash  . Sulfa Antibiotics Swelling and Rash  . Trimethoprim Swelling and Rash    Diagnostic Studies/Procedures:   04/03/12 - Cardiac Cath Hemodynamics:  AO 164/74 (112)  LV 169/17  Coronary angiography:  Coronary dominance: right  Left mainstem: No critical disease  Left anterior descending (LAD): Severely diseased at the ostium then totally occluded  Left circumflex (LCx): The circumflex provides a ramus with 75-80%  proximal disease. The distal vessel is small to moderate. The OM2 is small and severely diseased. The OM3 has about 50-70% segmental proximal disease. The OM4 is segmentally plaqued leading into OM4. It is probably 50-70%.  Right coronary artery (RCA): Large caliber vessel with 20% proximal. There is a 60% distal stenosis. The PDA has competitive flow. The PLA 1 is intact. There is 70% segmental disease leading into PLA 2.  The SVG to the PDA is widely patent. The PDA distally has 40% narrowing. There is some collateralization of the OM system.  The SVG to the OM system is occluded proximally, new from the prior study.  The LIMA to the LAD is widely patent. The LAD is smaller in caliber and thread like distally. The LIMA back fills the LAD into the diagonal, and the diagonal looks like it has high grade ostial disease.  Left ventriculography: Left ventricular systolic function is reduced. There is distal inferior and apical hypokinesis. EF estimate 45%. The aortic valve opens well.  Final Conclusions:  1. Patent IMA to the LAD with severe distal disease  2. Patent SVG to PDA with other RCA disease  3. New occlusion of SVG to OM system  4. Moderately reduced LV function as noted.  Recommendations:  1. With prior CVA, and current anatomy, would lean toward medical therapy. Nothing is ideal for PCI and she is not a redo candidate per se. We are working to get her medications resolved.   04/05/12 - 2D Echocardiogram Study Conclusions: -  Left ventricle: The cavity size was normal. Wall thickness was normal. Systolic function was mildly to moderately reduced. The estimated ejection fraction was in the range of 40% to 45%. Anterolateral and posterior hypokinesis. Doppler parameters are consistent with abnormal left ventricular relaxation (grade 1 diastolic dysfunction). - Aortic valve: There was no stenosis. - Mitral valve: Mild regurgitation. - Left atrium: The atrium was mildly dilated. - Right  ventricle: The cavity size was normal. Systolic function was normal. - Pulmonary arteries: PA peak pressure: 33mm Hg (S). - Inferior vena cava: The vessel was normal in size; the respirophasic diameter changes were in the normal range (= 50%); findings are consistent with normal central venous pressure.  Impressions: Normal LV size with EF 40-45%. Posterior and anterolateral hypokinesis. Normal RV size and systolic function. Mild mitral regurgitation.  History of Present Illness: 78 y.o. female w/ the above medical problems who presented to [facility] on 04/02/12 with complaints of chest pain and found to have a NSTEMI for which she was transferred to [facility] for further evaluation and treatment  On the night prior to presentation she had an episode of chest discomfort at rest, described as a sharp pain that was 10 out of 10 in severity associated with left arm numbness. She took several Tums without any relief and subsequently presented to the emergency room where she became chest pain free after 2 sublingual nitroglycerin.   Hospital Course: In [facility] ED, EKG revealed NSR w/ PVCs, RBBB, no significant ST changes. Cardiac markers showed CK 90, CKMB 7.8, and troponinI 1.4. She was placed on heparin and transferred to [facility] for further evaluation and treatment of NSTEMI.   She underwent cardiac cath on 04/03/12 revealing a new occluded SVG to OM. It was felt she did not have good revascularization options and will therefore be treated medically. She tolerated the procedure well without complications. Medication changes include switching from Atenolol to Coreg, adding a statin and Ranexa, and changing Aggrenox to ASA and Plavix. She has a history of hyperkalemia on ACEI. Addition of an ARB as an outpatient can be considered. Echocardiogram on 04/05/12 revealed an EF 40-45%, anterolateral/posterior hypokinesis, grade 1 diastolic dysfunction, mild MR, and mildly dilated  LA. She ambulated without any further chest pain. Discharge weight 158lbs (71.7kg).  He was seen and evaluated by Dr. Elease Hashimoto who felt he was stable for discharge home with plans for follow up as scheduled below.  Discharge Vitals: Blood pressure 137/76, pulse 81, temperature 97.9 F (36.6 C), temperature source Oral, resp. rate 18, height 5' 1.8" (1.57 m), weight 158 lb 1.1 oz (71.7 kg), SpO2 96.00%.  Labs: Component Value Date   WBC 8.5 04/02/2012   HGB 11.9* 04/02/2012   HCT 36.1 04/02/2012   MCV 90.5 04/02/2012   PLT 273 04/02/2012    Lab 04/05/12 0535 04/03/12 0620  NA 137 --  K 4.3 --  CL 101 --  CO2 26 --  BUN 20 --  CREATININE 0.91 --  CALCIUM 9.6 --  PROT -- 6.8  BILITOT -- 0.2*  ALKPHOS -- 61  ALT -- 8  AST -- 20  GLUCOSE 126* --   Component Value Date   CHOL 227* 04/03/2012   HDL 48 04/03/2012   LDLCALC 409* 04/03/2012   TRIG 268* 04/03/2012     04/03/2012 06:20  Prothrombin Time 12.4  INR 0.91    Discharge Medications   Medication List  As of 04/05/2012  3:07 PM  STOP taking these medications         AGGRENOX 25-200 MG per 12 hr capsule      atenolol 50 MG tablet      RED YEAST RICE EXTRACT PO         TAKE these medications         allopurinol 300 MG tablet   Commonly known as: ZYLOPRIM   Take 300 mg by mouth daily.      aspirin 81 MG chewable tablet   Chew 1 tablet (81 mg total) by mouth daily.      atorvastatin 80 MG tablet   Commonly known as: LIPITOR   Take 1 tablet (80 mg total) by mouth daily.      Biotin 1000 MCG tablet   Take 1,000 mcg by mouth daily.      carvedilol 6.25 MG tablet   Commonly known as: COREG   Take 1 tablet (6.25 mg total) by mouth 2 (two) times daily with a meal.      cholecalciferol 1000 UNITS tablet   Commonly known as: VITAMIN D   Take 1,000 Units by mouth daily.      CITRACAL CALCIUM+D 600-40-500 MG-MG-UNIT Tb24   Generic drug: Calcium-Magnesium-Vitamin D   Take 2 tablets by mouth 2 (two) times daily.       clopidogrel 75 MG tablet   Commonly known as: PLAVIX   Take 1 tablet (75 mg total) by mouth daily.      desoximetasone 0.25 % cream   Commonly known as: TOPICORT   Apply 1 application topically 2 (two) times daily as needed. Itching        diphenhydramine-acetaminophen 25-500 MG Tabs   Commonly known as: TYLENOL PM   Take 1 tablet by mouth at bedtime as needed. For sleep      ferrous sulfate 325 (65 FE) MG tablet   Take 325 mg by mouth 3 (three) times daily with meals.      fish oil-omega-3 fatty acids 1000 MG capsule   Take 2 g by mouth daily.      glyBURIDE 5 MG tablet   Commonly known as: DIABETA   Take 5 mg by mouth 2 (two) times daily with a meal.      lamoTRIgine 100 MG tablet   Commonly known as: LAMICTAL   Take 100 mg by mouth 2 (two) times daily.      metFORMIN 500 MG tablet   Commonly known as: GLUCOPHAGE   Take 1 tablet (500 mg total) by mouth 2 (two) times daily with a meal. Please hold for 48hrs after heart cath. Resume on 04/06/12      methocarbamol 500 MG tablet   Commonly known as: ROBAXIN   Take 750 mg by mouth 3 (three) times daily as needed. For muscle spasms      nitroGLYCERIN 0.4 MG SL tablet   Commonly known as: NITROSTAT   Place 1 tablet (0.4 mg total) under the tongue every 5 (five) minutes x 3 doses as needed for chest pain (up to 3 doses).      omeprazole 20 MG capsule   Commonly known as: PRILOSEC   Take 20 mg by mouth 2 (two) times daily.      ranolazine 500 MG 12 hr tablet   Commonly known as: RANEXA   Take 1 tablet (500 mg total) by mouth 2 (two) times daily.      tetrahydrozoline 0.05 % ophthalmic solution   Place 1 drop into both eyes 2 (  two) times daily. Same as Visine eye drops (over the counter)      torsemide 20 MG tablet   Commonly known as: DEMADEX   Take 20 mg by mouth Daily.            Disposition   Discharge Orders    Future Appointments: Provider: Department: Dept Phone: Center:   04/27/2012 1:30 PM Sherrie George, MD [facility] [phone] None   04/28/2012 1:00 PM Rande Brunt, PA [facility] [phone] [facility]     Future Orders Please Complete By Expires   Diet - low sodium heart healthy      Increase activity slowly      Discharge instructions      Comments:   **PLEASE REMEMBER TO BRING ALL OF YOUR MEDICATIONS TO EACH OF YOUR FOLLOW-UP OFFICE VISITS.  * KEEP GROIN SITE CLEAN AND DRY. Call the office for any signs of bleedings, pus, swelling, increased pain, or any other concerns. * NO HEAVY LIFTING (>10lbs) OR SEXUAL ACTIVITY X 6 DAYS. * NO DRIVING X 1-2 DAYS. * NO SOAKING BATHS, HOT TUBS, POOLS, ETC., X 6 DAYS.  * You were started on a cholesterol medication called a statin. You will need follow up blood work in 6-8wks (Lipid panel and Liver function test)      Follow-up Information    Follow up with Gene Serpe, PA on 04/28/2012. (1:00)    Contact information:   [facility] [address] [place] [address] [phone]           Outstanding Labs/Studies:  1. Patient will require follow up lipid panel and liver function tests in 6-8 weeks as we initiated a new statin during this hospitalization    Duration of Discharge Encounter: Greater than 30 minutes including physician and PA time.  Signed, HOPE, JESSICA PA-C 04/05/2012, 3:07 PM  Attending Note:   The patient was seen and examined.  Agree with assessment and plan as noted above.  See my note from earlier today.  [name], Montez Hageman., MD, [facility] 04/05/2012, 3:55 PM

## 2012-04-05 NOTE — Progress Notes (Signed)
PROGRESS NOTE  Subjective:   [name] is a 78 yo with CAD/CABG.  Admitted with NSTEMI.  Cath revealed a new occlusion ( SVG to OM)  .  The decision is to treat medically.  No further cp.  Breathing well and ambulated without problems.  Had echo today - results pending.  Last EF was 30%.    Objective:    Vital Signs:   Temp:  [97.5 F (36.4 C)-98.2 F (36.8 C)] 97.6 F (36.4 C) (04/11 0410) Pulse Rate:  [80-94] 94  (04/11 0410) Resp:  [18-20] 18  (04/11 0410) BP: (94-169)/(70-79) 155/78 mmHg (04/11 0410) SpO2:  [97 %-98 %] 97 % (04/11 0410) Weight:  [158 lb 1.1 oz (71.7 kg)] 158 lb 1.1 oz (71.7 kg) (04/11 0615)  Last BM Date: 04/04/12   24-hour weight change: Weight change: -1 lb 2.1 oz (-0.513 kg)  Weight trends: Filed Weights   04/03/12 0300 04/04/12 0642 04/05/12 0615  Weight: 159 lb 2.8 oz (72.2 kg) 159 lb 3.2 oz (72.213 kg) 158 lb 1.1 oz (71.7 kg)    Intake/Output:  04/10 0701 - 04/11 0700 In: 360 [P.O.:360] Out: 800 [Urine:800]     Physical Exam: BP 155/78  Pulse 94  Temp(Src) 97.6 F (36.4 C) (Oral)  Resp 18  Ht 5' 1.8" (1.57 m)  Wt 158 lb 1.1 oz (71.7 kg)  BMI 29.10 kg/m2  SpO2 97%  General: Vital signs reviewed and noted. Well-developed, well-nourished, in no acute distress; alert, appropriate and cooperative throughout examination.  Head: Normocephalic, atraumatic.  Eyes: conjunctivae/corneas clear. PERRL, EOM's intact. Fundi benign.  Throat: Oropharynx nonerythematous, no exudate appreciated.   Neck: Supple. Normal carotids. No JVD  Lungs:  Clear bilaterally to auscultation without wheezes, rales, or rhonchi. Breathing is unlabored.  Heart: Regular rate,  With normal  S1 S2. No murmurs, rubs, or gallops   Abdomen:  Soft, non-tender, non-distended with normoactive bowel sounds. No hepatomegaly. No rebound/guarding. No abdominal masses.  Extremities: No edema.  Distal pedal pulses are 2+ and equal bilaterally.  Neurologic: A&O X3, CN II - XII  are grossly intact. Motor strength is 5/5 in the all 4 extremities.  Psych: Responds to questions appropriately with normal affect.    Labs: BMET:  Basename 04/05/12 0535 04/03/12 0620  NA 137 136  K 4.3 4.2  CL 101 99  CO2 26 23  GLUCOSE 126* 184*  BUN 20 26*  CREATININE 0.91 0.98  CALCIUM 9.6 9.3  MG -- --  PHOS -- --    Liver function tests:  Basename 04/03/12 0620  AST 20  ALT 8  ALKPHOS 61  BILITOT 0.2*  PROT 6.8  ALBUMIN 3.5   No results found for this basename: LIPASE:2,AMYLASE:2 in the last 72 hours  CBC:  Basename 04/02/12 1200  WBC 8.5  NEUTROABS --  HGB 11.9*  HCT 36.1  MCV 90.5  PLT 273    Cardiac Enzymes: No results found for this basename: CKTOTAL:4,CKMB:4,TROPONINI:4 in the last 72 hours  Coagulation Studies:  Basename 04/03/12 0620  LABPROT 12.4  INR 0.91    Other: No components found with this basename: POCBNP:3 No results found for this basename: DDIMER in the last 72 hours No results found for this basename: HGBA1C in the last 72 hours  Basename 04/03/12 0620  CHOL 227*  HDL 48  LDLCALC 125*  TRIG 268*  CHOLHDL 4.7   No results found for this basename: TSH,T4TOTAL,FREET3,T3FREE,THYROIDAB in the last 72 hours No results found for this  basename: VITAMINB12,FOLATE,FERRITIN,TIBC,IRON,RETICCTPCT in the last 72 hours    Tele:  NSR  Medications:    Infusions:    Scheduled Medications:    . allopurinol  300 mg Oral Daily  . aspirin  81 mg Oral Daily  . atorvastatin  80 mg Oral q1800  . carvedilol  6.25 mg Oral BID WC  . clopidogrel  75 mg Oral Q breakfast  . ferrous sulfate  325 mg Oral TID WC  . glyBURIDE  5 mg Oral BID WC  . insulin aspart  0-15 Units Subcutaneous TID WC  . insulin aspart  0-5 Units Subcutaneous QHS  . lamoTRIgine  100 mg Oral BID  . pantoprazole  40 mg Oral BID AC  . ranolazine  500 mg Oral BID  . DISCONTD: dipyridamole-aspirin  1 capsule Oral BID    Assessment/ Plan:    1. CAD / NSTEMI:   Doing well.  Ambulating without problems.  Echo today.  Continue current meds.  Close follow up with Dr. Diona Browner in [place]   Disposition: DC to home today after echo. Length of Stay: 3  [name], Montez Hageman., MD, [facility] 04/05/2012, 10:33 AM

## 2012-04-05 NOTE — Progress Notes (Addendum)
  Echocardiogram 2D Echocardiogram has been performed.  Elbie Statzer L 04/05/2012, 10:03 AM

## 2012-04-20 ENCOUNTER — Encounter: Payer: Self-pay | Admitting: *Deleted

## 2012-04-20 ENCOUNTER — Telehealth: Payer: Self-pay | Admitting: *Deleted

## 2012-04-20 NOTE — Telephone Encounter (Signed)
Pt's sister called stating pt has been complaining of worsening dizziness. She states blood sugar and blood pressure have been good. She thinks pt's HR has also been good. Pt has not complained of any chest pain or shortness of breath, only the dizziness. Pt's sister states pt has dizziness at times but it seems to be worse now. She will have primary MD evaluate pt for worsening dizziness.

## 2012-04-27 ENCOUNTER — Ambulatory Visit (INDEPENDENT_AMBULATORY_CARE_PROVIDER_SITE_OTHER): Payer: Medicare Other | Admitting: Ophthalmology

## 2012-04-27 DIAGNOSIS — E11319 Type 2 diabetes mellitus with unspecified diabetic retinopathy without macular edema: Secondary | ICD-10-CM

## 2012-04-27 DIAGNOSIS — H3581 Retinal edema: Secondary | ICD-10-CM

## 2012-04-27 DIAGNOSIS — E11359 Type 2 diabetes mellitus with proliferative diabetic retinopathy without macular edema: Secondary | ICD-10-CM

## 2012-04-28 ENCOUNTER — Encounter: Payer: Medicare Other | Admitting: Physician Assistant

## 2012-05-16 ENCOUNTER — Ambulatory Visit (INDEPENDENT_AMBULATORY_CARE_PROVIDER_SITE_OTHER): Payer: Medicare Other | Admitting: Cardiology

## 2012-05-16 ENCOUNTER — Encounter: Payer: Self-pay | Admitting: Cardiology

## 2012-05-16 VITALS — BP 154/76 | HR 76 | Resp 16 | Ht 62.0 in | Wt 170.0 lb

## 2012-05-16 DIAGNOSIS — E785 Hyperlipidemia, unspecified: Secondary | ICD-10-CM

## 2012-05-16 DIAGNOSIS — I10 Essential (primary) hypertension: Secondary | ICD-10-CM

## 2012-05-16 DIAGNOSIS — I2510 Atherosclerotic heart disease of native coronary artery without angina pectoris: Secondary | ICD-10-CM

## 2012-05-16 MED ORDER — ISOSORBIDE MONONITRATE ER 30 MG PO TB24: 30.0000 mg | ORAL_TABLET | Freq: Every day | ORAL | Status: DC

## 2012-05-16 NOTE — Assessment & Plan Note (Signed)
Blood pressure mildly elevated today. She has had hyperkalemia on ACE inhibitors in the past. Norvasc would also be a consideration, could serve as an antianginal as well.

## 2012-05-16 NOTE — Progress Notes (Signed)
Clinical Summary [name] is a medically complex 78 y.o.female presenting for post hospital followup. She has been followed in the past by [name], more recently [name]. I saw her in consultation in [name] back in April with an NSTEMI. She was transferred to [name] and underwent cardiac catheterization that revealed a newly occluded SVG to OM, with plan for medical therapy. LVEF in the range of 40-45%. Medication changes included switching from Atenolol to Coreg, adding a statin and Ranexa, and changing Aggrenox to ASA and Plavix. She has a history of hyperkalemia on ACEI. She had been scheduled to followup with [name] earlier, however presents today for a visit.  Lab work from April included potassium 4.3, BUN 20, creatinine 0.9, cholesterol 227, triglycerides 268, HDL 48, LDL 125, AST 20, ALT 8.  She is here with her daughter today. She reports intermittent chest pain symptoms, some resolved with TUMS, others with nitroglycerin. She is concerned about possible side effects on Ranexa. Otherwise reports compliance with her cardiac medications.   We reviewed the findings of her cardiac catheterization, and plan for medical therapy.   Allergies  Allergen Reactions  . Ace Inhibitors     Hyperkalemia  . Cephalexin     Reaction unknown-but on pt's list of medications that she was told she was very allergic to.  . Furosemide Other (See Comments)    presyncope  . Levofloxacin Swelling  . Bactrim Rash  . Ciprocin-Fluocin-Procin (Fluocinolone Acetonide) Rash  . Ciprofloxacin Rash  . Codeine Rash  . Morphine And Related Rash  . Pentazocine Rash  . Promethazine Swelling and Rash  . Sulfa Antibiotics Swelling and Rash  . Trimethoprim Swelling and Rash    Current Outpatient Prescriptions  Medication Sig Dispense Refill  . allopurinol (ZYLOPRIM) 300 MG tablet Take 300 mg by mouth daily.       Marland Kitchen aspirin 81 MG chewable tablet Chew 1 tablet (81 mg total) by mouth daily.      Marland Kitchen  atorvastatin (LIPITOR) 80 MG tablet Take 1 tablet (80 mg total) by mouth daily.  30 tablet  3  . Biotin 1000 MCG tablet Take 1,000 mcg by mouth daily.       . Calcium-Magnesium-Vitamin D (CITRACAL CALCIUM+D) 600-40-500 MG-MG-UNIT TB24 Take 2 tablets by mouth 2 (two) times daily.       . carvedilol (COREG) 6.25 MG tablet Take 1 tablet (6.25 mg total) by mouth 2 (two) times daily with a meal.  60 tablet  6  . cholecalciferol (VITAMIN D) 1000 UNITS tablet Take 1,000 Units by mouth daily.      . clopidogrel (PLAVIX) 75 MG tablet Take 1 tablet (75 mg total) by mouth daily.  30 tablet  6  . desoximetasone (TOPICORT) 0.25 % cream Apply 1 application topically 2 (two) times daily as needed. Itching       . diphenhydramine-acetaminophen (TYLENOL PM) 25-500 MG TABS Take 1 tablet by mouth at bedtime as needed. For sleep      . ferrous sulfate 325 (65 FE) MG tablet Take 325 mg by mouth 3 (three) times daily with meals.      . fish oil-omega-3 fatty acids 1000 MG capsule Take 2 g by mouth daily.       Marland Kitchen glyBURIDE (DIABETA) 5 MG tablet Take 5 mg by mouth 2 (two) times daily with a meal.       . lamoTRIgine (LAMICTAL) 100 MG tablet Take 100 mg by mouth 2 (two) times daily.       Marland Kitchen  meclizine (ANTIVERT) 12.5 MG tablet Take 12.5 mg by mouth 2 (two) times daily.      . metFORMIN (GLUCOPHAGE) 500 MG tablet Take 1 tablet (500 mg total) by mouth 2 (two) times daily with a meal. Please hold for 48hrs after heart cath. Resume on 04/06/12      . methocarbamol (ROBAXIN) 500 MG tablet Take 750 mg by mouth 3 (three) times daily as needed. For muscle spasms      . nitroGLYCERIN (NITROSTAT) 0.4 MG SL tablet Place 1 tablet (0.4 mg total) under the tongue every 5 (five) minutes x 3 doses as needed for chest pain (up to 3 doses).  25 tablet  3  . omeprazole (PRILOSEC) 20 MG capsule Take 20 mg by mouth 2 (two) times daily.       Marland Kitchen tetrahydrozoline 0.05 % ophthalmic solution Place 1 drop into both eyes 2 (two) times daily. Same as  Visine eye drops (over the counter)      . torsemide (DEMADEX) 20 MG tablet Take 20 mg by mouth Daily.       . isosorbide mononitrate (IMDUR) 30 MG 24 hr tablet Take 1 tablet (30 mg total) by mouth daily.  90 tablet  3    Past Medical History  Diagnosis Date  . Hypertension   . Gout   . GERD (gastroesophageal reflux disease)   . Hyperkalemia   . Systolic and diastolic CHF, chronic     Echo 04/05/12 EF 40-45%, anterolateral/posterior hypokinesis, grade 1 diastolic dysfunction, mild MR, mildly dilated LA  . Hyperlipidemia     initiated on statin 03/2012  . Coronary artery disease     s/p 4V CABG '97; NSTEMI -->Cardiac cath 04/03/12 revealed a new occlusion of SVG to OM system, Medical therapy  . Diabetes mellitus, type 2     oral meds - no insulin  . Seizures     2001 and poss seizure aug 2012--pt fell-suffered broken hip  . Arthritis     s/p left  hip orif --has a lot of pain in hip  . Osteoporosis   . Retinal hemorrhage, both eyes     states laser of both eyes in the past  . Stroke     1980 and then 2 strokes 2012  residual effects--rt sided facial weakness and rt hand weakness and  rt foot "turns over"    DR. Sandria Proctor IS PT'S NEUROLOGIST    Past Surgical History  Procedure Date  . Coronary artery bypass graft     1997  . Knee surgery     bilateral  . Abdominal hysterectomy   . Cardiac catheterization     11/2010-patent grafts  . Rt foot surgery     big toe removed and other surgeries on rt foot because of infection  . Left hip hemi-arthroplasty 07-31-2011    SURGERY AT [name] FOR FRACTURE LEFT FEMORAL NECK  . Total hip revision 01/23/2012    Procedure: TOTAL HIP REVISION;  Surgeon: Tiffany Pal, MD;  Location: [facility];  Service: Orthopedics;  Laterality: Left;  Conversion of  Previous Surgery to a Left Total Hip  . Abdominal hysterectomy     Family History  Problem Relation Age of Onset  . Heart attack Father 69  . Heart disease Father     Social History [name]  reports that she has never smoked. She has never used smokeless tobacco. [name] reports that she does not drink alcohol.  Review of Systems No falls, palpitations. Has had  some intermittent dizziness, being treated for vertigo. Also intermittent nausea. Intermittent lower extremity edema. Otherwise negative.  Physical Examination Filed Vitals:   05/16/12 1430  BP: 154/76  Pulse: 76  Resp: 16   Elderly woman, no acute distress. HEENT: Conjunctiva and lids normal, oropharynx clear. Neck: Supple, no elevated JVP or carotid bruits, no thyromegaly. Lungs: Clear to auscultation, diminished, nonlabored breathing at rest. Cardiac: Regular rate and rhythm, no S3, 2/6 systolic murmur, no pericardial rub. Abdomen: Soft, nontender, bowel sounds present, no guarding or rebound. Extremities: Trace edema, distal pulses 1-2+. Skin: Warm and dry. Musculoskeletal: No kyphosis. Neuropsychiatric: Alert and oriented x3, affect grossly appropriate.   Problem List and Plan   Coronary atherosclerosis of native coronary artery Intermittent chest pain, at least some of which is angina. She has multivessel and graft disease, to be managed medically at this time. Would like to try a switch from Ranexa to Imdur to see if she tolerates it better.  Essential hypertension, benign Blood pressure mildly elevated today. She has had hyperkalemia on ACE inhibitors in the past. Norvasc would also be a consideration, could serve as an antianginal as well.  Hyperlipidemia States that she had lab work with Dr. Sherril Croon recently. Will request for review.     Jonelle Sidle, M.D., F.A.C.C.

## 2012-05-16 NOTE — Patient Instructions (Signed)
   Finish current supply of Ranexa, then stop (removed from medication list).  After above, begin Imdur 30mg  daily Continue all other current medications.  Follow up in  3 months

## 2012-05-16 NOTE — Assessment & Plan Note (Signed)
Intermittent chest pain, at least some of which is angina. She has multivessel and graft disease, to be managed medically at this time. Would like to try a switch from Ranexa to Imdur to see if she tolerates it better.

## 2012-05-16 NOTE — Assessment & Plan Note (Signed)
States that she had lab work with Dr. Sherril Croon recently. Will request for review.

## 2012-05-29 ENCOUNTER — Ambulatory Visit: Payer: Medicare Other | Admitting: Cardiology

## 2012-08-07 ENCOUNTER — Telehealth: Payer: Self-pay | Admitting: *Deleted

## 2012-08-07 NOTE — Telephone Encounter (Signed)
Message left on nurse's voicemail that she had a tooth to break on yesterday and saw her dentist whom told her that can't treat her until they hear from cardiologist. Nurse spoke with patient and she informed nurse that dentist office requested that cardiologist decide what antibiotic to put her on. Nurse advised patient to have her dentist office send that request to our office to clarify request. Nurse advised patient that she should also have her dentist send this request to her PCP, [facility] office as well. Patient verbalized understanding of plan.

## 2012-08-10 ENCOUNTER — Encounter: Payer: Self-pay | Admitting: Cardiology

## 2012-08-10 ENCOUNTER — Ambulatory Visit (INDEPENDENT_AMBULATORY_CARE_PROVIDER_SITE_OTHER): Payer: Medicare Other | Admitting: Cardiology

## 2012-08-10 VITALS — BP 157/80 | HR 77 | Ht 62.0 in | Wt 157.0 lb

## 2012-08-10 DIAGNOSIS — I10 Essential (primary) hypertension: Secondary | ICD-10-CM

## 2012-08-10 DIAGNOSIS — I429 Cardiomyopathy, unspecified: Secondary | ICD-10-CM

## 2012-08-10 NOTE — Patient Instructions (Signed)
Your physician recommends that you schedule a follow-up appointment in: 3 months. Your physician recommends that you continue on your current medications as directed. Please refer to the Current Medication list given to you today.  Your physician recommends that you return for lab work in: today at [facility] for BMET and Mg levels.

## 2012-08-10 NOTE — Assessment & Plan Note (Signed)
Patient states that systolics have been 120 to 130 at home. Continue to keep a close eye on this. We can always adjust medications further if needed.

## 2012-08-10 NOTE — Progress Notes (Signed)
Clinical Summary [name] is a medically complex 78 y.o.female presenting for followup. She was seen in May. Actually, she has been relatively stable with occasional chest pain symptoms, responsive to nitroglycerin. She states that she has done better since switching from Ranexa to Imdur. She does have lower extremity edema, although has lost approximately 13 pounds since her last visit on Demadex. No followup lab work as yet.  She states that she broke a tooth in the interim, has been given antibiotics by Dr. Sherril Croon, and will be needing dental work in the near future.  We reviewed her medications. She reports compliance.   Allergies  Allergen Reactions  . Ace Inhibitors     Hyperkalemia  . Cephalexin     Reaction unknown-but on pt's list of medications that she was told she was very allergic to.  . Furosemide Other (See Comments)    presyncope  . Levofloxacin Swelling  . Bactrim Rash  . Ciprocin-Fluocin-Procin (Fluocinolone Acetonide) Rash  . Ciprofloxacin Rash  . Codeine Rash  . Morphine And Related Rash  . Pentazocine Rash  . Promethazine Swelling and Rash  . Sulfa Antibiotics Swelling and Rash  . Trimethoprim Swelling and Rash    Current Outpatient Prescriptions  Medication Sig Dispense Refill  . acetaminophen (TYLENOL) 500 MG tablet Take 500 mg by mouth every 6 (six) hours as needed.      Marland Kitchen allopurinol (ZYLOPRIM) 300 MG tablet Take 300 mg by mouth daily.       Marland Kitchen aspirin 81 MG chewable tablet Chew 1 tablet (81 mg total) by mouth daily.      Marland Kitchen atorvastatin (LIPITOR) 80 MG tablet Take 1 tablet (80 mg total) by mouth daily.  30 tablet  3  . Biotin 1000 MCG tablet Take 1,000 mcg by mouth daily.       . Bisacodyl (LAXATIVE PO) Take 1 tablet by mouth as needed.      . Calcium-Magnesium-Vitamin D (CITRACAL CALCIUM+D) 600-40-500 MG-MG-UNIT TB24 Take 2 tablets by mouth 2 (two) times daily.       . carvedilol (COREG) 6.25 MG tablet Take 1 tablet (6.25 mg total) by mouth 2 (two)  times daily with a meal.  60 tablet  6  . cholecalciferol (VITAMIN D) 1000 UNITS tablet Take 1,000 Units by mouth daily.      . clopidogrel (PLAVIX) 75 MG tablet Take 1 tablet (75 mg total) by mouth daily.  30 tablet  6  . desoximetasone (TOPICORT) 0.25 % cream Apply 1 application topically 2 (two) times daily as needed. Itching       . diphenhydramine-acetaminophen (TYLENOL PM) 25-500 MG TABS Take 1 tablet by mouth at bedtime as needed. For sleep      . ferrous sulfate 325 (65 FE) MG tablet Take 325 mg by mouth 3 (three) times daily with meals.      . fish oil-omega-3 fatty acids 1000 MG capsule Take 2 g by mouth daily.       Marland Kitchen glyBURIDE (DIABETA) 5 MG tablet Take 5 mg by mouth 2 (two) times daily with a meal.       . isosorbide mononitrate (IMDUR) 30 MG 24 hr tablet Take 1 tablet (30 mg total) by mouth daily.  90 tablet  3  . lamoTRIgine (LAMICTAL) 100 MG tablet Take 100 mg by mouth 2 (two) times daily.       . meclizine (ANTIVERT) 12.5 MG tablet Take 12.5 mg by mouth 2 (two) times daily.      Marland Kitchen  metFORMIN (GLUCOPHAGE) 500 MG tablet Take 1,000 mg by mouth 2 (two) times daily with a meal. Please hold for 48hrs after heart cath. Resume on 04/06/12      . methocarbamol (ROBAXIN) 500 MG tablet Take 750 mg by mouth 3 (three) times daily as needed. For muscle spasms      . nitroGLYCERIN (NITROSTAT) 0.4 MG SL tablet Place 1 tablet (0.4 mg total) under the tongue every 5 (five) minutes x 3 doses as needed for chest pain (up to 3 doses).  25 tablet  3  . omeprazole (PRILOSEC) 20 MG capsule Take 20 mg by mouth 2 (two) times daily.       Marland Kitchen tetrahydrozoline 0.05 % ophthalmic solution Place 1 drop into both eyes 2 (two) times daily. Same as Visine eye drops (over the counter)      . torsemide (DEMADEX) 20 MG tablet Take 20 mg by mouth Daily.         Past Medical History  Diagnosis Date  . Hypertension   . Gout   . GERD (gastroesophageal reflux disease)   . Hyperkalemia   . Systolic and diastolic CHF,  chronic     Echo 04/05/12 EF 40-45%, anterolateral/posterior hypokinesis, grade 1 diastolic dysfunction, mild MR, mildly dilated LA  . Hyperlipidemia     initiated on statin 03/2012  . Coronary artery disease     s/p 4V CABG '97; NSTEMI -->Cardiac cath 04/03/12 revealed a new occlusion of SVG to OM system, Medical therapy  . Diabetes mellitus, type 2     oral meds - no insulin  . Seizures     2001 and poss seizure aug 2012--pt fell-suffered broken hip  . Arthritis     s/p left  hip orif --has a lot of pain in hip  . Osteoporosis   . Retinal hemorrhage, both eyes     states laser of both eyes in the past  . Stroke     1980 and then 2 strokes 2012  residual effects--rt sided facial weakness and rt hand weakness and  rt foot "turns over"    DR. Sandria Manly IS PT'S NEUROLOGIST    Past Surgical History  Procedure Date  . Coronary artery bypass graft     1997  . Knee surgery     bilateral  . Abdominal hysterectomy   . Cardiac catheterization     11/2010-patent grafts  . Rt foot surgery     big toe removed and other surgeries on rt foot because of infection  . Left hip hemi-arthroplasty 07-31-2011    SURGERY AT [facility] FOR FRACTURE LEFT FEMORAL NECK  . Total hip revision 01/23/2012    Procedure: TOTAL HIP REVISION;  Surgeon: Shelda Pal, MD;  Location: [facility];  Service: Orthopedics;  Laterality: Left;  Conversion of  Previous Surgery to a Left Total Hip  . Abdominal hysterectomy     Social History [name] reports that she has never smoked. She has never used smokeless tobacco. [name] reports that she does not drink alcohol.  Review of Systems No palpitations or syncope. Reports stable appetite. No obvious bleeding problems.  Physical Examination Filed Vitals:   08/10/12 1414  BP: 157/80  Pulse: 77    Elderly woman, no acute distress.  HEENT: Conjunctiva and lids normal, oropharynx clear.  Neck: Supple, no elevated JVP or carotid bruits, no thyromegaly.  Lungs: Clear to  auscultation, diminished, nonlabored breathing at rest.  Cardiac: Regular rate and rhythm, no S3, 2/6 systolic murmur, no  pericardial rub.  Abdomen: Soft, nontender, bowel sounds present, no guarding or rebound.  Extremities: 1+ edema, distal pulses 1-2+.  Skin: Warm and dry.  Musculoskeletal: No kyphosis.  Neuropsychiatric: Alert and oriented x3, affect grossly appropriate.    Problem List and Plan   Coronary atherosclerosis of native coronary artery Relatively stable on medical therapy. I explained that we could increase Imdur further if needed for additional antianginal benefit. For now she seemed comfortable with symptom control. She is reportedly in need of dental work related to a broken tooth. It would be reasonable to hold her antiplatelet therapy, mainly Plavix, temporarily if needed. She did not undergo any specific coronary intervention at her catheterization earlier this year with NSTEMI. Otherwise continue her medical therapy. Office followup is arranged.  Cardiomyopathy secondary Still has some peripheral edema, although weight is down significantly on Demadex. No changes to current regimen as yet. Followup BMET and magnesium.  Essential hypertension, benign Patient states that systolics have been 120 to 130 at home. Continue to keep a close eye on this. We can always adjust medications further if needed.    Jonelle Sidle, M.D., F.A.C.C.

## 2012-08-10 NOTE — Assessment & Plan Note (Signed)
Relatively stable on medical therapy. I explained that we could increase Imdur further if needed for additional antianginal benefit. For now she seemed comfortable with symptom control. She is reportedly in need of dental work related to a broken tooth. It would be reasonable to hold her antiplatelet therapy, mainly Plavix, temporarily if needed. She did not undergo any specific coronary intervention at her catheterization earlier this year with NSTEMI. Otherwise continue her medical therapy. Office followup is arranged.

## 2012-08-10 NOTE — Assessment & Plan Note (Signed)
Still has some peripheral edema, although weight is down significantly on Demadex. No changes to current regimen as yet. Followup BMET and magnesium.

## 2012-08-13 ENCOUNTER — Telehealth: Payer: Self-pay | Admitting: *Deleted

## 2012-08-13 NOTE — Telephone Encounter (Signed)
Message copied by Eustace Moore on Mon Aug 13, 2012  4:05 PM ------      Message from: MCDOWELL, Illene Bolus      Created: Mon Aug 13, 2012  9:15 AM       Reviewed. Creatinine normal at 0.8, potassium normal at 4.7, magnesium acceptable at 1.8. No change to current regimen.

## 2012-08-13 NOTE — Telephone Encounter (Signed)
Patient informed. 

## 2012-08-31 ENCOUNTER — Ambulatory Visit (INDEPENDENT_AMBULATORY_CARE_PROVIDER_SITE_OTHER): Payer: Medicare Other | Admitting: Ophthalmology

## 2012-08-31 DIAGNOSIS — E11319 Type 2 diabetes mellitus with unspecified diabetic retinopathy without macular edema: Secondary | ICD-10-CM

## 2012-08-31 DIAGNOSIS — E11359 Type 2 diabetes mellitus with proliferative diabetic retinopathy without macular edema: Secondary | ICD-10-CM

## 2012-08-31 DIAGNOSIS — E1139 Type 2 diabetes mellitus with other diabetic ophthalmic complication: Secondary | ICD-10-CM

## 2012-08-31 DIAGNOSIS — H43819 Vitreous degeneration, unspecified eye: Secondary | ICD-10-CM

## 2012-12-07 ENCOUNTER — Encounter: Payer: Self-pay | Admitting: Cardiology

## 2012-12-07 ENCOUNTER — Ambulatory Visit (INDEPENDENT_AMBULATORY_CARE_PROVIDER_SITE_OTHER): Payer: Medicare Other | Admitting: Cardiology

## 2012-12-07 VITALS — BP 134/80 | HR 83 | Ht 62.0 in | Wt 153.0 lb

## 2012-12-07 DIAGNOSIS — I2510 Atherosclerotic heart disease of native coronary artery without angina pectoris: Secondary | ICD-10-CM

## 2012-12-07 DIAGNOSIS — I10 Essential (primary) hypertension: Secondary | ICD-10-CM

## 2012-12-07 NOTE — Assessment & Plan Note (Signed)
Blood pressure is reasonably well controlled. No changes made. 

## 2012-12-07 NOTE — Patient Instructions (Signed)
Your physician recommends that you schedule a follow-up appointment in: 6 months with Dr. Diona Browner  Continue your current medication.

## 2012-12-07 NOTE — Assessment & Plan Note (Signed)
Multivessel disease status post previous CABG with an occluded SVG to OM in the setting of NSTEMI earlier in the year. Plan is to continue medical therapy. She is doing well on the current regimen.

## 2012-12-07 NOTE — Progress Notes (Signed)
Clinical Summary [name] is a medically complex 78 y.o.female presenting for followup. She was seen back in August. She has been remarkably stable from a cardiac perspective. Reports no accelerating angina, does have angina when she climbs steps, which is infrequent. She uses a walker to ambulate.  Lab work from August showed BUN 20, creatinine 0.8, sodium 137, potassium 4.7. Weight is down 4 pounds from the last visit.   Allergies  Allergen Reactions  . Ace Inhibitors     Hyperkalemia  . Cephalexin     Reaction unknown-but on pt's list of medications that she was told she was very allergic to.  . Furosemide Other (See Comments)    presyncope  . Levofloxacin Swelling  . Bactrim Rash  . Ciprocin-Fluocin-Procin (Fluocinolone Acetonide) Rash  . Ciprofloxacin Rash  . Codeine Rash  . Morphine And Related Rash  . Pentazocine Rash  . Promethazine Swelling and Rash  . Sulfa Antibiotics Swelling and Rash  . Trimethoprim Swelling and Rash    Current Outpatient Prescriptions  Medication Sig Dispense Refill  . acetaminophen (TYLENOL) 500 MG tablet Take 500 mg by mouth every 6 (six) hours as needed.      Marland Kitchen allopurinol (ZYLOPRIM) 300 MG tablet Take 300 mg by mouth daily.       Marland Kitchen amoxicillin (AMOXIL) 500 MG capsule Take 2,000 mg by mouth as directed. Take 4 capsules 1 hour prior to dental procedure.      Marland Kitchen aspirin 81 MG chewable tablet Chew 1 tablet (81 mg total) by mouth daily.      Marland Kitchen atorvastatin (LIPITOR) 80 MG tablet Take 1 tablet (80 mg total) by mouth daily.  30 tablet  3  . Biotin 1000 MCG tablet Take 1,000 mcg by mouth daily.       . Bisacodyl (LAXATIVE PO) Take 1 tablet by mouth as needed.      . carvedilol (COREG) 6.25 MG tablet Take 1 tablet (6.25 mg total) by mouth 2 (two) times daily with a meal.  60 tablet  6  . cholecalciferol (VITAMIN D) 1000 UNITS tablet Take 1,000 Units by mouth daily.      . clopidogrel (PLAVIX) 75 MG tablet Take 1 tablet (75 mg total) by mouth daily.   30 tablet  6  . desoximetasone (TOPICORT) 0.25 % cream Apply 1 application topically 2 (two) times daily as needed. Itching       . diphenhydramine-acetaminophen (TYLENOL PM) 25-500 MG TABS Take 1 tablet by mouth at bedtime as needed. For sleep      . ferrous sulfate 325 (65 FE) MG tablet Take 325 mg by mouth 3 (three) times daily with meals.      Marland Kitchen glyBURIDE (DIABETA) 5 MG tablet Take 10 mg by mouth 2 (two) times daily with a meal.       . isosorbide mononitrate (IMDUR) 30 MG 24 hr tablet Take 1 tablet (30 mg total) by mouth daily.  90 tablet  3  . lamoTRIgine (LAMICTAL) 100 MG tablet Take 100 mg by mouth 2 (two) times daily.       . meclizine (ANTIVERT) 12.5 MG tablet Take 12.5 mg by mouth 3 (three) times daily as needed.       . nitroGLYCERIN (NITROSTAT) 0.4 MG SL tablet Place 1 tablet (0.4 mg total) under the tongue every 5 (five) minutes x 3 doses as needed for chest pain (up to 3 doses).  25 tablet  3  . omeprazole (PRILOSEC) 20 MG capsule Take 20 mg  by mouth 2 (two) times daily.       Marland Kitchen tetrahydrozoline 0.05 % ophthalmic solution Place 1 drop into both eyes 2 (two) times daily. Same as Visine eye drops (over the counter)      . torsemide (DEMADEX) 20 MG tablet Take 20 mg by mouth Daily.         Past Medical History  Diagnosis Date  . Hypertension   . Gout   . GERD (gastroesophageal reflux disease)   . Hyperkalemia   . Systolic and diastolic CHF, chronic     Echo 04/05/12 EF 40-45%, anterolateral/posterior hypokinesis, grade 1 diastolic dysfunction, mild MR, mildly dilated LA  . Hyperlipidemia     initiated on statin 03/2012  . Coronary artery disease     s/p 4V CABG '97; NSTEMI -->Cardiac cath 04/03/12 revealed a new occlusion of SVG to OM system, Medical therapy  . Diabetes mellitus, type 2     oral meds - no insulin  . Seizures     2001 and poss seizure aug 2012--pt fell-suffered broken hip  . Arthritis     s/p left  hip orif --has a lot of pain in hip  . Osteoporosis   .  Retinal hemorrhage, both eyes     states laser of both eyes in the past  . Stroke     1980 and then 2 strokes 2012  residual effects--rt sided facial weakness and rt hand weakness and  rt foot "turns over"    DR. Sandria Proctor IS PT'S NEUROLOGIST    Past Surgical History  Procedure Date  . Coronary artery bypass graft     1997  . Knee surgery     bilateral  . Abdominal hysterectomy   . Cardiac catheterization     11/2010-patent grafts  . Rt foot surgery     big toe removed and other surgeries on rt foot because of infection  . Left hip hemi-arthroplasty 07-31-2011    SURGERY AT [facility] FOR FRACTURE LEFT FEMORAL NECK  . Total hip revision 01/23/2012    Procedure: TOTAL HIP REVISION;  Surgeon: Tiffany Pal, MD;  Location: [facility];  Service: Orthopedics;  Laterality: Left;  Conversion of  Previous Surgery to a Left Total Hip  . Abdominal hysterectomy     Social History [name] reports that she has never smoked. She has never used smokeless tobacco. [name] reports that she does not drink alcohol.  Review of Systems No palpitations. No progressive edema. Appetite is good. No hospitalizations. Otherwise negative.  Physical Examination Filed Vitals:   12/07/12 1447  BP: 134/80  Pulse: 83   Filed Weights   12/07/12 1447  Weight: 153 lb (69.4 kg)   Elderly woman, no acute distress.  HEENT: Conjunctiva and lids normal, oropharynx clear.  Neck: Supple, no elevated JVP or carotid bruits, no thyromegaly.  Lungs: Clear to auscultation, diminished, nonlabored breathing at rest.  Cardiac: Regular rate and rhythm, no S3, 2/6 systolic murmur, no pericardial rub.  Abdomen: Soft, nontender, bowel sounds present, no guarding or rebound.  Extremities: 1+ edema, distal pulses 1-2+.    Problem List and Plan   Coronary atherosclerosis of native coronary artery Multivessel disease status post previous CABG with an occluded SVG to OM in the setting of NSTEMI earlier in the year. Plan is to  continue medical therapy. She is doing well on the current regimen.  Essential hypertension, benign Blood pressure is reasonably well controlled. No changes made.  Cardiomyopathy secondary LVEF 40-45%, weight is  down, no evidence of progressive volume overload.    Jonelle Sidle, M.D., F.A.C.C.

## 2012-12-07 NOTE — Assessment & Plan Note (Signed)
LVEF 40-45%, weight is down, no evidence of progressive volume overload.

## 2012-12-20 IMAGING — RF DG ESOPHAGUS
10 series · 10 of 10 positions shown · non-contrast
Comparison: None.

CLINICAL DATA: Solid food dysphagia.  Recent stroke.

ESOPHOGRAM / BARIUM SWALLOW / BARIUM TABLET STUDY
TECHNIQUE: Combined double contrast and single contrast
examination performed using effervescent crystals, thick barium
liquid, and thin barium liquid.  The patient was observed with
fluoroscopy swallowing a 13mm barium sulphate tablet.
Fluoroscopy time:  1.9 minutes.

[Series 1: run · 1 of 1 slices shown (1 of 10)]
[im 1/1]
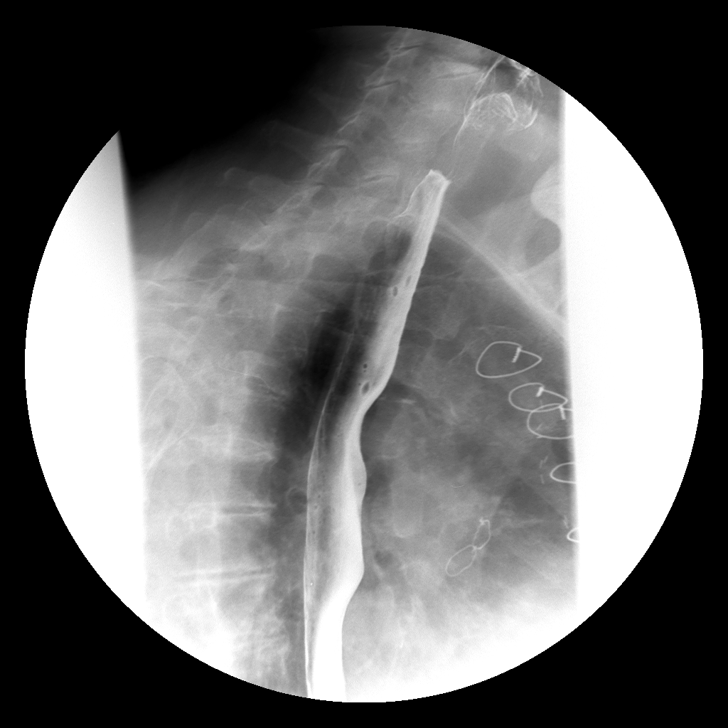

[Series 2: run · 1 of 1 slices shown (2 of 10)]
[im 1/1]
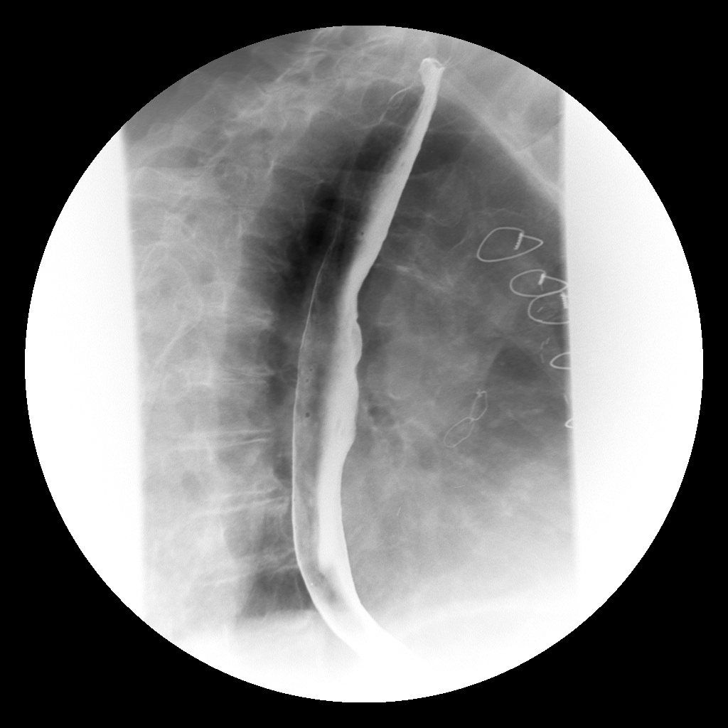

[Series 3: run · 1 of 1 slices shown (3 of 10)]
[im 1/1]
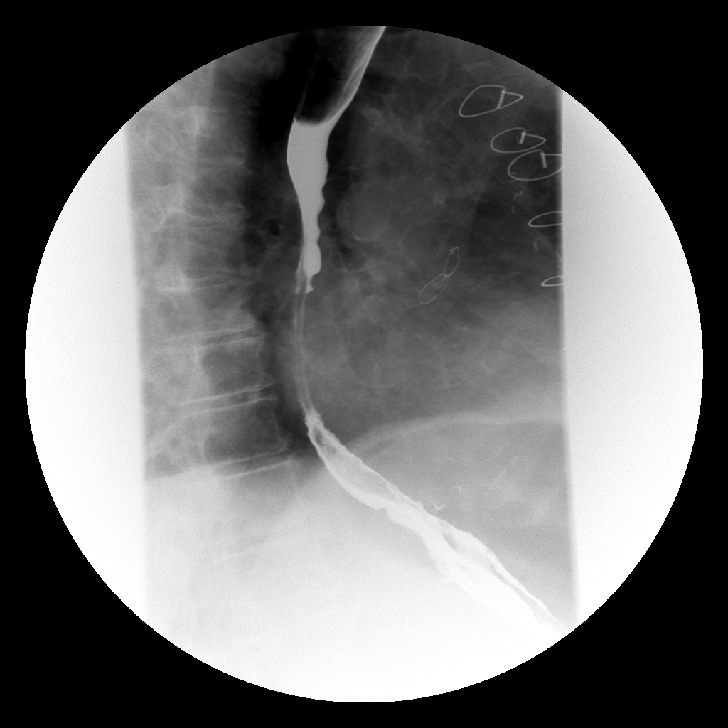

[Series 4: run · 1 of 1 slices shown (4 of 10)]
[im 1/1]
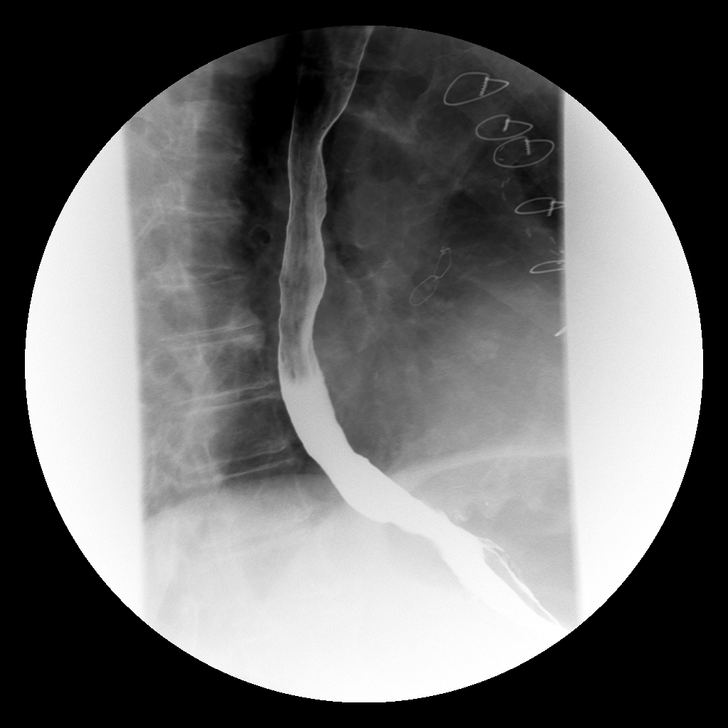

[Series 5: run · 1 of 1 slices shown (5 of 10)]
[im 1/1]
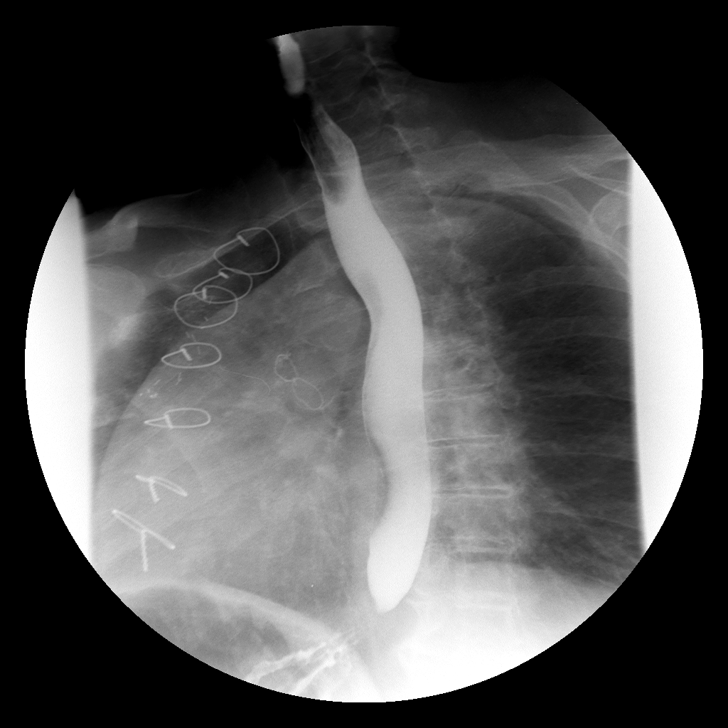

[Series 6: run · 1 of 1 slices shown (6 of 10)]
[im 1/1]
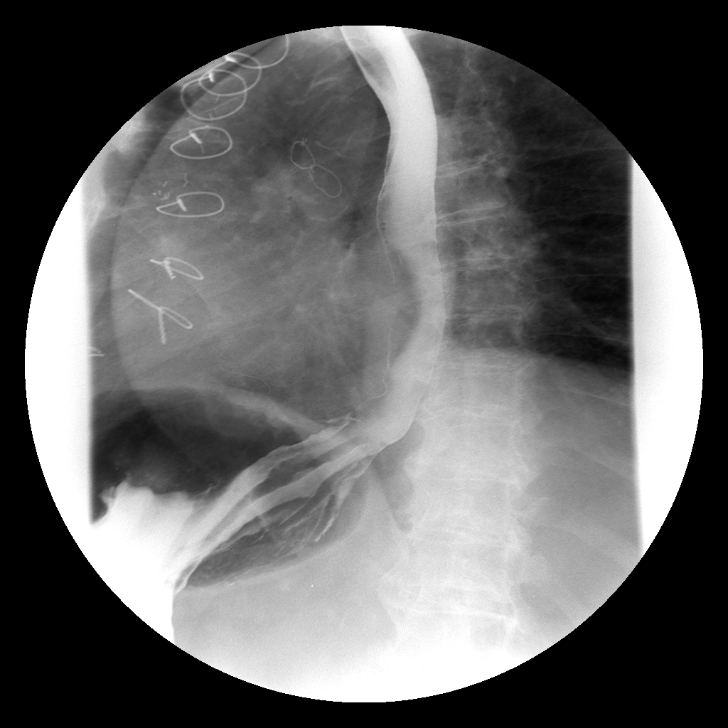

[Series 7: run · 1 of 1 slices shown (7 of 10)]
[im 1/1]
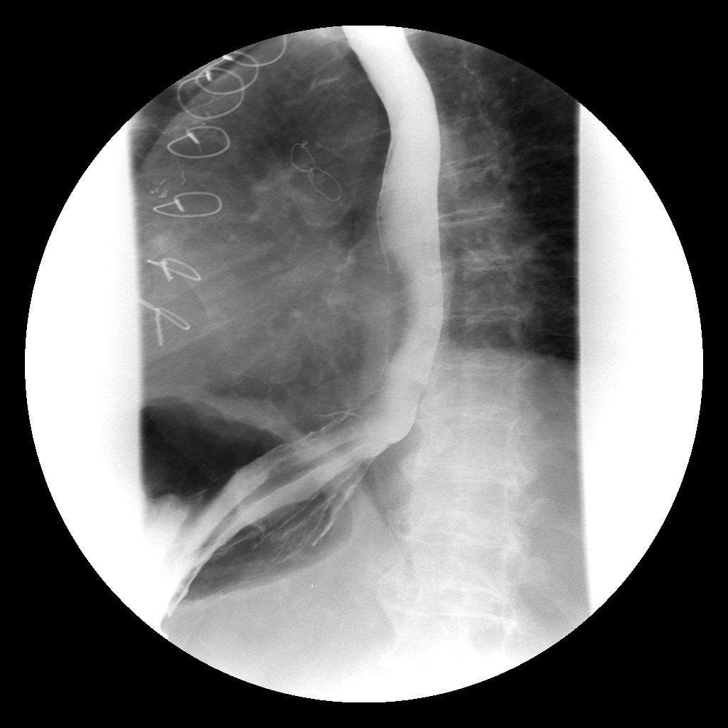

[Series 8: run · 1 of 1 slices shown (8 of 10)]
[im 1/1]
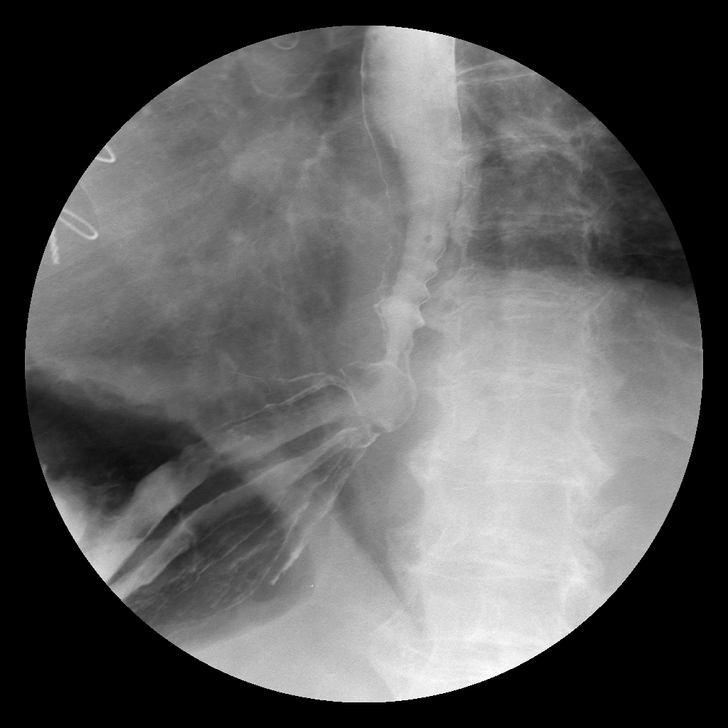

[Series 9: run · 1 of 1 slices shown (9 of 10)]
[im 1/1]
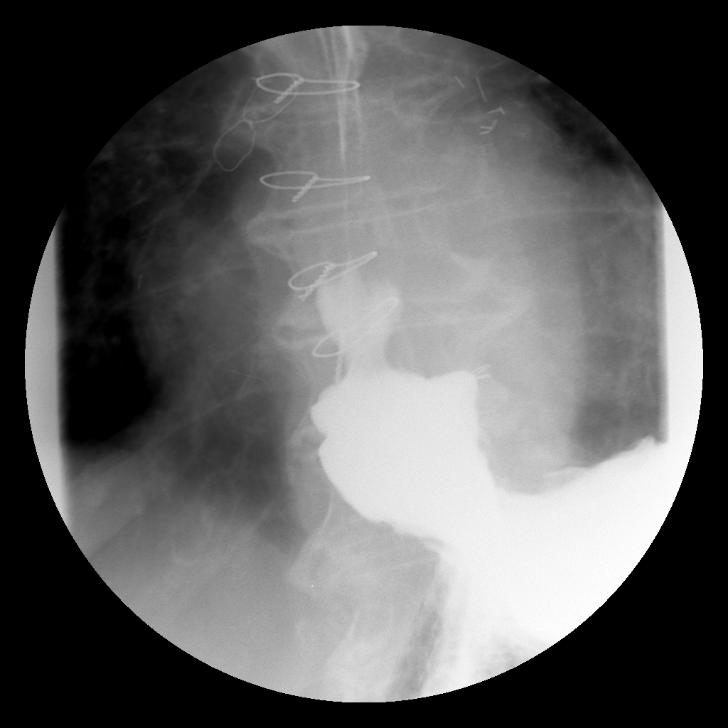

[Series 10: run · 1 of 1 slices shown (10 of 10)]
[im 1/1]
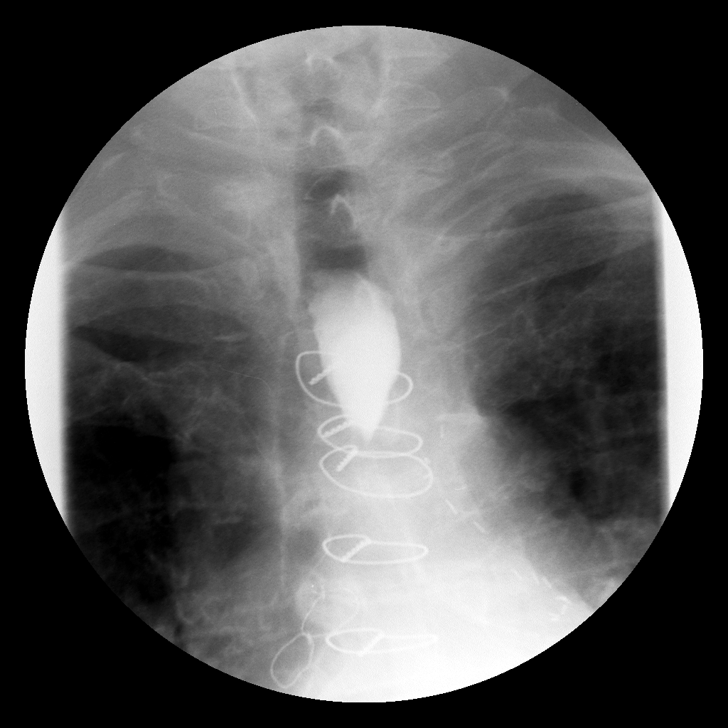

[10 of 10 positions shown; findings below may reference images not displayed]

FINDINGS: There is no evidence of esophageal mass or stricture.
No findings of esophagitis are seen.

A small sliding hiatal hernia is seen.  Gastroesophageal reflux of
contrast is seen to the level of the upper thoracic esophagus.
Moderate esophageal dysmotility is also noted, with breakup of
primary peristalsis in the upper thoracic esophagus.

A 13 mm barium tablet passes freely through the esophagus and into
the stomach.
IMPRESSION: 1.  Small sliding hiatal hernia and moderate gastroesophageal
reflux.
2.  Moderate esophageal dysmotility.
3.  No evidence of esophageal stricture or mass.

## 2013-01-14 ENCOUNTER — Telehealth: Payer: Self-pay | Admitting: Cardiology

## 2013-01-14 NOTE — Telephone Encounter (Signed)
Patient called [place] office and they transferred to [place].  She needs to be called back in reference to her starting insulin and her PCP told her to let Cardiologist know.     She is also concerned with her BP

## 2013-01-15 NOTE — Telephone Encounter (Signed)
Patient called to let cardiologist know she was starting insulin. Nurse informed her that it would be okay to start this.

## 2013-03-01 ENCOUNTER — Ambulatory Visit (INDEPENDENT_AMBULATORY_CARE_PROVIDER_SITE_OTHER): Payer: Medicare Other | Admitting: Ophthalmology

## 2013-03-15 ENCOUNTER — Ambulatory Visit (INDEPENDENT_AMBULATORY_CARE_PROVIDER_SITE_OTHER): Payer: Medicare Other | Admitting: Ophthalmology

## 2013-03-15 DIAGNOSIS — E11319 Type 2 diabetes mellitus with unspecified diabetic retinopathy without macular edema: Secondary | ICD-10-CM

## 2013-03-15 DIAGNOSIS — I10 Essential (primary) hypertension: Secondary | ICD-10-CM

## 2013-03-15 DIAGNOSIS — H35039 Hypertensive retinopathy, unspecified eye: Secondary | ICD-10-CM

## 2013-03-15 DIAGNOSIS — H43819 Vitreous degeneration, unspecified eye: Secondary | ICD-10-CM

## 2013-03-15 DIAGNOSIS — E1139 Type 2 diabetes mellitus with other diabetic ophthalmic complication: Secondary | ICD-10-CM

## 2013-03-15 DIAGNOSIS — E11359 Type 2 diabetes mellitus with proliferative diabetic retinopathy without macular edema: Secondary | ICD-10-CM

## 2013-06-07 ENCOUNTER — Encounter: Payer: Self-pay | Admitting: Cardiology

## 2013-06-07 ENCOUNTER — Ambulatory Visit (INDEPENDENT_AMBULATORY_CARE_PROVIDER_SITE_OTHER): Payer: Medicare Other | Admitting: Cardiology

## 2013-06-07 VITALS — BP 139/76 | HR 87 | Ht 62.0 in | Wt 158.0 lb

## 2013-06-07 DIAGNOSIS — I743 Embolism and thrombosis of arteries of the lower extremities: Secondary | ICD-10-CM

## 2013-06-07 DIAGNOSIS — I2510 Atherosclerotic heart disease of native coronary artery without angina pectoris: Secondary | ICD-10-CM

## 2013-06-07 DIAGNOSIS — I779 Disorder of arteries and arterioles, unspecified: Secondary | ICD-10-CM | POA: Insufficient documentation

## 2013-06-07 DIAGNOSIS — E785 Hyperlipidemia, unspecified: Secondary | ICD-10-CM

## 2013-06-07 NOTE — Progress Notes (Signed)
Clinical Summary Ms. Cleland is an 79 y.o.female last seen in December 2013. She is here for a regular visit. Fortunately, no angina symptoms, stable dyspnea on exertion. She has been having problems with poorly healing wounds, specifically her right foot, followed in the Wound Center at [facility]. She had arterial studies done earlier today. Report indicates ABI 0.62 on the right and 0.83 on the left. She has a referral pending to [facility].  Weight is up 5 pounds from the last visit. No orthopnea or PND. Medications are noted below. She did have full lab work done by her primary care provider just recently, results to be requested.  We continue medical therapy for multivessel CAD status post CABG with known occlusion of the SVG to OM system following NSTEMI in April of last year  Allergies  Allergen Reactions  . Ace Inhibitors     Hyperkalemia  . Cephalexin     Reaction unknown-but on pt's list of medications that she was told she was very allergic to.  . Furosemide Other (See Comments)    presyncope  . Levofloxacin Swelling  . Bactrim Rash  . Ciprocin-Fluocin-Procin (Fluocinolone Acetonide) Rash  . Ciprofloxacin Rash  . Codeine Rash  . Morphine And Related Rash  . Pentazocine Rash  . Promethazine Swelling and Rash  . Sulfa Antibiotics Swelling and Rash  . Trimethoprim Swelling and Rash    Current Outpatient Prescriptions  Medication Sig Dispense Refill  . allopurinol (ZYLOPRIM) 300 MG tablet Take 300 mg by mouth daily.       Marland Kitchen amoxicillin (AMOXIL) 500 MG capsule Take 2,000 mg by mouth as directed. Take 4 capsules 1 hour prior to dental procedure.      Marland Kitchen aspirin EC 81 MG tablet Take 81 mg by mouth daily.      Marland Kitchen atorvastatin (LIPITOR) 80 MG tablet Take 80 mg by mouth daily.      . Biotin 1000 MCG tablet Take 1,000 mcg by mouth daily.       . carvedilol (COREG) 6.25 MG tablet Take 6.25 mg by mouth 2 (two) times daily with a meal.      . cholecalciferol (VITAMIN D) 1000 UNITS tablet  Take 1,000 Units by mouth daily.      . clopidogrel (PLAVIX) 75 MG tablet Take 1 tablet by mouth daily.      . diphenhydramine-acetaminophen (TYLENOL PM) 25-500 MG TABS Take 1 tablet by mouth at bedtime as needed. For sleep      . ferrous sulfate 325 (65 FE) MG tablet Take 325 mg by mouth 3 (three) times daily with meals.      Marland Kitchen glyBURIDE (DIABETA) 5 MG tablet Take 10 mg by mouth 2 (two) times daily with a meal.       . Glycerin-Hypromellose-PEG 400 (VISINE TEARS OP) Apply to eye as directed.      . isosorbide mononitrate (IMDUR) 30 MG 24 hr tablet Take 30 mg by mouth daily.      Marland Kitchen lamoTRIgine (LAMICTAL) 100 MG tablet Take 100 mg by mouth 2 (two) times daily.       Marland Kitchen LANTUS SOLOSTAR 100 UNIT/ML SOPN Inject 18 Units into the skin daily.      . meclizine (ANTIVERT) 12.5 MG tablet Take 12.5 mg by mouth 3 (three) times daily as needed.       . nitroGLYCERIN (NITROSTAT) 0.4 MG SL tablet Place 0.4 mg under the tongue every 5 (five) minutes x 3 doses as needed for chest pain (up to 3  doses).      Marland Kitchen omeprazole (PRILOSEC) 20 MG capsule Take 20 mg by mouth 2 (two) times daily.       Marland Kitchen torsemide (DEMADEX) 20 MG tablet Take 20 mg by mouth Daily.        No current facility-administered medications for this visit.    Past Medical History  Diagnosis Date  . Essential hypertension, benign   . Gout   . GERD (gastroesophageal reflux disease)   . Systolic and diastolic CHF, chronic     Echo 04/05/12 EF 40-45%, anterolateral/posterior hypokinesis, grade 1 diastolic dysfunction, mild MR, mildly dilated LA  . Hyperlipidemia   . Coronary atherosclerosis of native coronary artery     s/p 4V CABG '97; NSTEMI -->Cardiac cath 04/03/12 revealed a new occlusion of SVG to OM system, Medical therapy  . Diabetes mellitus, type 2   . Seizures   . Arthritis   . Osteoporosis   . Retinal hemorrhage, both eyes   . Stroke     1980 and then 2 strokes 2012    Social History Ms. Sandstrom reports that she has never smoked.  She has never used smokeless tobacco. Ms. Mumpower reports that she does not drink alcohol.  Review of Systems No palpitations, no falls. Reports no bleeding episodes on DAPT. Otherwise as outlined.  Physical Examination Filed Vitals:   06/07/13 1359  BP: 139/76  Pulse: 87   Filed Weights   06/07/13 1359  Weight: 158 lb (71.668 kg)    Elderly woman, comfortable at rest.  HEENT: Conjunctiva and lids normal, oropharynx clear.  Neck: Supple, no elevated JVP or carotid bruits, no thyromegaly.  Lungs: Clear to auscultation, diminished, nonlabored breathing at rest.  Cardiac: Regular rate and rhythm, no S3, 2/6 systolic murmur, no pericardial rub.  Abdomen: Soft, nontender, bowel sounds present, no guarding or rebound.  Extremities: 1+ edema, distal pulses diminished but present. Right foot dressed. Skin: Warm and dry. Musculoskeletal: No kyphosis. Neuropsychiatric: Alert and oriented x3, affect appropriate   Problem List and Plan   Coronary atherosclerosis of native coronary artery Symptomatically stable on medical therapy. ECG reviewed with stable right bundle branch block. Plan to continue medical therapy and observation.  Cardiomyopathy secondary LVEF 40-45%. Weight is up 5 pounds in 6 months, but no marked evidence of volume overload. Continue current regimen.  Hyperlipidemia Will request most recent lab work from Dr. Sherril Croon.  Peripheral arterial occlusive disease Right foot wound, followed at [facility]. Recent ABIs noted above. She has pending referral to [facility].    Jonelle Sidle, M.D., F.A.C.C.

## 2013-06-07 NOTE — Assessment & Plan Note (Signed)
LVEF 40-45%. Weight is up 5 pounds in 6 months, but no marked evidence of volume overload. Continue current regimen.

## 2013-06-07 NOTE — Patient Instructions (Addendum)

## 2013-06-07 NOTE — Assessment & Plan Note (Signed)
Symptomatically stable on medical therapy. ECG reviewed with stable right bundle branch block. Plan to continue medical therapy and observation.

## 2013-06-07 NOTE — Assessment & Plan Note (Signed)
Will request most recent lab work from Dr. Sherril Croon.

## 2013-06-07 NOTE — Assessment & Plan Note (Signed)
Right foot wound, followed at [facility]. Recent ABIs noted above. She has pending referral to [facility].

## 2013-06-11 ENCOUNTER — Other Ambulatory Visit: Payer: Self-pay | Admitting: *Deleted

## 2013-06-11 DIAGNOSIS — L97909 Non-pressure chronic ulcer of unspecified part of unspecified lower leg with unspecified severity: Secondary | ICD-10-CM

## 2013-06-11 DIAGNOSIS — I739 Peripheral vascular disease, unspecified: Secondary | ICD-10-CM

## 2013-06-27 ENCOUNTER — Encounter: Payer: Self-pay | Admitting: Surgery

## 2013-07-01 ENCOUNTER — Encounter (INDEPENDENT_AMBULATORY_CARE_PROVIDER_SITE_OTHER): Payer: Medicare Other | Admitting: *Deleted

## 2013-07-01 ENCOUNTER — Ambulatory Visit (INDEPENDENT_AMBULATORY_CARE_PROVIDER_SITE_OTHER): Payer: Medicare Other | Admitting: Surgery

## 2013-07-01 ENCOUNTER — Encounter: Payer: Self-pay | Admitting: Surgery

## 2013-07-01 VITALS — BP 158/83 | HR 75 | Resp 16 | Ht 62.0 in | Wt 158.0 lb

## 2013-07-01 DIAGNOSIS — L97909 Non-pressure chronic ulcer of unspecified part of unspecified lower leg with unspecified severity: Secondary | ICD-10-CM

## 2013-07-01 DIAGNOSIS — M79609 Pain in unspecified limb: Secondary | ICD-10-CM

## 2013-07-01 DIAGNOSIS — I739 Peripheral vascular disease, unspecified: Secondary | ICD-10-CM

## 2013-07-01 NOTE — Progress Notes (Signed)
[facility]   Patient name: [name] MRN: [id number] DOB: 04-12-1931 Sex: female   Referred by: Dr. Marcha Solders  Reason for referral:  Chief Complaint  Patient presents with  . New Evaluation    Non-healing ulcer  C/O Right lateral foot painful with walking and at rest, duation3 mo. Pt. had vascular lab study today.    HISTORY OF PRESENT ILLNESS: The patient is referred for evaluation of a right leg ulcer. The patient states that it has been present for approximately 3 months. She is being seen at the wound center for serial debridements and dressing changes. This does cause her significant pain. She has a history of a right great toe amputation in 2002 which has healed.  The patient is a brittle diabetic and has had trouble regulating her blood sugars. She suffers from hypercholesterolemia and is treated with a statin. She also has coronary artery disease. She is status post CABG. She had an MI last April which she was told needed to be treated medically. She has never smoked.  Past Medical History  Diagnosis Date  . Essential hypertension, benign   . Gout   . GERD (gastroesophageal reflux disease)   . Systolic and diastolic CHF, chronic     Echo 04/05/12 EF 40-45%, anterolateral/posterior hypokinesis, grade 1 diastolic dysfunction, mild MR, mildly dilated LA  . Hyperlipidemia   . Coronary atherosclerosis of native coronary artery     s/p 4V CABG '97; NSTEMI -->Cardiac cath 04/03/12 revealed a new occlusion of SVG to OM system, Medical therapy  . Diabetes mellitus, type 2   . Seizures   . Arthritis   . Osteoporosis   . Retinal hemorrhage, both eyes   . Stroke     1980 and then 2 strokes 2012    Past Surgical History  Procedure Laterality Date  . Coronary artery bypass graft      1997  . Knee surgery      bilateral  . Abdominal hysterectomy    . Cardiac catheterization      11/2010-patent grafts  . Right foot surgery      big toe removed  and other surgeries on rt foot because of infection  . Left hip hemi-arthroplasty  07-31-2011    SURGERY AT [facility] FOR FRACTURE LEFT FEMORAL NECK  . Total hip revision  01/23/2012    Procedure: TOTAL HIP REVISION;  Surgeon: Shelda Pal, MD;  Location: [facility];  Service: Orthopedics;  Laterality: Left;  Conversion of  Previous Surgery to a Left Total Hip  . Abdominal hysterectomy    . Joint replacement  Jan. 2013    Left Hip    History   Social History  . Marital Status: Widowed    Spouse Name: N/A    Number of Children: N/A  . Years of Education: N/A   Occupational History  . Not on file.   Social History Main Topics  . Smoking status: Never Smoker   . Smokeless tobacco: Never Used  . Alcohol Use: No  . Drug Use: No  . Sexually Active: Not Currently    Birth Control/ Protection: Post-menopausal   Other Topics Concern  . Not on file   Social History Narrative  . No narrative on file    Family History  Problem Relation Age of Onset  . Heart attack Father 13  . Heart disease Father     Allergies as of 07/01/2013 - Review Complete 07/01/2013  Allergen Reaction Noted  .  Ace inhibitors  10/20/2011  . Cephalexin  01/18/2012  . Furosemide Other (See Comments) 04/13/2011  . Levofloxacin Swelling 10/20/2011  . Bactrim Rash 04/13/2011  . Ciprocin-fluocin-procin (fluocinolone acetonide) Rash 04/13/2011  . Ciprofloxacin Rash 01/11/2012  . Codeine Rash 04/13/2011  . Morphine and related Rash 04/13/2011  . Pentazocine Rash 04/13/2011  . Promethazine Swelling and Rash 01/11/2012  . Sulfa antibiotics Swelling and Rash 01/11/2012  . Trimethoprim Swelling and Rash 01/11/2012    Current Outpatient Prescriptions on File Prior to Visit  Medication Sig Dispense Refill  . allopurinol (ZYLOPRIM) 300 MG tablet Take 300 mg by mouth daily.       Marland Kitchen amoxicillin (AMOXIL) 500 MG capsule Take 2,000 mg by mouth as directed. Take 4 capsules 1 hour prior to dental procedure.      Marland Kitchen aspirin EC  81 MG tablet Take 81 mg by mouth daily.      Marland Kitchen atorvastatin (LIPITOR) 80 MG tablet Take 80 mg by mouth daily.      . Biotin 1000 MCG tablet Take 1,000 mcg by mouth daily.       . carvedilol (COREG) 6.25 MG tablet Take 6.25 mg by mouth 2 (two) times daily with a meal.      . cholecalciferol (VITAMIN D) 1000 UNITS tablet Take 1,000 Units by mouth daily.      . clopidogrel (PLAVIX) 75 MG tablet Take 1 tablet by mouth daily.      . diphenhydramine-acetaminophen (TYLENOL PM) 25-500 MG TABS Take 1 tablet by mouth at bedtime as needed. For sleep      . ferrous sulfate 325 (65 FE) MG tablet Take 325 mg by mouth 3 (three) times daily with meals.      Marland Kitchen glyBURIDE (DIABETA) 5 MG tablet Take 10 mg by mouth 2 (two) times daily with a meal.       . Glycerin-Hypromellose-PEG 400 (VISINE TEARS OP) Apply to eye as directed.      . isosorbide mononitrate (IMDUR) 30 MG 24 hr tablet Take 30 mg by mouth daily.      Marland Kitchen lamoTRIgine (LAMICTAL) 100 MG tablet Take 100 mg by mouth 2 (two) times daily.       Marland Kitchen LANTUS SOLOSTAR 100 UNIT/ML SOPN Inject 18 Units into the skin daily.      . meclizine (ANTIVERT) 12.5 MG tablet Take 12.5 mg by mouth 3 (three) times daily as needed.       . nitroGLYCERIN (NITROSTAT) 0.4 MG SL tablet Place 0.4 mg under the tongue every 5 (five) minutes x 3 doses as needed for chest pain (up to 3 doses).      Marland Kitchen omeprazole (PRILOSEC) 20 MG capsule Take 20 mg by mouth 2 (two) times daily.       Marland Kitchen torsemide (DEMADEX) 20 MG tablet Take 20 mg by mouth Daily.        No current facility-administered medications on file prior to visit.     REVIEW OF SYSTEMS: Cardiovascular: No chest pain, chest pressure, palpitations, orthopnea, or dyspnea on exertion. Positive for pain in her feet when lying flat Pulmonary: No productive cough, asthma or wheezing. Neurologic: No weakness, paresthesias, aphasia, or amaurosis. No dizziness. Hematologic: No bleeding problems or clotting disorders. Musculoskeletal: Right  great toe dictation Gastrointestinal: No blood in stool or hematemesis Genitourinary: No dysuria or hematuria. Psychiatric:: No history of major depression. Integumentary: No rashes or ulcers. Constitutional: No fever or chills.  PHYSICAL EXAMINATION: General: The patient appears their stated age.  Vital signs are  BP 158/83  Pulse 75  Resp 16  Ht 5\' 2"  (1.575 m)  Wt 158 lb (71.668 kg)  BMI 28.89 kg/m2  SpO2 98% HEENT:  No gross abnormalities Pulmonary: Respirations are non-labored Abdomen: Soft and non-tender  Musculoskeletal: Right great toe is surgically absent Neurologic: No focal weakness or paresthesias are detected, Skin: 2 cm ulcer at the base of the right fifth toe which tracks down to the bone. There is surrounding erythema. Psychiatric: The patient has normal affect. Cardiovascular: There is a regular rate and rhythm without significant murmur appreciated. Pedal pulses are nonpalpable.  Diagnostic Studies: Outside ankle-brachial indices were 0.6 on the right and 0.8 on the left. Duplex ultrasound was reviewed in our office today. This is a high-grade mid superficial femoral artery stenosis.  Outside Studies/Documentation Historical records were reviewed.  They showed nonhealing right toe ulcer in the setting of peripheral vascular disease  Medication Changes: None  Assessment:  Nonhealing right fifth toe ulcer Plan: I discussed with the patient that this is a limb threatening situation. She needs to undergo further arterial evaluation via angiography. We discussed the details of the procedure. If she is a candidate for percutaneous intervention L. proceed as indicated. I will plan on accessing her left groin, studying both legs and treating the right if possible. I'm giving her doxycycline today because I think she does have a component of cellulitis in the wound. I would like for this to resolve somewhat prior to proceeding with her angiography which I have scheduled  for Tuesday, July 15th     Jorge Ny, M.D. [facility] [place] Office: [phone] Pager:  [phone]

## 2013-07-02 ENCOUNTER — Encounter (HOSPITAL_COMMUNITY): Payer: Self-pay | Admitting: Pharmacy Technician

## 2013-07-03 ENCOUNTER — Inpatient Hospital Stay (HOSPITAL_COMMUNITY)
Admission: EM | Admit: 2013-07-03 | Discharge: 2013-07-12 | DRG: 628 | Disposition: A | Discharge status: Skilled Nursing Facility | Payer: Medicare Other | Attending: Internal Medicine | Admitting: Internal Medicine

## 2013-07-03 ENCOUNTER — Encounter (HOSPITAL_COMMUNITY): Payer: Self-pay | Admitting: Emergency Medicine

## 2013-07-03 ENCOUNTER — Emergency Department (HOSPITAL_COMMUNITY): Payer: Medicare Other

## 2013-07-03 DIAGNOSIS — T368X5A Adverse effect of other systemic antibiotics, initial encounter: Secondary | ICD-10-CM | POA: Diagnosis not present

## 2013-07-03 DIAGNOSIS — E7800 Pure hypercholesterolemia, unspecified: Secondary | ICD-10-CM | POA: Diagnosis present

## 2013-07-03 DIAGNOSIS — I7779 Dissection of other artery: Secondary | ICD-10-CM | POA: Diagnosis present

## 2013-07-03 DIAGNOSIS — I25810 Atherosclerosis of coronary artery bypass graft(s) without angina pectoris: Secondary | ICD-10-CM | POA: Diagnosis present

## 2013-07-03 DIAGNOSIS — Z96649 Presence of unspecified artificial hip joint: Secondary | ICD-10-CM

## 2013-07-03 DIAGNOSIS — N179 Acute kidney failure, unspecified: Secondary | ICD-10-CM | POA: Diagnosis not present

## 2013-07-03 DIAGNOSIS — M9080 Osteopathy in diseases classified elsewhere, unspecified site: Secondary | ICD-10-CM | POA: Diagnosis present

## 2013-07-03 DIAGNOSIS — I743 Embolism and thrombosis of arteries of the lower extremities: Secondary | ICD-10-CM | POA: Diagnosis present

## 2013-07-03 DIAGNOSIS — I129 Hypertensive chronic kidney disease with stage 1 through stage 4 chronic kidney disease, or unspecified chronic kidney disease: Secondary | ICD-10-CM | POA: Diagnosis present

## 2013-07-03 DIAGNOSIS — I5042 Chronic combined systolic (congestive) and diastolic (congestive) heart failure: Secondary | ICD-10-CM | POA: Diagnosis present

## 2013-07-03 DIAGNOSIS — I2589 Other forms of chronic ischemic heart disease: Secondary | ICD-10-CM | POA: Diagnosis present

## 2013-07-03 DIAGNOSIS — I509 Heart failure, unspecified: Secondary | ICD-10-CM | POA: Diagnosis present

## 2013-07-03 DIAGNOSIS — E119 Type 2 diabetes mellitus without complications: Secondary | ICD-10-CM | POA: Diagnosis present

## 2013-07-03 DIAGNOSIS — R569 Unspecified convulsions: Secondary | ICD-10-CM | POA: Diagnosis present

## 2013-07-03 DIAGNOSIS — N289 Disorder of kidney and ureter, unspecified: Secondary | ICD-10-CM | POA: Diagnosis present

## 2013-07-03 DIAGNOSIS — L03115 Cellulitis of right lower limb: Secondary | ICD-10-CM

## 2013-07-03 DIAGNOSIS — I252 Old myocardial infarction: Secondary | ICD-10-CM

## 2013-07-03 DIAGNOSIS — Z8673 Personal history of transient ischemic attack (TIA), and cerebral infarction without residual deficits: Secondary | ICD-10-CM

## 2013-07-03 DIAGNOSIS — M869 Osteomyelitis, unspecified: Secondary | ICD-10-CM | POA: Diagnosis present

## 2013-07-03 DIAGNOSIS — L97509 Non-pressure chronic ulcer of other part of unspecified foot with unspecified severity: Secondary | ICD-10-CM | POA: Diagnosis present

## 2013-07-03 DIAGNOSIS — L03119 Cellulitis of unspecified part of limb: Secondary | ICD-10-CM

## 2013-07-03 DIAGNOSIS — M810 Age-related osteoporosis without current pathological fracture: Secondary | ICD-10-CM | POA: Diagnosis present

## 2013-07-03 DIAGNOSIS — E1169 Type 2 diabetes mellitus with other specified complication: Principal | ICD-10-CM | POA: Diagnosis present

## 2013-07-03 DIAGNOSIS — Z794 Long term (current) use of insulin: Secondary | ICD-10-CM

## 2013-07-03 DIAGNOSIS — N182 Chronic kidney disease, stage 2 (mild): Secondary | ICD-10-CM | POA: Diagnosis present

## 2013-07-03 DIAGNOSIS — E11621 Type 2 diabetes mellitus with foot ulcer: Secondary | ICD-10-CM

## 2013-07-03 DIAGNOSIS — E785 Hyperlipidemia, unspecified: Secondary | ICD-10-CM | POA: Diagnosis present

## 2013-07-03 DIAGNOSIS — L02619 Cutaneous abscess of unspecified foot: Secondary | ICD-10-CM | POA: Diagnosis present

## 2013-07-03 DIAGNOSIS — I70209 Unspecified atherosclerosis of native arteries of extremities, unspecified extremity: Secondary | ICD-10-CM | POA: Diagnosis present

## 2013-07-03 DIAGNOSIS — I779 Disorder of arteries and arterioles, unspecified: Secondary | ICD-10-CM

## 2013-07-03 DIAGNOSIS — T50995A Adverse effect of other drugs, medicaments and biological substances, initial encounter: Secondary | ICD-10-CM | POA: Diagnosis not present

## 2013-07-03 DIAGNOSIS — M129 Arthropathy, unspecified: Secondary | ICD-10-CM | POA: Diagnosis present

## 2013-07-03 DIAGNOSIS — S98139A Complete traumatic amputation of one unspecified lesser toe, initial encounter: Secondary | ICD-10-CM

## 2013-07-03 DIAGNOSIS — K219 Gastro-esophageal reflux disease without esophagitis: Secondary | ICD-10-CM | POA: Diagnosis present

## 2013-07-03 DIAGNOSIS — I429 Cardiomyopathy, unspecified: Secondary | ICD-10-CM

## 2013-07-03 DIAGNOSIS — I10 Essential (primary) hypertension: Secondary | ICD-10-CM | POA: Diagnosis present

## 2013-07-03 DIAGNOSIS — M109 Gout, unspecified: Secondary | ICD-10-CM | POA: Diagnosis present

## 2013-07-03 DIAGNOSIS — I2510 Atherosclerotic heart disease of native coronary artery without angina pectoris: Secondary | ICD-10-CM | POA: Diagnosis present

## 2013-07-03 DIAGNOSIS — Z7982 Long term (current) use of aspirin: Secondary | ICD-10-CM

## 2013-07-03 HISTORY — DX: Ischemic cardiomyopathy: I25.5

## 2013-07-03 NOTE — ED Notes (Signed)
PT. REPORTS PROGRESSING RIGHT LATERAL 5TH TOE INFECTION WIT PAIN / SWELLING / DRAINAGE , CURRENTLY TAKING ORAL ANTIBIOTIC PRESCRIBED BY WOUND CARE CENTER WITH NO IMPROVEMENT. DENIES FEVER OR CHILLS.

## 2013-07-03 NOTE — ED Notes (Signed)
Patient called x3 with no answer. 

## 2013-07-04 ENCOUNTER — Encounter (HOSPITAL_COMMUNITY): Payer: Self-pay | Admitting: Internal Medicine

## 2013-07-04 ENCOUNTER — Other Ambulatory Visit: Payer: Self-pay

## 2013-07-04 DIAGNOSIS — I2510 Atherosclerotic heart disease of native coronary artery without angina pectoris: Secondary | ICD-10-CM

## 2013-07-04 DIAGNOSIS — L03119 Cellulitis of unspecified part of limb: Secondary | ICD-10-CM

## 2013-07-04 DIAGNOSIS — I743 Embolism and thrombosis of arteries of the lower extremities: Secondary | ICD-10-CM

## 2013-07-04 DIAGNOSIS — E119 Type 2 diabetes mellitus without complications: Secondary | ICD-10-CM | POA: Diagnosis present

## 2013-07-04 DIAGNOSIS — L02619 Cutaneous abscess of unspecified foot: Secondary | ICD-10-CM

## 2013-07-04 DIAGNOSIS — I10 Essential (primary) hypertension: Secondary | ICD-10-CM

## 2013-07-04 LAB — CBC WITH DIFFERENTIAL/PLATELET
Eosinophils Absolute: 0.2 10*3/uL (ref 0.0–0.7)
Hemoglobin: 13.2 g/dL (ref 12.0–15.0)
Lymphocytes Relative: 29 % (ref 12–46)
Lymphs Abs: 2.9 10*3/uL (ref 0.7–4.0)
Monocytes Relative: 12 % (ref 3–12)
Neutro Abs: 5.6 10*3/uL (ref 1.7–7.7)
Neutrophils Relative %: 56 % (ref 43–77)
Platelets: 240 10*3/uL (ref 150–400)
RBC: 4.21 MIL/uL (ref 3.87–5.11)
WBC: 9.9 10*3/uL (ref 4.0–10.5)

## 2013-07-04 LAB — BASIC METABOLIC PANEL
CO2: 27 mEq/L (ref 19–32)
Chloride: 94 mEq/L — ABNORMAL LOW (ref 96–112)
GFR calc non Af Amer: 41 mL/min — ABNORMAL LOW (ref >90)
Glucose, Bld: 237 mg/dL — ABNORMAL HIGH (ref 70–99)
Potassium: 4.1 mEq/L (ref 3.5–5.1)
Sodium: 132 mEq/L — ABNORMAL LOW (ref 135–145)

## 2013-07-04 LAB — GLUCOSE, CAPILLARY
Glucose-Capillary: 164 mg/dL — ABNORMAL HIGH (ref 70–99)
Glucose-Capillary: 173 mg/dL — ABNORMAL HIGH (ref 70–99)
Glucose-Capillary: 267 mg/dL — ABNORMAL HIGH (ref 70–99)

## 2013-07-04 MED ORDER — INSULIN GLARGINE 100 UNIT/ML ~~LOC~~ SOLN
18.0000 [IU] | Freq: Every day | SUBCUTANEOUS | Status: DC
  Administered 2013-07-04: 18 [IU] via SUBCUTANEOUS
  Filled 2013-07-04 (×2): qty 0.18

## 2013-07-04 MED ORDER — POLYVINYL ALCOHOL 1.4 % OP SOLN: 1.0000 [drp] | Freq: Four times a day (QID) | OPHTHALMIC | Status: DC | PRN

## 2013-07-04 MED ORDER — CLOPIDOGREL BISULFATE 75 MG PO TABS
75.0000 mg | ORAL_TABLET | Freq: Every morning | ORAL | Status: DC
  Administered 2013-07-04 – 2013-07-05 (×2): 75 mg via ORAL
  Filled 2013-07-04 (×2): qty 1

## 2013-07-04 MED ORDER — ASPIRIN EC 81 MG PO TBEC
81.0000 mg | DELAYED_RELEASE_TABLET | Freq: Every day | ORAL | Status: DC
  Administered 2013-07-04 – 2013-07-11 (×8): 81 mg via ORAL
  Filled 2013-07-04 (×10): qty 1

## 2013-07-04 MED ORDER — VANCOMYCIN HCL 10 G IV SOLR: 1250.0000 mg | INTRAVENOUS | Status: DC

## 2013-07-04 MED ORDER — GLYCERIN-HYPROMELLOSE-PEG 400 0.2-0.2-1 % OP SOLN: 1.0000 [drp] | Freq: Four times a day (QID) | OPHTHALMIC | Status: DC | PRN

## 2013-07-04 MED ORDER — TORSEMIDE 20 MG PO TABS
20.0000 mg | ORAL_TABLET | Freq: Every morning | ORAL | Status: DC
  Administered 2013-07-04 – 2013-07-06 (×3): 20 mg via ORAL
  Filled 2013-07-04 (×3): qty 1

## 2013-07-04 MED ORDER — ACETAMINOPHEN 500 MG PO TABS
500.0000 mg | ORAL_TABLET | Freq: Every day | ORAL | Status: DC
  Administered 2013-07-04 – 2013-07-11 (×8): 500 mg via ORAL
  Filled 2013-07-04 (×9): qty 1

## 2013-07-04 MED ORDER — CLINDAMYCIN PHOSPHATE 600 MG/50ML IV SOLN
600.0000 mg | Freq: Three times a day (TID) | INTRAVENOUS | Status: DC
  Administered 2013-07-04 – 2013-07-08 (×14): 600 mg via INTRAVENOUS
  Filled 2013-07-04 (×19): qty 50

## 2013-07-04 MED ORDER — VITAMIN D3 25 MCG (1000 UNIT) PO TABS
1000.0000 [IU] | ORAL_TABLET | Freq: Every morning | ORAL | Status: DC
  Administered 2013-07-04 – 2013-07-12 (×9): 1000 [IU] via ORAL
  Filled 2013-07-04 (×10): qty 1

## 2013-07-04 MED ORDER — INSULIN ASPART 100 UNIT/ML ~~LOC~~ SOLN
0.0000 [IU] | Freq: Three times a day (TID) | SUBCUTANEOUS | Status: DC
  Administered 2013-07-04: 5 [IU] via SUBCUTANEOUS
  Administered 2013-07-04 – 2013-07-05 (×2): 8 [IU] via SUBCUTANEOUS
  Administered 2013-07-05: 3 [IU] via SUBCUTANEOUS
  Administered 2013-07-06: 15 [IU] via SUBCUTANEOUS
  Administered 2013-07-06: 5 [IU] via SUBCUTANEOUS
  Administered 2013-07-06: 2 [IU] via SUBCUTANEOUS
  Administered 2013-07-07 – 2013-07-08 (×3): 3 [IU] via SUBCUTANEOUS
  Administered 2013-07-10: 2 [IU] via SUBCUTANEOUS
  Administered 2013-07-10: 5 [IU] via SUBCUTANEOUS
  Administered 2013-07-11: 8 [IU] via SUBCUTANEOUS
  Administered 2013-07-11 – 2013-07-12 (×2): 5 [IU] via SUBCUTANEOUS

## 2013-07-04 MED ORDER — ATORVASTATIN CALCIUM 80 MG PO TABS
80.0000 mg | ORAL_TABLET | Freq: Every day | ORAL | Status: DC
  Administered 2013-07-04 – 2013-07-11 (×8): 80 mg via ORAL
  Filled 2013-07-04 (×9): qty 1

## 2013-07-04 MED ORDER — ISOSORBIDE MONONITRATE ER 30 MG PO TB24
30.0000 mg | ORAL_TABLET | Freq: Every morning | ORAL | Status: DC
  Administered 2013-07-04 – 2013-07-12 (×9): 30 mg via ORAL
  Filled 2013-07-04 (×11): qty 1

## 2013-07-04 MED ORDER — DEXTROSE 5 % IV SOLN
1.0000 g | Freq: Three times a day (TID) | INTRAVENOUS | Status: DC
  Administered 2013-07-04 – 2013-07-12 (×26): 1 g via INTRAVENOUS
  Filled 2013-07-04 (×29): qty 1

## 2013-07-04 MED ORDER — GLYBURIDE 5 MG PO TABS
10.0000 mg | ORAL_TABLET | Freq: Two times a day (BID) | ORAL | Status: DC
  Administered 2013-07-04 – 2013-07-12 (×17): 10 mg via ORAL
  Filled 2013-07-04 (×19): qty 2

## 2013-07-04 MED ORDER — CARVEDILOL 6.25 MG PO TABS
6.2500 mg | ORAL_TABLET | Freq: Two times a day (BID) | ORAL | Status: DC
  Administered 2013-07-04 – 2013-07-11 (×14): 6.25 mg via ORAL
  Filled 2013-07-04 (×17): qty 1

## 2013-07-04 MED ORDER — PANTOPRAZOLE SODIUM 40 MG PO TBEC
40.0000 mg | DELAYED_RELEASE_TABLET | Freq: Every day | ORAL | Status: DC
  Administered 2013-07-04 – 2013-07-12 (×9): 40 mg via ORAL
  Filled 2013-07-04 (×9): qty 1

## 2013-07-04 MED ORDER — NITROGLYCERIN 0.4 MG SL SUBL
0.4000 mg | SUBLINGUAL_TABLET | SUBLINGUAL | Status: DC | PRN
  Administered 2013-07-09: 0.4 mg via SUBLINGUAL
  Filled 2013-07-04: qty 25

## 2013-07-04 MED ORDER — ALLOPURINOL 300 MG PO TABS
300.0000 mg | ORAL_TABLET | Freq: Every morning | ORAL | Status: DC
  Administered 2013-07-04 – 2013-07-12 (×9): 300 mg via ORAL
  Filled 2013-07-04 (×10): qty 1

## 2013-07-04 MED ORDER — DIPHENHYDRAMINE-APAP (SLEEP) 25-500 MG PO TABS: 1.0000 | ORAL_TABLET | Freq: Every day | ORAL | Status: DC

## 2013-07-04 MED ORDER — VANCOMYCIN HCL 10 G IV SOLR
1250.0000 mg | Freq: Once | INTRAVENOUS | Status: AC
  Administered 2013-07-04: 1250 mg via INTRAVENOUS
  Filled 2013-07-04: qty 1250

## 2013-07-04 MED ORDER — DIPHENHYDRAMINE HCL 25 MG PO CAPS
25.0000 mg | ORAL_CAPSULE | Freq: Every day | ORAL | Status: DC
  Administered 2013-07-04 – 2013-07-11 (×8): 25 mg via ORAL
  Filled 2013-07-04 (×11): qty 1

## 2013-07-04 MED ORDER — INSULIN GLARGINE 100 UNIT/ML SOLOSTAR PEN: 18.0000 [IU] | PEN_INJECTOR | Freq: Every morning | SUBCUTANEOUS | Status: DC

## 2013-07-04 MED ORDER — DIPHENHYDRAMINE HCL 50 MG/ML IJ SOLN
12.5000 mg | Freq: Once | INTRAMUSCULAR | Status: AC
  Administered 2013-07-04: 12.5 mg via INTRAVENOUS
  Filled 2013-07-04: qty 1

## 2013-07-04 MED ORDER — BIOTIN 1000 MCG PO TABS: 1000.0000 ug | ORAL_TABLET | Freq: Every day | ORAL | Status: DC

## 2013-07-04 MED ORDER — LAMOTRIGINE 100 MG PO TABS
100.0000 mg | ORAL_TABLET | Freq: Two times a day (BID) | ORAL | Status: DC
  Administered 2013-07-04 – 2013-07-12 (×17): 100 mg via ORAL
  Filled 2013-07-04 (×19): qty 1

## 2013-07-04 MED ORDER — CLINDAMYCIN PHOSPHATE 600 MG/50ML IV SOLN
600.0000 mg | Freq: Four times a day (QID) | INTRAVENOUS | Status: DC
  Administered 2013-07-04: 600 mg via INTRAVENOUS
  Filled 2013-07-04 (×3): qty 50

## 2013-07-04 MED ORDER — INSULIN ASPART 100 UNIT/ML ~~LOC~~ SOLN
0.0000 [IU] | Freq: Every day | SUBCUTANEOUS | Status: DC
  Administered 2013-07-04 – 2013-07-05 (×2): 3 [IU] via SUBCUTANEOUS
  Administered 2013-07-07 – 2013-07-09 (×2): 2 [IU] via SUBCUTANEOUS

## 2013-07-04 MED ORDER — FERROUS SULFATE 325 (65 FE) MG PO TABS
325.0000 mg | ORAL_TABLET | Freq: Two times a day (BID) | ORAL | Status: DC
  Administered 2013-07-04 – 2013-07-12 (×17): 325 mg via ORAL
  Filled 2013-07-04 (×20): qty 1

## 2013-07-04 NOTE — ED Notes (Signed)
Admitting MD at bedside.

## 2013-07-04 NOTE — ED Notes (Signed)
Vancomycin Stopped. Pt had allergic reaction. Pt has redness going up arm and neck. Admitting MD Conley Rolls paged. Per Dr. Conley Rolls, stop infusion and start Azactam.

## 2013-07-04 NOTE — ED Provider Notes (Signed)
History    CSN: [id number] Arrival date & time 07/03/13  2159  First MD Initiated Contact with Patient 07/04/13 0020     Chief Complaint  Patient presents with  . Wound Infection   (Consider location/radiation/quality/duration/timing/severity/associated sxs/prior Treatment) HPI 79 year old female presents to the emergency department from home with complaint of increasing pain and redness and swelling to the lateral aspect of her right foot.  Patient has long-standing diabetic ulcer to the outside of her right fifth toe.  Patient was started on doxycycline 3 days ago by vascular surgery.  They're planning a procedure on the 15th and her concern for infection.  She has had previous amputations of toes on the same foot.  She is a brittle diabetic, has history of peripheral vascular disease, CAD, hyperlipidemia, hypertension.  She, reports she's had no improvement, and the wound, and this has actually worsened since being on the doxycycline.  She is also followed by a wound center in needed.  She's had increased drainage.  She denies any fever.  She, reports she's having some pain and swelling to her lower leg, as well.  Past Medical History  Diagnosis Date  . Essential hypertension, benign   . Gout   . GERD (gastroesophageal reflux disease)   . Systolic and diastolic CHF, chronic     Echo 04/05/12 EF 40-45%, anterolateral/posterior hypokinesis, grade 1 diastolic dysfunction, mild MR, mildly dilated LA  . Hyperlipidemia   . Coronary atherosclerosis of native coronary artery     s/p 4V CABG '97; NSTEMI -->Cardiac cath 04/03/12 revealed a new occlusion of SVG to OM system, Medical therapy  . Diabetes mellitus, type 2   . Seizures   . Arthritis   . Osteoporosis   . Retinal hemorrhage, both eyes   . Stroke     1980 and then 2 strokes 2012   Past Surgical History  Procedure Laterality Date  . Coronary artery bypass graft      1997  . Knee surgery      bilateral  . Abdominal hysterectomy     . Cardiac catheterization      11/2010-patent grafts  . Right foot surgery      big toe removed and other surgeries on rt foot because of infection  . Left hip hemi-arthroplasty  07-31-2011    SURGERY AT [facility] FOR FRACTURE LEFT FEMORAL NECK  . Total hip revision  01/23/2012    Procedure: TOTAL HIP REVISION;  Surgeon: Shelda Pal, MD;  Location: [facility];  Service: Orthopedics;  Laterality: Left;  Conversion of  Previous Surgery to a Left Total Hip  . Abdominal hysterectomy    . Joint replacement  Jan. 2013    Left Hip   Family History  Problem Relation Age of Onset  . Heart attack Father 89  . Heart disease Father    History  Substance Use Topics  . Smoking status: Never Smoker   . Smokeless tobacco: Never Used  . Alcohol Use: No   OB History   Grav Para Term Preterm Abortions TAB SAB Ect Mult Living                 Review of Systems  See History of Present Illness; otherwise all other systems are reviewed and negative  Allergies  Ace inhibitors; Cephalexin; Furosemide; Levofloxacin; Bactrim; Ciprocin-fluocin-procin; Ciprofloxacin; Codeine; Morphine and related; Pentazocine; Promethazine; Sulfa antibiotics; and Trimethoprim  Home Medications   Current Outpatient Rx  Name  Route  Sig  Dispense  Refill  .  allopurinol (ZYLOPRIM) 300 MG tablet   Oral   Take 300 mg by mouth every morning.          Marland Kitchen aspirin EC 81 MG tablet   Oral   Take 81 mg by mouth at bedtime.          Marland Kitchen atorvastatin (LIPITOR) 80 MG tablet   Oral   Take 80 mg by mouth at bedtime.          . Biotin 1000 MCG tablet   Oral   Take 1,000 mcg by mouth daily.         . Calcium Citrate (CITRACAL PO)   Oral   Take 2 tablets by mouth every morning.         . carvedilol (COREG) 6.25 MG tablet   Oral   Take 6.25 mg by mouth 2 (two) times daily with a meal.         . cholecalciferol (VITAMIN D) 1000 UNITS tablet   Oral   Take 1,000 Units by mouth every morning.          . clopidogrel  (PLAVIX) 75 MG tablet   Oral   Take 1 tablet by mouth every morning.          . diphenhydramine-acetaminophen (TYLENOL PM) 25-500 MG TABS   Oral   Take 1 tablet by mouth at bedtime.          Marland Kitchen DOXYCYCLINE HYCLATE PO   Oral   Take 1 tablet by mouth.         . ferrous sulfate 325 (65 FE) MG tablet   Oral   Take 325 mg by mouth 2 (two) times daily.          Marland Kitchen glyBURIDE (DIABETA) 5 MG tablet   Oral   Take 10 mg by mouth 2 (two) times daily with a meal.          . Glycerin-Hypromellose-PEG 400 (VISINE TEARS OP)   Both Eyes   Place 1 drop into both eyes daily as needed. For dry eyes         . isosorbide mononitrate (IMDUR) 30 MG 24 hr tablet   Oral   Take 30 mg by mouth every morning.          . lamoTRIgine (LAMICTAL) 100 MG tablet   Oral   Take 100 mg by mouth 2 (two) times daily.          Marland Kitchen LANTUS SOLOSTAR 100 UNIT/ML SOPN   Subcutaneous   Inject 18 Units into the skin every morning.          . meclizine (ANTIVERT) 12.5 MG tablet   Oral   Take 12.5 mg by mouth 3 (three) times daily as needed for dizziness. For inner ear/dizziness         . nitroGLYCERIN (NITROSTAT) 0.4 MG SL tablet   Sublingual   Place 0.4 mg under the tongue every 5 (five) minutes x 3 doses as needed for chest pain (up to 3 doses).         Marland Kitchen omeprazole (PRILOSEC) 20 MG capsule   Oral   Take 20 mg by mouth 2 (two) times daily.          . QC PEN NEEDLES 31G X 6 MM MISC      3 (three) times daily.         Marland Kitchen SANTYL ointment   Topical   Apply 1 application topically daily.          Marland Kitchen  torsemide (DEMADEX) 20 MG tablet   Oral   Take 20 mg by mouth every morning.           BP 153/63  Pulse 80  Temp(Src) 98.1 F (36.7 C) (Oral)  Resp 16  Ht 5\' 2"  (1.575 m)  Wt 158 lb (71.668 kg)  BMI 28.89 kg/m2  SpO2 98% Physical Exam  Nursing note and vitals reviewed. Constitutional: She is oriented to person, place, and time. She appears well-developed and well-nourished.   HENT:  Head: Normocephalic and atraumatic.  Nose: Nose normal.  Mouth/Throat: Oropharynx is clear and moist.  Eyes: Conjunctivae and EOM are normal. Pupils are equal, round, and reactive to light.  Neck: Normal range of motion. Neck supple. No JVD present. No tracheal deviation present. No thyromegaly present.  Cardiovascular: Normal rate, regular rhythm, normal heart sounds and intact distal pulses.  Exam reveals no gallop and no friction rub.   No murmur heard. Pulmonary/Chest: Effort normal and breath sounds normal. No stridor. No respiratory distress. She has no wheezes. She has no rales. She exhibits no tenderness.  Abdominal: Soft. Bowel sounds are normal. She exhibits no distension and no mass. There is no tenderness. There is no rebound and no guarding.  Musculoskeletal: Normal range of motion. She exhibits tenderness. She exhibits no edema.  Patient with ulcerative lesion, consistent with a diabetic ulcer to the lower aspect of her fifth toe.  There is no active draining at this time.  She does have erythema tracking up her foot.  There is warmth.  There is edema and tenderness.  All of this is contained to the foot.  There is no redness, swelling, or noted, skin changes, to the lower leg.  She has decreased sensation throughout the foot.  Lymphadenopathy:    She has no cervical adenopathy.  Neurological: She is alert and oriented to person, place, and time. She exhibits normal muscle tone. Coordination normal.  Skin: Skin is warm and dry. No rash noted. No erythema. No pallor.  Psychiatric: She has a normal mood and affect. Her behavior is normal. Judgment and thought content normal.    ED Course  Procedures (including critical care time) Labs Reviewed  CBC WITH DIFFERENTIAL - Abnormal; Notable for the following:    Monocytes Absolute 1.2 (*)    All other components within normal limits  CULTURE, BLOOD (ROUTINE X 2)  CULTURE, BLOOD (ROUTINE X 2)  BASIC METABOLIC PANEL   SEDIMENTATION RATE   Dg Foot Complete Right  07/03/2013   *RADIOLOGY REPORT*  Clinical Data: Wound infection.  Diabetic ulcer.  Redness, swelling.  RIGHT FOOT COMPLETE - 3+ VIEW  Comparison: 06/13/2013  Findings: Prior right first transmetatarsal amputation. Amputation of the distal phalanx and distal portion of the middle phalanx of the second toe.  Soft tissue defect noted over the fifth MTP joint, similar to prior study.  No underlying acute bony abnormality.  No convincing evidence for osteomyelitis.  IMPRESSION: Stable chronic findings.  No acute process.   Original Report Authenticated By: Charlett Nose, M.D.   1. Cellulitis of right foot   2. Chronic diabetic ulcer of right foot determined by examination     MDM  79 year old female with worsening diabetic ulcer to her right foot.  She is failing outpatient oral antibiotics.  X-ray does not show any significant osteomyelitis.  Will discuss with hospitalist for admission for IV antibiotics.  Olivia Mackie, MD 07/04/13 [phone]

## 2013-07-04 NOTE — Progress Notes (Signed)
[facility] PROGRESS NOTE  [name] WGN:[id number] DOB: 12-28-1930 DOA: 07/03/2013 PCP: Orvilla [facility] C  Assessment/Plan: Principal Problem:   Cellulitis and abscess of foot Active Problems:   Coronary atherosclerosis of native coronary artery   Hyperlipidemia   Essential hypertension, benign   Peripheral arterial occlusive disease   DM2 (diabetes mellitus, type 2)    Cellulitis Started the patient on vancomycin and Zosyn Reaction to vancomycin yesterday Change to Azactam and clindamycin Vascular surgery consult today  Diabetes continue bloody right, Lantus, sliding scale insulin  Hypertension continue him down, Demadex  Peripheral vascular disease continue Plavix  Code Status: full Family Communication: family updated about patient's clinical progress Disposition Plan:  As above    Brief narrative: [name] is an 79 y.o. female with hx of HTN, gout, DM2, and CAD s/p CABG, prior CVA with known PVD followed by Dr Linnell Fulling, scheduled for aortogram next week, presents to the ER with increase redness of her right foot. She has been at wound clinic for her ulcer on the lateral aspect of her foot. She had prior toe amputation also. Evaluation in the ER showed Cr of 1.22, BS of 237, with no leukocytosis. Hospitalist was asked to admit her for cellulitis   Consultants:  Vascular surgery  Procedures:  None  Antibiotics:  Azactam and clindamycin  HPI/Subjective: No complaints  Objective: Filed Vitals:   07/03/13 2215 07/03/13 2300 07/04/13 0211 07/04/13 0331  BP: 153/63  157/69 160/67  Pulse: 80  78 75  Temp: 98.1 F (36.7 C)  98.7 F (37.1 C) 97.6 F (36.4 C)  TempSrc: Oral  Oral Oral  Resp: 16  15 15   Height:  5\' 2"  (1.575 m)  5\' 2"  (1.575 m)  Weight:  71.668 kg (158 lb)  71.243 kg (157 lb 1 oz)  SpO2: 98%  97% 100%    Intake/Output Summary (Last 24 hours) at 07/04/13 0844 Last data filed at 07/04/13 0508  Gross per 24 hour  Intake     200 ml  Output      0 ml  Net    200 ml    Exam: HEART: Regular rate and rhythm. There are no murmur, rub, or gallops.  BACK: No kyphosis or scoliosis; no CVA tenderness  ABDOMEN: soft and non-tender; no masses, no organomegaly, normal abdominal bowel sounds; no pannus; no intertriginous candida. There is no rebound and no distention.  Rectal Exam: Not done  EXTREMITIES: No bone or joint deformity; age-appropriate arthropathy of the hands and knees; She has prior toe amputation. She has a dry ulcer on lateral foot, and surrounding cellulitis.There is no calf tenderness.  Genitalia: not examined  PULSES: 2+ and symmetric  SKIN: Normal hydration no rash or ulceration  CNS: Cranial nerves 2-12 grossly intact no focal lateralizing neurologic deficit. Speech is fluent; uvula elevated with phonation, facial symmetry and tongue midline. DTR are normal bilaterally, cerebella exam is intact, barbinski is negative and strengths are equaled bilaterally. No sensory loss    Data Reviewed: Basic Metabolic Panel:  Recent Labs Lab 07/04/13 0048  NA 132*  K 4.1  CL 94*  CO2 27  GLUCOSE 237*  BUN 40*  CREATININE 1.22*  CALCIUM 9.8    Liver Function Tests: No results found for this basename: AST, ALT, ALKPHOS, BILITOT, PROT, ALBUMIN,  in the last 168 hours No results found for this basename: LIPASE, AMYLASE,  in the last 168 hours No results found for this basename: AMMONIA,  in the last  168 hours  CBC:  Recent Labs Lab 07/04/13 0048  WBC 9.9  NEUTROABS 5.6  HGB 13.2  HCT 38.3  MCV 91.0  PLT 240    Cardiac Enzymes: No results found for this basename: CKTOTAL, CKMB, CKMBINDEX, TROPONINI,  in the last 168 hours BNP (last 3 results) No results found for this basename: PROBNP,  in the last 8760 hours   CBG: No results found for this basename: GLUCAP,  in the last 168 hours  No results found for this or any previous visit (from the past 240 hour(s)).   Studies: Dg Foot  Complete Right  07/03/2013   *RADIOLOGY REPORT*  Clinical Data: Wound infection.  Diabetic ulcer.  Redness, swelling.  RIGHT FOOT COMPLETE - 3+ VIEW  Comparison: 06/13/2013  Findings: Prior right first transmetatarsal amputation. Amputation of the distal phalanx and distal portion of the middle phalanx of the second toe.  Soft tissue defect noted over the fifth MTP joint, similar to prior study.  No underlying acute bony abnormality.  No convincing evidence for osteomyelitis.  IMPRESSION: Stable chronic findings.  No acute process.   Original Report Authenticated By: Charlett Nose, M.D.    Scheduled Meds: . acetaminophen  500 mg Oral QHS   And  . diphenhydrAMINE  25 mg Oral QHS  . allopurinol  300 mg Oral q morning - 10a  . aspirin EC  81 mg Oral QHS  . atorvastatin  80 mg Oral QHS  . aztreonam  1 g Intravenous Q8H  . carvedilol  6.25 mg Oral BID WC  . cholecalciferol  1,000 Units Oral q morning - 10a  . clindamycin (CLEOCIN) IV  600 mg Intravenous Q6H  . clopidogrel  75 mg Oral q morning - 10a  . ferrous sulfate  325 mg Oral BID WC  . glyBURIDE  10 mg Oral BID WC  . insulin aspart  0-15 Units Subcutaneous TID WC  . insulin aspart  0-5 Units Subcutaneous QHS  . insulin glargine  18 Units Subcutaneous Daily  . isosorbide mononitrate  30 mg Oral q morning - 10a  . lamoTRIgine  100 mg Oral BID  . pantoprazole  40 mg Oral Daily  . torsemide  20 mg Oral q morning - 10a  . [START ON 07/05/2013] vancomycin  1,250 mg Intravenous Q24H   Continuous Infusions:   Principal Problem:   Cellulitis and abscess of foot Active Problems:   Coronary atherosclerosis of native coronary artery   Hyperlipidemia   Essential hypertension, benign   Peripheral arterial occlusive disease   DM2 (diabetes mellitus, type 2)    Time spent: 40 minutes   [facility]  [facility] Pager [phone]. If 8PM-8AM, please contact night-coverage at [email or web address], password [email or web address] 07/04/2013, 8:44 AM  LOS: 1 day

## 2013-07-04 NOTE — ED Notes (Signed)
RN paged pharmacy to send Azactam.

## 2013-07-04 NOTE — H&P (Signed)
[facility] History and Physical  [name] WUJ:[id number] DOB: 1931-08-12    PCP:   [name]   Chief Complaint: redness of the right foot.  HPI: [name] is an 79 y.o. female with hx of HTN, gout, DM2, and CAD s/p CABG, prior CVA with known PVD followed by Dr Linnell Fulling, scheduled for aortogram next week, presents to the ER with increase redness of her right foot. She has been at wound clinic for her ulcer on the lateral aspect of her foot.  She had prior toe amputation also.  Evaluation in the ER showed Cr of 1.22, BS of 237, with no leukocytosis.  Hospitalist was asked to admit her for cellulitis.  Rewiew of Systems:  Constitutional: Negative for malaise, fever and chills. No significant weight loss or weight gain Eyes: Negative for eye pain, redness and discharge, diplopia, visual changes, or flashes of light. ENMT: Negative for ear pain, hoarseness, nasal congestion, sinus pressure and sore throat. No headaches; tinnitus, drooling, or problem swallowing. Cardiovascular: Negative for chest pain, palpitations, diaphoresis, dyspnea and peripheral edema. ; No orthopnea, PND Respiratory: Negative for cough, hemoptysis, wheezing and stridor. No pleuritic chestpain. Gastrointestinal: Negative for nausea, vomiting, diarrhea, constipation, abdominal pain, melena, blood in stool, hematemesis, jaundice and rectal bleeding.    Genitourinary: Negative for frequency, dysuria, incontinence,flank pain and hematuria; Musculoskeletal: Negative for back pain and neck pain. Negative for swelling and trauma.;  Skin: . Negative for pruritus, rash, abrasions, bruising and skin lesion.; ulcerations Neuro: Negative for headache, lightheadedness and neck stiffness. Negative for weakness, altered level of consciousness , altered mental status, extremity weakness, burning feet, involuntary movement, seizure and syncope.  Psych: negative for anxiety, depression, insomnia, tearfulness, panic  attacks, hallucinations, paranoia, suicidal or homicidal ideation    Past Medical History  Diagnosis Date  . Essential hypertension, benign   . Gout   . GERD (gastroesophageal reflux disease)   . Systolic and diastolic CHF, chronic     Echo 04/05/12 EF 40-45%, anterolateral/posterior hypokinesis, grade 1 diastolic dysfunction, mild MR, mildly dilated LA  . Hyperlipidemia   . Coronary atherosclerosis of native coronary artery     s/p 4V CABG '97; NSTEMI -->Cardiac cath 04/03/12 revealed a new occlusion of SVG to OM system, Medical therapy  . Diabetes mellitus, type 2   . Seizures   . Arthritis   . Osteoporosis   . Retinal hemorrhage, both eyes   . Stroke     1980 and then 2 strokes 2012    Past Surgical History  Procedure Laterality Date  . Coronary artery bypass graft      1997  . Knee surgery      bilateral  . Abdominal hysterectomy    . Cardiac catheterization      11/2010-patent grafts  . Right foot surgery      big toe removed and other surgeries on rt foot because of infection  . Left hip hemi-arthroplasty  07-31-2011    SURGERY AT [facility] FOR FRACTURE LEFT FEMORAL NECK  . Total hip revision  01/23/2012    Procedure: TOTAL HIP REVISION;  Surgeon: Shelda Pal, MD;  Location: [facility];  Service: Orthopedics;  Laterality: Left;  Conversion of  Previous Surgery to a Left Total Hip  . Abdominal hysterectomy    . Joint replacement  Jan. 2013    Left Hip    Medications:  HOME MEDS: Prior to Admission medications   Medication Sig Start Date End Date Taking? Authorizing Provider  allopurinol (ZYLOPRIM) 300 MG tablet Take 300 mg by mouth every morning.    Yes Historical Provider, MD  aspirin EC 81 MG tablet Take 81 mg by mouth at bedtime.    Yes Historical Provider, MD  atorvastatin (LIPITOR) 80 MG tablet Take 80 mg by mouth at bedtime.    Yes Jessica A Hope, PA-C  Biotin 1000 MCG tablet Take 1,000 mcg by mouth daily.   Yes Historical Provider, MD  Calcium Citrate (CITRACAL PO)  Take 2 tablets by mouth every morning.   Yes Historical Provider, MD  carvedilol (COREG) 6.25 MG tablet Take 6.25 mg by mouth 2 (two) times daily with a meal.   Yes Jessica A Hope, PA-C  cholecalciferol (VITAMIN D) 1000 UNITS tablet Take 1,000 Units by mouth every morning.    Yes Historical Provider, MD  clopidogrel (PLAVIX) 75 MG tablet Take 1 tablet by mouth every morning.  06/04/13  Yes Historical Provider, MD  diphenhydramine-acetaminophen (TYLENOL PM) 25-500 MG TABS Take 1 tablet by mouth at bedtime.    Yes Historical Provider, MD  DOXYCYCLINE HYCLATE PO Take 1 tablet by mouth.   Yes Historical Provider, MD  ferrous sulfate 325 (65 FE) MG tablet Take 325 mg by mouth 2 (two) times daily.    Yes Historical Provider, MD  glyBURIDE (DIABETA) 5 MG tablet Take 10 mg by mouth 2 (two) times daily with a meal.    Yes Historical Provider, MD  Glycerin-Hypromellose-PEG 400 (VISINE TEARS OP) Place 1 drop into both eyes daily as needed. For dry eyes   Yes Historical Provider, MD  isosorbide mononitrate (IMDUR) 30 MG 24 hr tablet Take 30 mg by mouth every morning.    Yes Jonelle Sidle, MD  lamoTRIgine (LAMICTAL) 100 MG tablet Take 100 mg by mouth 2 (two) times daily.  03/25/11  Yes Historical Provider, MD  LANTUS SOLOSTAR 100 UNIT/ML SOPN Inject 18 Units into the skin every morning.  06/04/13  Yes Historical Provider, MD  meclizine (ANTIVERT) 12.5 MG tablet Take 12.5 mg by mouth 3 (three) times daily as needed for dizziness. For inner ear/dizziness   Yes Historical Provider, MD  nitroGLYCERIN (NITROSTAT) 0.4 MG SL tablet Place 0.4 mg under the tongue every 5 (five) minutes x 3 doses as needed for chest pain (up to 3 doses).   Yes Jessica A Hope, PA-C  omeprazole (PRILOSEC) 20 MG capsule Take 20 mg by mouth 2 (two) times daily.    Yes Historical Provider, MD  QC PEN NEEDLES 31G X 6 MM MISC 3 (three) times daily. 06/04/13  Yes Historical Provider, MD  SANTYL ointment Apply 1 application topically daily.   06/21/13  Yes Historical Provider, MD  torsemide (DEMADEX) 20 MG tablet Take 20 mg by mouth every morning.  10/04/11  Yes Historical Provider, MD     Allergies:  Allergies  Allergen Reactions  . Ace Inhibitors     Hyperkalemia  . Cephalexin     Reaction unknown-but on pt's list of medications that she was told she was very allergic to.  . Furosemide Other (See Comments)    presyncope  . Levofloxacin Swelling  . Bactrim Rash  . Ciprocin-Fluocin-Procin (Fluocinolone Acetonide) Rash  . Ciprofloxacin Rash  . Codeine Rash  . Morphine And Related Rash  . Pentazocine Rash  . Promethazine Swelling and Rash  . Sulfa Antibiotics Swelling and Rash  . Trimethoprim Swelling and Rash    Social History:   reports that she has never smoked. She has never used smokeless  tobacco. She reports that she does not drink alcohol or use illicit drugs.  Family History: Family History  Problem Relation Age of Onset  . Heart attack Father 34  . Heart disease Father      Physical Exam: Filed Vitals:   07/03/13 2215 07/03/13 2300 07/04/13 0211 07/04/13 0331  BP: 153/63  157/69 160/67  Pulse: 80  78 75  Temp: 98.1 F (36.7 C)  98.7 F (37.1 C) 97.6 F (36.4 C)  TempSrc: Oral  Oral Oral  Resp: 16  15 15   Height:  5\' 2"  (1.575 m)  5\' 2"  (1.575 m)  Weight:  71.668 kg (158 lb)  71.243 kg (157 lb 1 oz)  SpO2: 98%  97% 100%   Blood pressure 160/67, pulse 75, temperature 97.6 F (36.4 C), temperature source Oral, resp. rate 15, height 5\' 2"  (1.575 m), weight 71.243 kg (157 lb 1 oz), SpO2 100.00%.  GEN:  Pleasant  patient lying in the stretcher in no acute distress; cooperative with exam. PSYCH:  alert and oriented x4; does not appear anxious or depressed; affect is appropriate. HEENT: Mucous membranes pink and anicteric; PERRLA; EOM intact; no cervical lymphadenopathy nor thyromegaly or carotid bruit; no JVD; There were no stridor. Neck is very supple. Breasts:: Not examined CHEST WALL: No  tenderness CHEST: Normal respiration, clear to auscultation bilaterally.  HEART: Regular rate and rhythm.  There are no murmur, rub, or gallops.   BACK: No kyphosis or scoliosis; no CVA tenderness ABDOMEN: soft and non-tender; no masses, no organomegaly, normal abdominal bowel sounds; no pannus; no intertriginous candida. There is no rebound and no distention. Rectal Exam: Not done EXTREMITIES: No bone or joint deformity; age-appropriate arthropathy of the hands and knees; She has prior toe amputation.  She has a dry ulcer on lateral foot, and surrounding cellulitis.There is no calf tenderness. Genitalia: not examined PULSES: 2+ and symmetric SKIN: Normal hydration no rash or ulceration CNS: Cranial nerves 2-12 grossly intact no focal lateralizing neurologic deficit.  Speech is fluent; uvula elevated with phonation, facial symmetry and tongue midline. DTR are normal bilaterally, cerebella exam is intact, barbinski is negative and strengths are equaled bilaterally.  No sensory loss.   Labs on Admission:  Basic Metabolic Panel:  Recent Labs Lab 07/04/13 0048  NA 132*  K 4.1  CL 94*  CO2 27  GLUCOSE 237*  BUN 40*  CREATININE 1.22*  CALCIUM 9.8   Liver Function Tests: No results found for this basename: AST, ALT, ALKPHOS, BILITOT, PROT, ALBUMIN,  in the last 168 hours No results found for this basename: LIPASE, AMYLASE,  in the last 168 hours No results found for this basename: AMMONIA,  in the last 168 hours CBC:  Recent Labs Lab 07/04/13 0048  WBC 9.9  NEUTROABS 5.6  HGB 13.2  HCT 38.3  MCV 91.0  PLT 240   Cardiac Enzymes: No results found for this basename: CKTOTAL, CKMB, CKMBINDEX, TROPONINI,  in the last 168 hours  CBG: No results found for this basename: GLUCAP,  in the last 168 hours   Radiological Exams on Admission: Dg Foot Complete Right  07/03/2013   *RADIOLOGY REPORT*  Clinical Data: Wound infection.  Diabetic ulcer.  Redness, swelling.  RIGHT FOOT COMPLETE  - 3+ VIEW  Comparison: 06/13/2013  Findings: Prior right first transmetatarsal amputation. Amputation of the distal phalanx and distal portion of the middle phalanx of the second toe.  Soft tissue defect noted over the fifth MTP joint, similar to prior study.  No underlying acute bony abnormality.  No convincing evidence for osteomyelitis.  IMPRESSION: Stable chronic findings.  No acute process.   Original Report Authenticated By: Charlett Nose, M.D.    Assessment/Plan Present on Admission:  . Peripheral arterial occlusive disease . Hyperlipidemia . Essential hypertension, benign . Coronary atherosclerosis of native coronary artery . DM2 (diabetes mellitus, type 2) Cellulitis of right foot. +++  PLAN:  She will be admitted for cellulitis.  She was given Vancomycin in the ER, but developed ? "redman syndrome" though her Zenaida Niece was given at the rate over 90 minutes.  Will give her Azactam and Clindamycin.  Please consult Dr Linnell Fulling since he has been taking care of her.  Her other problems were stable and her home meds were continued.  She is a full code and will be admitted to [facility] service.  Thank you for allowing me to partake in the care of this nice patient.  Other plans as per orders.  Code Status: FULL Unk Lightning, MD. [facility] Pager [phone] 7pm to 7am.  07/04/2013, 4:43 AM

## 2013-07-04 NOTE — Progress Notes (Signed)
ANTIBIOTIC CONSULT NOTE - INITIAL  Pharmacy Consult for vancomycin Indication: cellulitis  Allergies  Allergen Reactions  . Ace Inhibitors     Hyperkalemia  . Cephalexin     Reaction unknown-but on pt's list of medications that she was told she was very allergic to.  . Furosemide Other (See Comments)    presyncope  . Levofloxacin Swelling  . Bactrim Rash  . Ciprocin-Fluocin-Procin (Fluocinolone Acetonide) Rash  . Ciprofloxacin Rash  . Codeine Rash  . Morphine And Related Rash  . Pentazocine Rash  . Promethazine Swelling and Rash  . Sulfa Antibiotics Swelling and Rash  . Trimethoprim Swelling and Rash    Patient Measurements: Height: 5\' 2"  (157.5 cm) Weight: 158 lb (71.668 kg) IBW/kg (Calculated) : 50.1  Vital Signs: Temp: 98.1 F (36.7 C) (07/09 2215) Temp src: Oral (07/09 2215) BP: 153/63 mmHg (07/09 2215) Pulse Rate: 80 (07/09 2215)  Labs: Estimated Creatinine Clearance: 45.7 ml/min (by C-G formula based on Cr of 0.91).   Microbiology: No results found for this or any previous visit (from the past 720 hour(s)).  Medical History: Past Medical History  Diagnosis Date  . Essential hypertension, benign   . Gout   . GERD (gastroesophageal reflux disease)   . Systolic and diastolic CHF, chronic     Echo 04/05/12 EF 40-45%, anterolateral/posterior hypokinesis, grade 1 diastolic dysfunction, mild MR, mildly dilated LA  . Hyperlipidemia   . Coronary atherosclerosis of native coronary artery     s/p 4V CABG '97; NSTEMI -->Cardiac cath 04/03/12 revealed a new occlusion of SVG to OM system, Medical therapy  . Diabetes mellitus, type 2   . Seizures   . Arthritis   . Osteoporosis   . Retinal hemorrhage, both eyes   . Stroke     1980 and then 2 strokes 2012    Assessment: 79yo female c/o toe infection w/ pain/swelling/drainage, has been on doxycycline per wound clinic without improvement, to begin IV ABX for cellulitis.  Goal of Therapy:  Vancomycin trough level  10-15 mcg/ml  Plan:  Will start vancomycin 1250mg  IV Q24H and monitor CBC, Cx, levels prn.  Vernard Gambles, PharmD, BCPS  07/04/2013,12:49 AM

## 2013-07-04 NOTE — Consult Note (Deleted)
[name] WAS ADMITTED SECONDARY TO RIGHT FOOT CELLULITIS 07/04/2013.  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  She is currently on Azactam and Clindamycin.      Dr [name] office note 07/01/13:  Patient name: [name] MRN: [id number] DOB: [birth year] Sex: female  Referred by: Dr. Marcha Proctor  Reason for referral:  Chief Complaint   Patient presents with   .  New Evaluation     Non-healing ulcer C/O Right lateral foot painful with walking and at rest, duation3 mo. Pt. had vascular lab study today.     HISTORY OF PRESENT ILLNESS:  The patient is referred for evaluation of a right leg ulcer. The patient states that it has been present for approximately 3 months. She is being seen at the wound center for serial debridements and dressing changes. This does cause her significant pain. She has a history of a right great toe amputation in 2002 which has healed.  The patient is a brittle diabetic and has had trouble regulating her blood sugars. She suffers from hypercholesterolemia and is treated with a statin. She also has coronary artery disease. She is status post CABG. She had an MI last April which she was told needed to be treated medically. She has never smoked.    Past Medical History   Diagnosis  Date   .  Essential hypertension, benign    .  Gout    .  GERD (gastroesophageal reflux disease)    .  Systolic and diastolic CHF, chronic      Echo 04/05/12 EF 40-45%, anterolateral/posterior hypokinesis, grade 1 diastolic dysfunction, mild MR, mildly dilated LA   .  Hyperlipidemia    .  Coronary atherosclerosis of native coronary artery      s/p 4V CABG '97; NSTEMI -->Cardiac cath 04/03/12 revealed a new occlusion of SVG to OM system, Medical therapy   .  Diabetes mellitus, type 2    .  Seizures    .  Arthritis    .  Osteoporosis    .  Retinal hemorrhage, both eyes    .  Stroke      1980 and then 2 strokes 2012    Past Surgical History   Procedure  Laterality   Date   .  Coronary artery bypass graft       1997   .  Knee surgery       bilateral   .  Abdominal hysterectomy     .  Cardiac catheterization       11/2010-patent grafts   .  Right foot surgery       big toe removed and other surgeries on rt foot because of infection   .  Left hip hemi-arthroplasty   07-31-2011     SURGERY AT [facility] FOR FRACTURE LEFT FEMORAL NECK   .  Total hip revision   01/23/2012     Procedure: TOTAL HIP REVISION; Surgeon: Shelda Pal, MD; Location: [facility]; Service: Orthopedics; Laterality: Left; Conversion of Previous Surgery to a Left Total Hip   .  Abdominal hysterectomy     .  Joint replacement   Jan. 2013     Left Hip    History    Social History   .  Marital Status:  Widowed     Spouse Name:  N/A     Number of Children:  N/A   .  Years of Education:  N/A    Occupational History   .  Not on file.    Social History Main Topics   .  Smoking status:  Never Smoker   .  Smokeless tobacco:  Never Used   .  Alcohol Use:  No   .  Drug Use:  No   .  Sexually Active:  Not Currently     Birth Control/ Protection:  Post-menopausal    Other Topics  Concern   .  Not on file    Social History Narrative   .  No narrative on file    Family History   Problem  Relation  Age of Onset   .  Heart attack  Father  14   .  Heart disease  Father     Allergies as of 07/01/2013 - Review Complete 07/01/2013   Allergen  Reaction  Noted   .  Ace inhibitors   10/20/2011   .  Cephalexin   01/18/2012   .  Furosemide  Other (See Comments)  04/13/2011   .  Levofloxacin  Swelling  10/20/2011   .  Bactrim  Rash  04/13/2011   .  Ciprocin-fluocin-procin (fluocinolone acetonide)  Rash  04/13/2011   .  Ciprofloxacin  Rash  01/11/2012   .  Codeine  Rash  04/13/2011   .  Morphine and related  Rash  04/13/2011   .  Pentazocine  Rash  04/13/2011   .  Promethazine  Swelling and Rash  01/11/2012   .  Sulfa antibiotics  Swelling and Rash  01/11/2012   .  Trimethoprim  Swelling  and Rash  01/11/2012    Current Outpatient Prescriptions on File Prior to Visit   Medication  Sig  Dispense  Refill   .  allopurinol (ZYLOPRIM) 300 MG tablet  Take 300 mg by mouth daily.     Marland Kitchen  amoxicillin (AMOXIL) 500 MG capsule  Take 2,000 mg by mouth as directed. Take 4 capsules 1 hour prior to dental procedure.     Marland Kitchen  aspirin EC 81 MG tablet  Take 81 mg by mouth daily.     Marland Kitchen  atorvastatin (LIPITOR) 80 MG tablet  Take 80 mg by mouth daily.     .  Biotin 1000 MCG tablet  Take 1,000 mcg by mouth daily.     .  carvedilol (COREG) 6.25 MG tablet  Take 6.25 mg by mouth 2 (two) times daily with a meal.     .  cholecalciferol (VITAMIN D) 1000 UNITS tablet  Take 1,000 Units by mouth daily.     .  clopidogrel (PLAVIX) 75 MG tablet  Take 1 tablet by mouth daily.     .  diphenhydramine-acetaminophen (TYLENOL PM) 25-500 MG TABS  Take 1 tablet by mouth at bedtime as needed. For sleep     .  ferrous sulfate 325 (65 FE) MG tablet  Take 325 mg by mouth 3 (three) times daily with meals.     Marland Kitchen  glyBURIDE (DIABETA) 5 MG tablet  Take 10 mg by mouth 2 (two) times daily with a meal.     .  Glycerin-Hypromellose-PEG 400 (VISINE TEARS OP)  Apply to eye as directed.     .  isosorbide mononitrate (IMDUR) 30 MG 24 hr tablet  Take 30 mg by mouth daily.     Marland Kitchen  lamoTRIgine (LAMICTAL) 100 MG tablet  Take 100 mg by mouth 2 (two) times daily.     Marland Kitchen  LANTUS SOLOSTAR 100 UNIT/ML SOPN  Inject 18 Units into the skin daily.     Marland Kitchen  meclizine (ANTIVERT) 12.5 MG tablet  Take 12.5 mg by mouth 3 (three) times daily as needed.     .  nitroGLYCERIN (NITROSTAT) 0.4 MG SL tablet  Place 0.4 mg under the tongue every 5 (five) minutes x 3 doses as needed for chest pain (up to 3 doses).     Marland Kitchen  omeprazole (PRILOSEC) 20 MG capsule  Take 20 mg by mouth 2 (two) times daily.     Marland Kitchen  torsemide (DEMADEX) 20 MG tablet  Take 20 mg by mouth Daily.      No current facility-administered medications on file prior to visit.   REVIEW OF SYSTEMS:   Cardiovascular: No chest pain, chest pressure, palpitations, orthopnea, or dyspnea on exertion. Positive for pain in her feet when lying flat  Pulmonary: No productive cough, asthma or wheezing.  Neurologic: No weakness, paresthesias, aphasia, or amaurosis. No dizziness.  Hematologic: No bleeding problems or clotting disorders.  Musculoskeletal: Right great toe dictation  Gastrointestinal: No blood in stool or hematemesis  Genitourinary: No dysuria or hematuria.  Psychiatric:: No history of major depression.  Integumentary: No rashes or ulcers.  Constitutional: No fever or chills.    PHYSICAL EXAMINATION:  General: The patient appears their stated age. Vital signs are BP 158/83  Pulse 75  Resp 16  Ht 5\' 2"  (1.575 m)  Wt 158 lb (71.668 kg)  BMI 28.89 kg/m2  SpO2 98%  HEENT: No gross abnormalities  Pulmonary: Respirations are non-labored  Abdomen: Soft and non-tender  Musculoskeletal: Right great toe is surgically absent  Neurologic: No focal weakness or paresthesias are detected,  Skin: 2 cm ulcer at the base of the right fifth toe which tracks down to the bone. There is surrounding erythema.  Psychiatric: The patient has normal affect.  Cardiovascular: There is a regular rate and rhythm without significant murmur appreciated. Pedal pulses are nonpalpable.  Diagnostic Studies:  Outside ankle-brachial indices were 0.6 on the right and 0.8 on the left. Duplex ultrasound was reviewed in our office today. This is a high-grade mid superficial femoral artery stenosis.  Outside Studies/Documentation  Historical records were reviewed. They showed nonhealing right toe ulcer in the setting of peripheral vascular disease  Medication Changes:  None  Assessment:  Nonhealing right fifth toe ulcer  Plan:  I discussed with the patient that this is a limb threatening situation. She needs to undergo further arterial evaluation via angiography. We discussed the details of the procedure. If she is  a candidate for percutaneous intervention L. proceed as indicated. I will plan on accessing her left groin, studying both legs and treating the right if possible. I'm giving her doxycycline today because I think she does have a component of cellulitis in the wound. I would like for this to resolve somewhat prior to proceeding with her angiography which I have scheduled for Tuesday, July 15th  Jorge Ny, M.D.  [facility]  Office: [phone]  Pager: [phone]

## 2013-07-04 NOTE — Progress Notes (Signed)
PHARMACIST - PHYSICIAN ORDER COMMUNICATION  CONCERNING: P&T Medication Policy on Herbal Medications  DESCRIPTION:  This patient's order for:  biotin  has been noted.  This product(s) is classified as an "herbal" or natural product. Due to a lack of definitive safety studies or FDA approval, nonstandard manufacturing practices, plus the potential risk of unknown drug-drug interactions while on inpatient medications, the Pharmacy and Therapeutics Committee does not permit the use of "herbal" or natural products of this type within [facility].   ACTION TAKEN: The pharmacy department is unable to verify this order at this time and your patient has been informed of this safety policy. Please reevaluate patient's clinical condition at discharge and address if the herbal or natural product(s) should be resumed at that time. Herby Abraham, Pharm.D. [phone] 07/04/2013 3:37 AM

## 2013-07-04 NOTE — Consult Note (Signed)
[facility] [place] CONSULT NOTE 07/04/2013 DOB: [id number] MRN : [id number]  BJ:[id number] foot cellulitis Referring Physician:Peter Conley Rolls, MD   History of Present Illness: 79 y.o. female with hx of HTN, gout, DM2, and CAD s/p CABG, prior CVA with known PVD followed by Dr Linnell Fulling.  She is scheduled for aortogram on July 15th for non healing right foot ulcer.  ABI showed 0.6 on the right  And 0.8 on the left.  She came to the ER secondary to edema and erythema of the right lower extremity and foot.  She is currently on Azactam and Clindamycin.      Past Medical History  Diagnosis Date  . Essential hypertension, benign   . Gout   . GERD (gastroesophageal reflux disease)   . Systolic and diastolic CHF, chronic     Echo 04/05/12 EF 40-45%, anterolateral/posterior hypokinesis, grade 1 diastolic dysfunction, mild MR, mildly dilated LA  . Hyperlipidemia   . Coronary atherosclerosis of native coronary artery     s/p 4V CABG '97; NSTEMI -->Cardiac cath 04/03/12 revealed a new occlusion of SVG to OM system, Medical therapy  . Diabetes mellitus, type 2   . Seizures   . Arthritis   . Osteoporosis   . Retinal hemorrhage, both eyes   . Stroke     1980 and then 2 strokes 2012    Past Surgical History  Procedure Laterality Date  . Coronary artery bypass graft      1997  . Knee surgery      bilateral  . Abdominal hysterectomy    . Cardiac catheterization      11/2010-patent grafts  . Right foot surgery      big toe removed and other surgeries on rt foot because of infection  . Left hip hemi-arthroplasty  07-31-2011    SURGERY AT [facility] FOR FRACTURE LEFT FEMORAL NECK  . Total hip revision  01/23/2012    Procedure: TOTAL HIP REVISION;  Surgeon: Shelda Pal, MD;  Location: [facility];  Service: Orthopedics;  Laterality: Left;  Conversion of  Previous Surgery to a Left Total Hip  . Abdominal hysterectomy    . Joint replacement  Jan. 2013    Left Hip     ROS: [x]  Positive  [ ]  Denies     General: [ ]  Weight loss, [ ]  Fever, [ ]  chills Neurologic: [ ]  Dizziness, [ ]  Blackouts, [ ]  Seizure [ ]  Stroke, [ ]  "Mini stroke", [ ]  Slurred speech, [ ]  Temporary blindness; [ ]  weakness in arms or legs, [ ]  Hoarseness Cardiac: [ ]  Chest pain/pressure, [ ]  Shortness of breath at rest [ ]  Shortness of breath with exertion, [ ]  Atrial fibrillation or irregular heartbeat Vascular: [ ]  Pain in legs with walking, [x ] Pain in legs at rest, [ ]  Pain in legs at night,  [ ]  Non-healing ulcer, [ ]  Blood clot in vein/DVT,   Pulmonary: [ ]  Home oxygen, [ ]  Productive cough, [ ]  Coughing up blood, [ ]  Asthma,  [ ]  Wheezing Musculoskeletal:  [ ]  Arthritis, [ ]  Low back pain, [ ]  Joint pain Hematologic: [ ]  Easy Bruising, [ ]  Anemia; [ ]  Hepatitis Gastrointestinal: [ ]  Blood in stool, [ ]  Gastroesophageal Reflux/heartburn, [ ]  Trouble swallowing Urinary: [ ]  chronic Kidney disease, [ ]  on HD - [ ]  MWF or [ ]  TTHS, [ ]  Burning with urination, [ ]  Difficulty urinating Skin: [ ]  Rashes, [x ] Wounds right lateral foot Psychological: [ ]   Anxiety, [ ]  Depression  Social History History  Substance Use Topics  . Smoking status: Never Smoker   . Smokeless tobacco: Never Used  . Alcohol Use: No    Family History Family History  Problem Relation Age of Onset  . Heart attack Father 71  . Heart disease Father     Allergies  Allergen Reactions  . Ace Inhibitors     Hyperkalemia  . Cephalexin     Reaction unknown-but on pt's list of medications that she was told she was very allergic to.  . Furosemide Other (See Comments)    presyncope  . Levofloxacin Swelling  . Bactrim Rash  . Ciprocin-Fluocin-Procin (Fluocinolone Acetonide) Rash  . Ciprofloxacin Rash  . Codeine Rash  . Morphine And Related Rash  . Pentazocine Rash  . Promethazine Swelling and Rash  . Sulfa Antibiotics Swelling and Rash  . Trimethoprim Swelling and Rash    Current Facility-Administered Medications  Medication Dose  Route Frequency Provider Last Rate Last Dose  . acetaminophen (TYLENOL) tablet 500 mg  500 mg Oral QHS Houston Siren, MD       And  . diphenhydrAMINE (BENADRYL) capsule 25 mg  25 mg Oral QHS Houston Siren, MD      . allopurinol (ZYLOPRIM) tablet 300 mg  300 mg Oral q morning - 10a Houston Siren, MD   300 mg at 07/04/13 1026  . aspirin EC tablet 81 mg  81 mg Oral QHS Houston Siren, MD      . atorvastatin (LIPITOR) tablet 80 mg  80 mg Oral QHS Houston Siren, MD      . aztreonam (AZACTAM) 1 g in dextrose 5 % 50 mL IVPB  1 g Intravenous Q8H Houston Siren, MD   1 g at 07/04/13 1406  . carvedilol (COREG) tablet 6.25 mg  6.25 mg Oral BID WC Houston Siren, MD   6.25 mg at 07/04/13 1610  . cholecalciferol (VITAMIN D) tablet 1,000 Units  1,000 Units Oral q morning - 10a Houston Siren, MD   1,000 Units at 07/04/13 1027  . clindamycin (CLEOCIN) IVPB 600 mg  600 mg Intravenous Q8H Richarda Overlie, MD   600 mg at 07/04/13 1406  . clopidogrel (PLAVIX) tablet 75 mg  75 mg Oral q morning - 10a Houston Siren, MD   75 mg at 07/04/13 1025  . ferrous sulfate tablet 325 mg  325 mg Oral BID WC Houston Siren, MD   325 mg at 07/04/13 1025  . glyBURIDE (DIABETA) tablet 10 mg  10 mg Oral BID WC Houston Siren, MD   10 mg at 07/04/13 9604  . insulin aspart (novoLOG) injection 0-15 Units  0-15 Units Subcutaneous TID WC Houston Siren, MD   8 Units at 07/04/13 1217  . insulin aspart (novoLOG) injection 0-5 Units  0-5 Units Subcutaneous QHS Houston Siren, MD      . insulin glargine (LANTUS) injection 18 Units  18 Units Subcutaneous Daily Houston Siren, MD   18 Units at 07/04/13 1026  . isosorbide mononitrate (IMDUR) 24 hr tablet 30 mg  30 mg Oral q morning - 10a Houston Siren, MD   30 mg at 07/04/13 1025  . lamoTRIgine (LAMICTAL) tablet 100 mg  100 mg Oral BID Houston Siren, MD   100 mg at 07/04/13 1026  . nitroGLYCERIN (NITROSTAT) SL tablet 0.4 mg  0.4 mg Sublingual Q5 Min x 3 PRN Houston Siren, MD      . pantoprazole (PROTONIX) EC tablet 40 mg  40 mg Oral Daily  Houston Siren, MD   40 mg at 07/04/13 1216  . polyvinyl  alcohol (LIQUIFILM TEARS) 1.4 % ophthalmic solution 1 drop  1 drop Both Eyes QID PRN Houston Siren, MD      . torsemide [facility]) tablet 20 mg  20 mg Oral q morning - 10a Houston Siren, MD   20 mg at 07/04/13 1026     Imaging: Dg Foot Complete Right  07/03/2013   *RADIOLOGY REPORT*  Clinical Data: Wound infection.  Diabetic ulcer.  Redness, swelling.  RIGHT FOOT COMPLETE - 3+ VIEW  Comparison: 06/13/2013  Findings: Prior right first transmetatarsal amputation. Amputation of the distal phalanx and distal portion of the middle phalanx of the second toe.  Soft tissue defect noted over the fifth MTP joint, similar to prior study.  No underlying acute bony abnormality.  No convincing evidence for osteomyelitis.  IMPRESSION: Stable chronic findings.  No acute process.   Original Report Authenticated By: Charlett Nose, M.D.    Significant Diagnostic Studies: CBC Lab Results  Component Value Date   WBC 9.9 07/04/2013   HGB 13.2 07/04/2013   HCT 38.3 07/04/2013   MCV 91.0 07/04/2013   PLT 240 07/04/2013    BMET    Component Value Date/Time   NA 132* 07/04/2013 0048   K 4.1 07/04/2013 0048   CL 94* 07/04/2013 0048   CO2 27 07/04/2013 0048   GLUCOSE 237* 07/04/2013 0048   BUN 40* 07/04/2013 0048   CREATININE 1.22* 07/04/2013 0048   CALCIUM 9.8 07/04/2013 0048   GFRNONAA 41* 07/04/2013 0048   GFRAA 47* 07/04/2013 0048    COAG Lab Results  Component Value Date   INR 0.91 04/03/2012   INR 0.97 01/18/2012   INR 1.51* 08/03/2011   No results found for this basename: PTT     Physical Examination BP Readings from Last 3 Encounters:  07/04/13 155/60  07/01/13 158/83  06/07/13 139/76   Temp Readings from Last 3 Encounters:  07/04/13 97.6 F (36.4 C) Oral  04/05/12 97.9 F (36.6 C) Oral  04/05/12 97.9 F (36.6 C) Oral   SpO2 Readings from Last 3 Encounters:  07/04/13 100%  07/01/13 98%  12/07/12 97%   Pulse Readings from Last 3 Encounters:  07/04/13 88  07/01/13 75  06/07/13 87    General:  WDWN in  NAD HENT: WNL Eyes: Pupils equal Pulmonary: normal non-labored breathing , without Rales, rhonchi,  wheezing Cardiac: RRR, without  Murmurs, rubs or gallops; No carotid bruits Abdomen: soft, NT, no masses Skin: no rashes, ulcers noted Diagnostic Studies:  Outside ankle-brachial indices were 0.6 on the right and 0.8 on the left. Duplex ultrasound was reviewed in our office today. This is a high-grade mid superficial femoral artery stenosis.   Extremity; 2 cm ulcer at the base of the right fifth toe which tracks down to the bone. There is surrounding erythema Musculoskeletal: no muscle wasting or atrophy  Right great toe amputation Neurologic: A&O X 3; Appropriate Affect ;  SENSATION: normal; MOTOR FUNCTION: Pt has good and equal strength in all extremities - 5/5 Speech is fluent/normal  Non-Invasive Vascular Imaging: Diagnostic Studies:  Outside ankle-brachial indices were 0.6 on the right and 0.8 on the left. Duplex ultrasound was reviewed in our office today. This is a high-grade mid superficial femoral artery stenosis.   ASSESSMENT/PLAN: Right LE cellulitis resolving with antibiotic treatment PAD with Non healing ulcer right foot. Plan angiogram on July 15th with Dr. Myra Gianotti pending complete resolution of cellulitis.  COLLINS, EMMA  MAUREEN 07/04/2013 3:25 PM

## 2013-07-05 ENCOUNTER — Encounter (HOSPITAL_COMMUNITY): Payer: Self-pay | Admitting: Nurse Practitioner

## 2013-07-05 DIAGNOSIS — E785 Hyperlipidemia, unspecified: Secondary | ICD-10-CM

## 2013-07-05 DIAGNOSIS — I743 Embolism and thrombosis of arteries of the lower extremities: Secondary | ICD-10-CM

## 2013-07-05 DIAGNOSIS — L98499 Non-pressure chronic ulcer of skin of other sites with unspecified severity: Secondary | ICD-10-CM

## 2013-07-05 DIAGNOSIS — I10 Essential (primary) hypertension: Secondary | ICD-10-CM

## 2013-07-05 DIAGNOSIS — L03119 Cellulitis of unspecified part of limb: Secondary | ICD-10-CM

## 2013-07-05 DIAGNOSIS — I739 Peripheral vascular disease, unspecified: Secondary | ICD-10-CM

## 2013-07-05 DIAGNOSIS — L02619 Cutaneous abscess of unspecified foot: Secondary | ICD-10-CM

## 2013-07-05 DIAGNOSIS — I2510 Atherosclerotic heart disease of native coronary artery without angina pectoris: Secondary | ICD-10-CM

## 2013-07-05 DIAGNOSIS — E119 Type 2 diabetes mellitus without complications: Secondary | ICD-10-CM

## 2013-07-05 DIAGNOSIS — I369 Nonrheumatic tricuspid valve disorder, unspecified: Secondary | ICD-10-CM

## 2013-07-05 LAB — COMPREHENSIVE METABOLIC PANEL
ALT: 9 U/L (ref 0–35)
BUN: 38 mg/dL — ABNORMAL HIGH (ref 6–23)
Calcium: 9.1 mg/dL (ref 8.4–10.5)
GFR calc Af Amer: 51 mL/min — ABNORMAL LOW (ref >90)
Glucose, Bld: 93 mg/dL (ref 70–99)
Sodium: 138 mEq/L (ref 135–145)
Total Protein: 6.9 g/dL (ref 6.0–8.3)

## 2013-07-05 LAB — GLUCOSE, CAPILLARY: Glucose-Capillary: 119 mg/dL — ABNORMAL HIGH (ref 70–99)

## 2013-07-05 MED ORDER — HEPARIN SODIUM (PORCINE) 5000 UNIT/ML IJ SOLN
5000.0000 [IU] | Freq: Three times a day (TID) | INTRAMUSCULAR | Status: DC
  Administered 2013-07-06 – 2013-07-12 (×17): 5000 [IU] via SUBCUTANEOUS
  Filled 2013-07-05 (×22): qty 1

## 2013-07-05 MED ORDER — INSULIN GLARGINE 100 UNIT/ML ~~LOC~~ SOLN
25.0000 [IU] | Freq: Every day | SUBCUTANEOUS | Status: DC
  Administered 2013-07-05 – 2013-07-06 (×2): 25 [IU] via SUBCUTANEOUS
  Filled 2013-07-05 (×3): qty 0.25

## 2013-07-05 MED ORDER — HEPARIN SODIUM (PORCINE) 5000 UNIT/ML IJ SOLN
5000.0000 [IU] | Freq: Three times a day (TID) | INTRAMUSCULAR | Status: DC
Start: 2013-07-05 — End: 2013-07-05
  Filled 2013-07-05 (×2): qty 1

## 2013-07-05 NOTE — Consult Note (Addendum)
I agree with the above. The patient was recently seen in the office for a right foot ulcer with diminished ankle-brachial indices. She was placed on IV antibiotics and her erythema has improved. X-rays were negative for osteomyelitis. The patient will keep her current space for angiogram this coming Tuesday to evaluate her blood flow. This was communicated with the patient. From my perspective she could be discharged to home and followup as an outpatient on Tuesday for arteriography.  Durene Cal

## 2013-07-05 NOTE — Consult Note (Signed)
CARDIOLOGY CONSULT NOTE  Patient ID: [name] MRN: [id number], DOB/AGE: [birth year]   Admit date: 07/03/2013 Date of Consult: 07/05/2013  Primary Physician: Wendall Papa Primary Cardiologist: Ival Bible, MD   Pt. Profile  [name] is an 79 y.o. female with hx of multivessel CAD s/p 4v CABG (1997) and known occlusion of the SVG to OM system following NSTEMI in 04/03/12, prior CVA, HTN, HL, gout, DM2, and known PVD followed by Dr Myra Gianotti. She is scheduled for angiography on July 15th for non-healing right foot ulcer.  Problem List  Past Medical History  Diagnosis Date  . Essential hypertension, benign   . Gout   . Ischemic cardiomyopathy   . Systolic and diastolic CHF, chronic     Echo 04/05/12 EF 40-45%, anterolateral/posterior hypokinesis, grade 1 diastolic dysfunction, mild MR, mildly dilated LA  . Hyperlipidemia   . Coronary atherosclerosis of native coronary artery     s/p 4V CABG '97; NSTEMI -->Cardiac cath 04/03/12 revealed a new occlusion of SVG to OM system, Medical therapy (asa, plavix, statin, ranexa).  . Diabetes mellitus, type 2   . Seizures   . Arthritis   . Osteoporosis   . Retinal hemorrhage, both eyes   . Stroke     1980 and then 2 strokes 2012  . GERD (gastroesophageal reflux disease)     Past Surgical History  Procedure Laterality Date  . Coronary artery bypass graft      1997  . Knee surgery      bilateral  . Abdominal hysterectomy    . Cardiac catheterization      11/2010-patent grafts  . Right foot surgery      big toe removed and other surgeries on rt foot because of infection  . Left hip hemi-arthroplasty  07-31-2011    SURGERY AT [facility] FOR FRACTURE LEFT FEMORAL NECK  . Total hip revision  01/23/2012    Procedure: TOTAL HIP REVISION;  Surgeon: Shelda Pal, MD;  Location: [facility];  Service: Orthopedics;  Laterality: Left;  Conversion of  Previous Surgery to a Left Total Hip  . Abdominal hysterectomy    . Joint replacement  Jan.  2013    Left Hip    Allergies  Allergies  Allergen Reactions  . Ace Inhibitors     Hyperkalemia  . Cephalexin     Reaction unknown-but on pt's list of medications that she was told she was very allergic to.  . Furosemide Other (See Comments)    presyncope  . Levofloxacin Swelling  . Bactrim Rash  . Ciprocin-Fluocin-Procin (Fluocinolone Acetonide) Rash  . Ciprofloxacin Rash  . Codeine Rash  . Morphine And Related Rash  . Pentazocine Rash  . Promethazine Swelling and Rash  . Sulfa Antibiotics Swelling and Rash  . Trimethoprim Swelling and Rash   HPI   [name] is an 79 y.o. female with hx of multivessel CAD s/p 4v CABG (1997) and known occlusion of the SVG to OM system following NSTEMI in 04/03/12, prior CVA, HTN, HL, DM2, and known PVD followed by Dr Myra Gianotti. She is scheduled for angiography on July 15th for non-healing right foot ulcer and presented to the ED yesterday 2/2 cellulitis.  Since NSTEMI in April 2013 she has experienced only 2 episodes of chest pain, the most recent occuring 5+ months ago. During these episodes, she experiences chest pressure that is 8-9/10 in severity and accompanied by SOB; no diaphoresis or n/v. She immediately takes SL nitro, 3 in total,  one every 5 minutes. After the third nitro her chest pain completely subsides.   Outside of the two chest pain episodes within the past year, she occasionally (less than once/month) experiences palpitations which only last a few seconds are normally occur before bed. Palpitations are unaccompanied by SOB, chest pain, or other symptoms. She denies SOB at rest but does experience DOE. Lives alone and uses walker for ambulation since 2012. Walks from room to room in house and sometimes to dumpster to take trash out without problems. Visited family with 2 flights of stairs on Mother's Day and Christmas; while ascending stairs she experiences SOB but denies chest pain or palpitations. Needs help showering;  family/friends occasionally visit to assist. Gets "Meals on Wheels".   Echo 04/05/12 EF 40-45%, anterolateral/posterior hypokinesis, grade 1 diastolic dysfunction, mild MR, mildly dilated LA. Echo results from this morning pending.   Stable RBB on ECG per [place] on 06/07/13.  Inpatient Medications  . acetaminophen  500 mg Oral QHS   And  . diphenhydrAMINE  25 mg Oral QHS  . allopurinol  300 mg Oral q morning - 10a  . aspirin EC  81 mg Oral QHS  . atorvastatin  80 mg Oral QHS  . aztreonam  1 g Intravenous Q8H  . carvedilol  6.25 mg Oral BID WC  . cholecalciferol  1,000 Units Oral q morning - 10a  . clindamycin (CLEOCIN) IV  600 mg Intravenous Q8H  . clopidogrel  75 mg Oral q morning - 10a  . ferrous sulfate  325 mg Oral BID WC  . glyBURIDE  10 mg Oral BID WC  . insulin aspart  0-15 Units Subcutaneous TID WC  . insulin aspart  0-5 Units Subcutaneous QHS  . insulin glargine  25 Units Subcutaneous Daily  . isosorbide mononitrate  30 mg Oral q morning - 10a  . lamoTRIgine  100 mg Oral BID  . pantoprazole  40 mg Oral Daily  . torsemide  20 mg Oral q morning - 10a   Family History Family History  Problem Relation Age of Onset  . Heart attack Father 4  . Heart disease Father     Social History History   Social History  . Marital Status: Widowed    Spouse Name: N/A    Number of Children: N/A  . Years of Education: N/A   Occupational History  . Not on file.   Social History Main Topics  . Smoking status: Never Smoker   . Smokeless tobacco: Never Used  . Alcohol Use: No  . Drug Use: No  . Sexually Active: Not Currently    Birth Control/ Protection: Post-menopausal   Other Topics Concern  . Not on file   Social History Narrative  . No narrative on file    Review of Systems  General:  No chills, fever, night sweats or weight changes.  Cardiovascular:  +++ occasional chest pain, dyspnea on exertion, palpitations. No edema, orthopnea. Dermatological: +++ right foot  ulcer. Respiratory: No cough. Abdominal:   No nausea, vomiting. Neurologic:  No visual changes, wkns, changes in mental status. All other systems reviewed and are otherwise negative except as noted above.  Physical Exam  Blood pressure 118/90, pulse 71, temperature 97.6 F (36.4 C), temperature source Oral, resp. rate 18, height 5\' 2"  (1.575 m), weight 157 lb 8.3 oz (71.45 kg), SpO2 100.00%.  General: Pleasant, talkative elderly woman in NAD. Psych: Normal affect. Neuro: Alert and oriented X 3. Moves all extremities spontaneously. HEENT: Normal  Neck: Supple without bruits or JVD. Lungs:  Resp regular and unlabored, CTAB. Heart: RRR. S1 quiet. No s3, s4, or murmurs. Abdomen: Soft, non-tender, non-distended, BS + x 4.  Extremities: No cyanosis. R foot with mild edema dt cellulitis. PT/Radials 1+ and equal bilaterally.  Labs   Lab Results  Component Value Date   WBC 9.9 07/04/2013   HGB 13.2 07/04/2013   HCT 38.3 07/04/2013   MCV 91.0 07/04/2013   PLT 240 07/04/2013     Recent Labs Lab 07/05/13 0605  NA 138  K 3.8  CL 101  CO2 25  BUN 38*  CREATININE 1.15*  CALCIUM 9.1  PROT 6.9  BILITOT 0.3  ALKPHOS 81  ALT 9  AST 14  GLUCOSE 93   Lab Results  Component Value Date   CHOL 227* 04/03/2012   HDL 48 04/03/2012   LDLCALC 469* 04/03/2012   TRIG 268* 04/03/2012   Radiology/Studies  Dg Foot Complete Right  2013-07-05   *RADIOLOGY REPORT*  Clinical Data: Wound infection.  Diabetic ulcer.  Redness, swelling.  RIGHT FOOT COMPLETE - 3+ VIEW  Comparison: 06/13/2013  Findings: Prior right first transmetatarsal amputation. Amputation of the distal phalanx and distal portion of the middle phalanx of the second toe.  Soft tissue defect noted over the fifth MTP joint, similar to prior study.  No underlying acute bony abnormality.  No convincing evidence for osteomyelitis.  IMPRESSION: Stable chronic findings.  No acute process.   Original Report Authenticated By: Charlett Nose, M.D.    ECG  Pending  ASSESSMENT AND PLAN  1. CAD: Since NSTEMI in April 2013 she has experienced only 2 episodes of chest pain, the most recent occuring 5+ months ago.  Activity is fairly limited @ home.  She is at least moderate risk for a vascular surgical intervention however additional ischemic testing is unlikely to change that risk.  She was last cathed in 03/2012.  Rec proceed to surgery if needed and continue bb and statin throughout perioperative period.  Resume asa when appropriate.  Hold plavix as she is not > 1 yr post nstemi and did not have a stent placed.  We will also consider permanent discontinuation of plavix.   2.  LE cellulitis/PVD:  See above re: possible surgery.  3. Chronic systolic chf/ICM: Patient is stable and does not appear to be volume overloaded. Continue carvedilol and torsemide as prescribed.   4. HTN: well-controlled. Continue carvedilol and torsemide as prescribed. Vitals Q4 hours with floor schedule.   5. Hyperlipidemia: continue atorvastatin as prescribed.   6. DM2: Continue insulin regimen as prescribed. Monitor blood glucose levels....  Signed, Nicolasa Ducking, NP 07/05/2013, 11:19 AM Patient seen .  History reviewed. EKG not yet done. Physical exam reveals elderly woman in no distress. Lungs clear. Heart reveals no gallop. She gives no history of any recent chest pain. Agree with assessment and plan.  Proceed with angiogram and subsequent surgery as planned.

## 2013-07-05 NOTE — Progress Notes (Signed)
  Echocardiogram 2D Echocardiogram has been performed.  Georgian Co 07/05/2013, 12:13 PM

## 2013-07-05 NOTE — Progress Notes (Signed)
[facility] PROGRESS NOTE  [name] ZOX:[id number] DOB: 1931/09/08 DOA: 07/03/2013 PCP: Orvilla Cornwall C  Assessment/Plan: Principal Problem:   Cellulitis and abscess of foot Active Problems:   Coronary atherosclerosis of native coronary artery   Hyperlipidemia   Essential hypertension, benign   Peripheral arterial occlusive disease   DM2 (diabetes mellitus, type 2)      Cellulitis  Started the patient on vancomycin and Zosyn  Reaction to vancomycin yesterday  Change to Azactam and clindamycin  Per Vascular surgery  angiogram on July 15th with Dr. Myra Gianotti pending complete resolution of cellulitis Complicated cardiac history Will obtain a 2-D echo [facility]  cardiology for clearance before surgery Cardiology please specify if plavix can be held before surgery      Diabetes continue bloody right, increased Lantus, sliding scale insulin  Hypertension continue him down, Demadex   Peripheral vascular disease continue Plavix , may need to hold the surgery anticipated   Code Status: full  Family Communication: family updated about patient's clinical progress  Disposition Plan: As above   Brief narrative:  [name] is an 79 y.o. female with hx of HTN, gout, DM2, and CAD s/p CABG, prior CVA with known PVD followed by Dr Linnell Fulling, scheduled for aortogram next week, presents to the ER with increase redness of her right foot. She has been at wound clinic for her ulcer on the lateral aspect of her foot. She had prior toe amputation also. Evaluation in the ER showed Cr of 1.22, BS of 237, with no leukocytosis. Hospitalist was asked to admit her for cellulitis  Consultants:  Vascular surgery Procedures:  None Antibiotics:  Azactam and clindamycin HPI/Subjective:  No complaints   Objective: Filed Vitals:   07/04/13 1300 07/04/13 1732 07/04/13 2041 07/05/13 0516  BP: 155/60 131/90 127/45 134/62  Pulse: 88 85 80 77  Temp: 97.6 F (36.4 C) 98.3 F  (36.8 C) 97.9 F (36.6 C) 97.5 F (36.4 C)  TempSrc: Oral Oral Oral Oral  Resp: 18 18 20 18   Height:   5\' 2"  (1.575 m)   Weight:   71.45 kg (157 lb 8.3 oz)   SpO2: 100% 98% 95% 98%    Intake/Output Summary (Last 24 hours) at 07/05/13 0851 Last data filed at 07/04/13 0900  Gross per 24 hour  Intake    240 ml  Output      0 ml  Net    240 ml    Exam:  HENT:  Head: Atraumatic.  Nose: Nose normal.  Mouth/Throat: Oropharynx is clear and moist.  Eyes: Conjunctivae are normal. Pupils are equal, round, and reactive to light. No scleral icterus.  Neck: Neck supple. No tracheal deviation present.  Cardiovascular: Normal rate, regular rhythm, normal heart sounds and intact distal pulses. of Pulmonary/Chest: Effort normal and breath sounds normal. No respiratory distress.  Abdominal: Soft. Normal appearance and bowel sounds are normal. She exhibits no distension. There is no tenderness.  Extremity; 2 cm ulcer at the base of the right fifth toe which tracks down to the bone. There is surrounding erythema . Neurological: She is alert. No cranial nerve deficit.    Data Reviewed: Basic Metabolic Panel:  Recent Labs Lab 07/04/13 0048 07/05/13 0605  NA 132* 138  K 4.1 3.8  CL 94* 101  CO2 27 25  GLUCOSE 237* 93  BUN 40* 38*  CREATININE 1.22* 1.15*  CALCIUM 9.8 9.1    Liver Function Tests:  Recent Labs Lab 07/05/13 0605  AST 14  ALT 9  ALKPHOS 81  BILITOT 0.3  PROT 6.9  ALBUMIN 3.0*   No results found for this basename: LIPASE, AMYLASE,  in the last 168 hours No results found for this basename: AMMONIA,  in the last 168 hours  CBC:  Recent Labs Lab 07/04/13 0048  WBC 9.9  NEUTROABS 5.6  HGB 13.2  HCT 38.3  MCV 91.0  PLT 240    Cardiac Enzymes: No results found for this basename: CKTOTAL, CKMB, CKMBINDEX, TROPONINI,  in the last 168 hours BNP (last 3 results) No results found for this basename: PROBNP,  in the last 8760 hours   CBG:  Recent Labs Lab  07/04/13 0800 07/04/13 1151 07/04/13 1618 07/04/13 2039 07/05/13 0740  GLUCAP 173* 268* 242* 267* 119*    Recent Results (from the past 240 hour(s))  CULTURE, BLOOD (ROUTINE X 2)     Status: None   Collection Time    07/04/13  1:00 AM      Result Value Range Status   Specimen Description BLOOD ARM RIGHT   Final   Special Requests BOTTLES DRAWN AEROBIC AND ANAEROBIC 10CC   Final   Culture  Setup Time 07/04/2013 11:54   Final   Culture     Final   Value:        BLOOD CULTURE RECEIVED NO GROWTH TO DATE CULTURE WILL BE HELD FOR 5 DAYS BEFORE ISSUING A FINAL NEGATIVE REPORT   Report Status PENDING   Incomplete  CULTURE, BLOOD (ROUTINE X 2)     Status: None   Collection Time    07/04/13  1:10 AM      Result Value Range Status   Specimen Description BLOOD ARM LEFT   Final   Special Requests BOTTLES DRAWN AEROBIC ONLY 10CC   Final   Culture  Setup Time 07/04/2013 11:56   Final   Culture     Final   Value:        BLOOD CULTURE RECEIVED NO GROWTH TO DATE CULTURE WILL BE HELD FOR 5 DAYS BEFORE ISSUING A FINAL NEGATIVE REPORT   Report Status PENDING   Incomplete     Studies: Dg Foot Complete Right  08-02-2013   *RADIOLOGY REPORT*  Clinical Data: Wound infection.  Diabetic ulcer.  Redness, swelling.  RIGHT FOOT COMPLETE - 3+ VIEW  Comparison: 06/13/2013  Findings: Prior right first transmetatarsal amputation. Amputation of the distal phalanx and distal portion of the middle phalanx of the second toe.  Soft tissue defect noted over the fifth MTP joint, similar to prior study.  No underlying acute bony abnormality.  No convincing evidence for osteomyelitis.  IMPRESSION: Stable chronic findings.  No acute process.   Original Report Authenticated By: Charlett Nose, M.D.    Scheduled Meds: . acetaminophen  500 mg Oral QHS   And  . diphenhydrAMINE  25 mg Oral QHS  . allopurinol  300 mg Oral q morning - 10a  . aspirin EC  81 mg Oral QHS  . atorvastatin  80 mg Oral QHS  . aztreonam  1 g  Intravenous Q8H  . carvedilol  6.25 mg Oral BID WC  . cholecalciferol  1,000 Units Oral q morning - 10a  . clindamycin (CLEOCIN) IV  600 mg Intravenous Q8H  . clopidogrel  75 mg Oral q morning - 10a  . ferrous sulfate  325 mg Oral BID WC  . glyBURIDE  10 mg Oral BID WC  . insulin aspart  0-15 Units Subcutaneous TID WC  .  insulin aspart  0-5 Units Subcutaneous QHS  . insulin glargine  18 Units Subcutaneous Daily  . isosorbide mononitrate  30 mg Oral q morning - 10a  . lamoTRIgine  100 mg Oral BID  . pantoprazole  40 mg Oral Daily  . torsemide  20 mg Oral q morning - 10a   Continuous Infusions:   Principal Problem:   Cellulitis and abscess of foot Active Problems:   Coronary atherosclerosis of native coronary artery   Hyperlipidemia   Essential hypertension, benign   Peripheral arterial occlusive disease   DM2 (diabetes mellitus, type 2)    Time spent: 40 minutes   [facility]  [facility] Pager [phone]. If 8PM-8AM, please contact night-coverage at [email or web address], password [facility] 07/05/2013, 8:51 AM  LOS: 2 days

## 2013-07-06 DIAGNOSIS — L02619 Cutaneous abscess of unspecified foot: Secondary | ICD-10-CM

## 2013-07-06 DIAGNOSIS — L03119 Cellulitis of unspecified part of limb: Secondary | ICD-10-CM

## 2013-07-06 LAB — GLUCOSE, CAPILLARY: Glucose-Capillary: 176 mg/dL — ABNORMAL HIGH (ref 70–99)

## 2013-07-06 LAB — BASIC METABOLIC PANEL
CO2: 28 mEq/L (ref 19–32)
Calcium: 9.5 mg/dL (ref 8.4–10.5)
Creatinine, Ser: 1.36 mg/dL — ABNORMAL HIGH (ref 0.50–1.10)
Glucose, Bld: 120 mg/dL — ABNORMAL HIGH (ref 70–99)

## 2013-07-06 LAB — CBC
HCT: 36.4 % (ref 36.0–46.0)
Hemoglobin: 12.2 g/dL (ref 12.0–15.0)
MCH: 30.4 pg (ref 26.0–34.0)
MCV: 90.8 fL (ref 78.0–100.0)
RBC: 4.01 MIL/uL (ref 3.87–5.11)

## 2013-07-06 MED ORDER — SODIUM CHLORIDE 0.9 % IV SOLN
INTRAVENOUS | Status: DC
  Administered 2013-07-06: 50 mL/h via INTRAVENOUS
  Administered 2013-07-08 – 2013-07-09 (×2): via INTRAVENOUS

## 2013-07-06 NOTE — Progress Notes (Signed)
PROGRESS NOTE  Subjective:   [name] is an 79 y.o. female with hx of multivessel CAD s/p 4v CABG (1997) and known occlusion of the SVG to OM system following NSTEMI in 04/03/12, prior CVA, HTN, HL, gout, DM2, and known PVD followed by Dr Myra Gianotti. She is scheduled for angiography on July 15th for non-healing right foot ulcer.  She has not had any significant CP recently.    Echo last night shows normal LV systolic  Function.  She is feeling well this am.    Objective:    Vital Signs:   Temp:  [97.4 F (36.3 C)-98 F (36.7 C)] 97.9 F (36.6 C) (07/12 0458) Pulse Rate:  [71-85] 85 (07/12 0458) Resp:  [18-20] 18 (07/12 0458) BP: (118-149)/(69-90) 137/69 mmHg (07/12 0458) SpO2:  [94 %-100 %] 96 % (07/12 0458) Weight:  [159 lb 13.3 oz (72.5 kg)] 159 lb 13.3 oz (72.5 kg) (07/11 2103)  Last BM Date: 07/03/13   24-hour weight change: Weight change: 2 lb 5 oz (1.05 kg)  Weight trends: Filed Weights   07/04/13 0331 07/04/13 2041 07/05/13 2103  Weight: 157 lb 1 oz (71.243 kg) 157 lb 8.3 oz (71.45 kg) 159 lb 13.3 oz (72.5 kg)    Intake/Output:  07/11 0701 - 07/12 0700 In: 720 [P.O.:720] Out: -      Physical Exam: BP 137/69  Pulse 85  Temp(Src) 97.9 F (36.6 C) (Oral)  Resp 18  Ht 5\' 2"  (1.575 m)  Wt 159 lb 13.3 oz (72.5 kg)  BMI 29.23 kg/m2  SpO2 96%  General: Vital signs reviewed and noted.   Head: Normocephalic, atraumatic.  Eyes: conjunctivae/corneas clear.  EOM's intact.   Throat: normal  Neck:  normal  Lungs:  clear  Heart:  RR, normal S1, S2  Abdomen:  Soft, non-tender, non-distended    Extremities: Non healing ulcer in the lateral aspect of her right foot.   No edema   Neurologic: A&O X3, CN II - XII are grossly intact.   Psych: Normal     Labs: BMET:  Recent Labs  07/04/13 0048 07/05/13 0605  NA 132* 138  K 4.1 3.8  CL 94* 101  CO2 27 25  GLUCOSE 237* 93  BUN 40* 38*  CREATININE 1.22* 1.15*  CALCIUM 9.8 9.1    Liver function  tests:  Recent Labs  07/05/13 0605  AST 14  ALT 9  ALKPHOS 81  BILITOT 0.3  PROT 6.9  ALBUMIN 3.0*   No results found for this basename: LIPASE, AMYLASE,  in the last 72 hours  CBC:  Recent Labs  07/04/13 0048  WBC 9.9  NEUTROABS 5.6  HGB 13.2  HCT 38.3  MCV 91.0  PLT 240    Cardiac Enzymes: No results found for this basename: CKTOTAL, CKMB, TROPONINI,  in the last 72 hours  Coagulation Studies: No results found for this basename: LABPROT, INR,  in the last 72 hours  Other:   Other results:  Tele:  NSR  Medications:    Infusions: . sodium chloride      Scheduled Medications: . acetaminophen  500 mg Oral QHS   And  . diphenhydrAMINE  25 mg Oral QHS  . allopurinol  300 mg Oral q morning - 10a  . aspirin EC  81 mg Oral QHS  . atorvastatin  80 mg Oral QHS  . aztreonam  1 g Intravenous Q8H  . carvedilol  6.25 mg Oral BID WC  . cholecalciferol  1,000 Units  Oral q morning - 10a  . clindamycin (CLEOCIN) IV  600 mg Intravenous Q8H  . ferrous sulfate  325 mg Oral BID WC  . glyBURIDE  10 mg Oral BID WC  . heparin subcutaneous  5,000 Units Subcutaneous Q8H  . insulin aspart  0-15 Units Subcutaneous TID WC  . insulin aspart  0-5 Units Subcutaneous QHS  . insulin glargine  25 Units Subcutaneous Daily  . isosorbide mononitrate  30 mg Oral q morning - 10a  . lamoTRIgine  100 mg Oral BID  . pantoprazole  40 mg Oral Daily  . torsemide  20 mg Oral q morning - 10a    Assessment/ Plan:   Principal Problem:   Cellulitis and abscess of foot - for angiogram on Tuesday.     Active Problems:   Coronary atherosclerosis of native coronary artery- she has been stable.  Only 2 episodes of CP over the past year.  Agree that she is at moderate risk for a vascular surgery but she is as stable as we will be able to make her.  No further recs at this time.     Hyperlipidemia   Essential hypertension, benign   Peripheral arterial occlusive disease   DM2 (diabetes mellitus,  type 2)   Disposition:  Length of Stay: 3  [name], Montez Hageman., MD, [facility] 07/06/2013, 7:56 AM Office [phone] Pager [phone]

## 2013-07-06 NOTE — Progress Notes (Addendum)
[facility] PROGRESS NOTE  [name] UUV:[id number] DOB: 09-Jun-1931 DOA: 07/03/2013 PCP: Orvilla Cornwall C  Assessment/Plan: Principal Problem:   Cellulitis and abscess of foot Active Problems:   Coronary atherosclerosis of native coronary artery   Hyperlipidemia   Essential hypertension, benign   Peripheral arterial occlusive disease   DM2 (diabetes mellitus, type 2)   Cellulitis  Started the patient on vancomycin and Zosyn  Reaction to vancomycin yesterday  Change to Azactam and clindamycin  Per Vascular surgery angiogram on July 15th with Dr. Myra Gianotti pending complete resolution of cellulitis  Complicated cardiac history  2-D echo shows EF of 55-65% Called [place] cardiology , seen by cardiology, holding Plavix According to cardiology moderate risk for surgery   Acute on chronic renal insufficiency stage II Baseline 1.17 Decreased preprocedure IV hydration normal saline that was ordered by vascular surgery to 50 cc per hour Discontinue Demadex and the patient was getting IV fluid and diuretics at the same time   Diabetes continue Accu-Cheks, increased Lantus, sliding scale insulin   Hypertension holding Demadex  Peripheral vascular disease -discontinue Plavix  Code Status: full  Family Communication: family updated about patient's clinical progress  Disposition Plan: As above   Brief narrative:  [name] is an 79 y.o. female with hx of HTN, gout, DM2, and CAD s/p CABG, prior CVA with known PVD followed by Dr Linnell Fulling, scheduled for aortogram next week, presents to the ER with increase redness of her right foot. She has been at wound clinic for her ulcer on the lateral aspect of her foot. She had prior toe amputation also. Evaluation in the ER showed Cr of 1.22, BS of 237, with no leukocytosis. Hospitalist was asked to admit her for cellulitis  Consultants:  Vascular surgery Procedures:  None Antibiotics:  Azactam and clindamycin HPI/Subjective:   No complaints Stable overnight  Objective: Filed Vitals:   07/05/13 1744 07/05/13 2103 07/06/13 0458 07/06/13 0947  BP: 130/71 149/69 137/69 131/51  Pulse: 80 82 85 74  Temp: 97.4 F (36.3 C) 98 F (36.7 C) 97.9 F (36.6 C) 98.2 F (36.8 C)  TempSrc: Oral Oral Oral Oral  Resp: 18 20 18 18   Height:  5\' 2"  (1.575 m)    Weight:  72.5 kg (159 lb 13.3 oz)    SpO2: 100% 94% 96% 98%    Intake/Output Summary (Last 24 hours) at 07/06/13 1043 Last data filed at 07/06/13 0900  Gross per 24 hour  Intake    720 ml  Output      0 ml  Net    720 ml    Exam:  HENT:  Head: Atraumatic.  Nose: Nose normal.  Mouth/Throat: Oropharynx is clear and moist.  Eyes: Conjunctivae are normal. Pupils are equal, round, and reactive to light. No scleral icterus.  Neck: Neck supple. No tracheal deviation present.  Cardiovascular: Normal rate, regular rhythm, normal heart sounds and intact distal pulses.  Pulmonary/Chest: Effort normal and breath sounds normal. No respiratory distress.  Abdominal: Soft. Normal appearance and bowel sounds are normal. She exhibits no distension. There is no tenderness.  Musculoskeletal: She exhibits no edema and no tenderness.  Neurological: She is alert. No cranial nerve deficit.    Data Reviewed: Basic Metabolic Panel:  Recent Labs Lab 07/04/13 0048 07/05/13 0605 07/06/13 0710  NA 132* 138 138  K 4.1 3.8 4.4  CL 94* 101 99  CO2 27 25 28   GLUCOSE 237* 93 120*  BUN 40* 38* 41*  CREATININE 1.22*  1.15* 1.36*  CALCIUM 9.8 9.1 9.5    Liver Function Tests:  Recent Labs Lab 07/05/13 0605  AST 14  ALT 9  ALKPHOS 81  BILITOT 0.3  PROT 6.9  ALBUMIN 3.0*   No results found for this basename: LIPASE, AMYLASE,  in the last 168 hours No results found for this basename: AMMONIA,  in the last 168 hours  CBC:  Recent Labs Lab 07/04/13 0048 07/06/13 0710  WBC 9.9 6.8  NEUTROABS 5.6  --   HGB 13.2 12.2  HCT 38.3 36.4  MCV 91.0 90.8  PLT 240 225     Cardiac Enzymes: No results found for this basename: CKTOTAL, CKMB, CKMBINDEX, TROPONINI,  in the last 168 hours BNP (last 3 results) No results found for this basename: PROBNP,  in the last 8760 hours   CBG:  Recent Labs Lab 07/05/13 0740 07/05/13 1225 07/05/13 1650 07/05/13 2255 07/06/13 0732  GLUCAP 119* 193* 272* 263* 122*    Recent Results (from the past 240 hour(s))  CULTURE, BLOOD (ROUTINE X 2)     Status: None   Collection Time    07/04/13  1:00 AM      Result Value Range Status   Specimen Description BLOOD ARM RIGHT   Final   Special Requests BOTTLES DRAWN AEROBIC AND ANAEROBIC 10CC   Final   Culture  Setup Time 07/04/2013 11:54   Final   Culture     Final   Value:        BLOOD CULTURE RECEIVED NO GROWTH TO DATE CULTURE WILL BE HELD FOR 5 DAYS BEFORE ISSUING A FINAL NEGATIVE REPORT   Report Status PENDING   Incomplete  CULTURE, BLOOD (ROUTINE X 2)     Status: None   Collection Time    07/04/13  1:10 AM      Result Value Range Status   Specimen Description BLOOD ARM LEFT   Final   Special Requests BOTTLES DRAWN AEROBIC ONLY 10CC   Final   Culture  Setup Time 07/04/2013 11:56   Final   Culture     Final   Value:        BLOOD CULTURE RECEIVED NO GROWTH TO DATE CULTURE WILL BE HELD FOR 5 DAYS BEFORE ISSUING A FINAL NEGATIVE REPORT   Report Status PENDING   Incomplete     Studies: Dg Foot Complete Right  07-07-2013   *RADIOLOGY REPORT*  Clinical Data: Wound infection.  Diabetic ulcer.  Redness, swelling.  RIGHT FOOT COMPLETE - 3+ VIEW  Comparison: 06/13/2013  Findings: Prior right first transmetatarsal amputation. Amputation of the distal phalanx and distal portion of the middle phalanx of the second toe.  Soft tissue defect noted over the fifth MTP joint, similar to prior study.  No underlying acute bony abnormality.  No convincing evidence for osteomyelitis.  IMPRESSION: Stable chronic findings.  No acute process.   Original Report Authenticated By: Charlett Nose,  M.D.    Scheduled Meds: . acetaminophen  500 mg Oral QHS   And  . diphenhydrAMINE  25 mg Oral QHS  . allopurinol  300 mg Oral q morning - 10a  . aspirin EC  81 mg Oral QHS  . atorvastatin  80 mg Oral QHS  . aztreonam  1 g Intravenous Q8H  . carvedilol  6.25 mg Oral BID WC  . cholecalciferol  1,000 Units Oral q morning - 10a  . clindamycin (CLEOCIN) IV  600 mg Intravenous Q8H  . ferrous sulfate  325 mg Oral BID  WC  . glyBURIDE  10 mg Oral BID WC  . heparin subcutaneous  5,000 Units Subcutaneous Q8H  . insulin aspart  0-15 Units Subcutaneous TID WC  . insulin aspart  0-5 Units Subcutaneous QHS  . insulin glargine  25 Units Subcutaneous Daily  . isosorbide mononitrate  30 mg Oral q morning - 10a  . lamoTRIgine  100 mg Oral BID  . pantoprazole  40 mg Oral Daily   Continuous Infusions: . sodium chloride      Principal Problem:   Cellulitis and abscess of foot Active Problems:   Coronary atherosclerosis of native coronary artery   Hyperlipidemia   Essential hypertension, benign   Peripheral arterial occlusive disease   DM2 (diabetes mellitus, type 2)    Time spent: 40 minutes   [facility]  [facility] Pager [phone]. If 8PM-8AM, please contact night-coverage at [email or web address], password [facility] 07/06/2013, 10:43 AM  LOS: 3 days

## 2013-07-07 LAB — GLUCOSE, CAPILLARY
Glucose-Capillary: 236 mg/dL — ABNORMAL HIGH (ref 70–99)
Glucose-Capillary: 72 mg/dL (ref 70–99)

## 2013-07-07 MED ORDER — INSULIN GLARGINE 100 UNIT/ML ~~LOC~~ SOLN
32.0000 [IU] | Freq: Every day | SUBCUTANEOUS | Status: DC
  Administered 2013-07-07 – 2013-07-08 (×2): 32 [IU] via SUBCUTANEOUS
  Filled 2013-07-07 (×2): qty 0.32

## 2013-07-07 NOTE — Progress Notes (Signed)
[facility] PROGRESS NOTE  [name] BJY:[id number] DOB: 1931-04-10 DOA: 07/03/2013 PCP: Orvilla Cornwall C  Assessment/Plan: Principal Problem:   Cellulitis and abscess of foot Active Problems:   Coronary atherosclerosis of native coronary artery   Hyperlipidemia   Essential hypertension, benign   Peripheral arterial occlusive disease   DM2 (diabetes mellitus, type 2)   Cellulitis  Started the patient on vancomycin and Zosyn  Reaction to vancomycin Change to Azactam and clindamycin  Per Vascular surgery angiogram on July 15th with Dr. Myra Gianotti pending complete resolution of cellulitis  Complicated cardiac history  2-D echo shows EF of 55-65%  Called [facility] , seen by cardiology, holding Plavix  According to cardiology moderate risk for surgery    Acute on chronic renal insufficiency stage II  Baseline 1.17  Decreased preprocedure IV hydration normal saline that was ordered by vascular surgery to 50 cc per hour  Discontinue Demadex and the patient was getting IV fluid and diuretics at the same time    Diabetes continue Accu-Cheks, increased Lantus, sliding scale insulin    Hypertension holding Demadex   Peripheral vascular disease -discontinue Plavix   Code Status: full  Family Communication: family updated about patient's clinical progress  Disposition Plan: As above   Brief narrative:  [name] is an 79 y.o. female with hx of HTN, gout, DM2, and CAD s/p CABG, prior CVA with known PVD followed by Dr Linnell Fulling, scheduled for aortogram next week, presents to the ER with increase redness of her right foot. She has been at wound clinic for her ulcer on the lateral aspect of her foot. She had prior toe amputation also. Evaluation in the ER showed Cr of 1.22, BS of 237, with no leukocytosis. Hospitalist was asked to admit her for cellulitis  Consultants:  Vascular surgery Procedures:  None Antibiotics:  Azactam and clindamycin HPI/Subjective:   No complaints   Objective: Filed Vitals:   07/06/13 0947 07/06/13 1800 07/06/13 2128 07/07/13 0455  BP: 131/51 128/65 129/63 127/62  Pulse: 74 81 82 74  Temp: 98.2 F (36.8 C) 98 F (36.7 C) 97.7 F (36.5 C) 97.9 F (36.6 C)  TempSrc: Oral Oral Oral Oral  Resp: 18 18 18 18   Height:   5\' 2"  (1.575 m)   Weight:   71.5 kg (157 lb 10.1 oz)   SpO2: 98% 96% 92% 94%    Intake/Output Summary (Last 24 hours) at 07/07/13 1038 Last data filed at 07/07/13 0514  Gross per 24 hour  Intake 1942.17 ml  Output      0 ml  Net 1942.17 ml    Exam:  HENT:  Head: Atraumatic.  Nose: Nose normal.  Mouth/Throat: Oropharynx is clear and moist.  Eyes: Conjunctivae are normal. Pupils are equal, round, and reactive to light. No scleral icterus.  Neck: Neck supple. No tracheal deviation present.  Cardiovascular: Normal rate, regular rhythm, normal heart sounds and intact distal pulses.  Pulmonary/Chest: Effort normal and breath sounds normal. No respiratory distress.  Abdominal: Soft. Normal appearance and bowel sounds are normal. She exhibits no distension. There is no tenderness.  Musculoskeletal: She exhibits no edema and no tenderness.  Neurological: She is alert. No cranial nerve deficit.    Data Reviewed: Basic Metabolic Panel:  Recent Labs Lab 07/04/13 0048 07/05/13 0605 07/06/13 0710  NA 132* 138 138  K 4.1 3.8 4.4  CL 94* 101 99  CO2 27 25 28   GLUCOSE 237* 93 120*  BUN 40* 38* 41*  CREATININE  1.22* 1.15* 1.36*  CALCIUM 9.8 9.1 9.5    Liver Function Tests:  Recent Labs Lab 07/05/13 0605  AST 14  ALT 9  ALKPHOS 81  BILITOT 0.3  PROT 6.9  ALBUMIN 3.0*   No results found for this basename: LIPASE, AMYLASE,  in the last 168 hours No results found for this basename: AMMONIA,  in the last 168 hours  CBC:  Recent Labs Lab 07/04/13 0048 07/06/13 0710  WBC 9.9 6.8  NEUTROABS 5.6  --   HGB 13.2 12.2  HCT 38.3 36.4  MCV 91.0 90.8  PLT 240 225    Cardiac  Enzymes: No results found for this basename: CKTOTAL, CKMB, CKMBINDEX, TROPONINI,  in the last 168 hours BNP (last 3 results) No results found for this basename: PROBNP,  in the last 8760 hours   CBG:  Recent Labs Lab 07/05/13 2255 07/06/13 0732 07/06/13 1205 07/06/13 1611 07/06/13 2123  GLUCAP 263* 122* 249* 379* 176*    Recent Results (from the past 240 hour(s))  CULTURE, BLOOD (ROUTINE X 2)     Status: None   Collection Time    07/04/13  1:00 AM      Result Value Range Status   Specimen Description BLOOD ARM RIGHT   Final   Special Requests BOTTLES DRAWN AEROBIC AND ANAEROBIC 10CC   Final   Culture  Setup Time 07/04/2013 11:54   Final   Culture     Final   Value:        BLOOD CULTURE RECEIVED NO GROWTH TO DATE CULTURE WILL BE HELD FOR 5 DAYS BEFORE ISSUING A FINAL NEGATIVE REPORT   Report Status PENDING   Incomplete  CULTURE, BLOOD (ROUTINE X 2)     Status: None   Collection Time    07/04/13  1:10 AM      Result Value Range Status   Specimen Description BLOOD ARM LEFT   Final   Special Requests BOTTLES DRAWN AEROBIC ONLY 10CC   Final   Culture  Setup Time 07/04/2013 11:56   Final   Culture     Final   Value:        BLOOD CULTURE RECEIVED NO GROWTH TO DATE CULTURE WILL BE HELD FOR 5 DAYS BEFORE ISSUING A FINAL NEGATIVE REPORT   Report Status PENDING   Incomplete     Studies: Dg Foot Complete Right  07/11/2013   *RADIOLOGY REPORT*  Clinical Data: Wound infection.  Diabetic ulcer.  Redness, swelling.  RIGHT FOOT COMPLETE - 3+ VIEW  Comparison: 06/13/2013  Findings: Prior right first transmetatarsal amputation. Amputation of the distal phalanx and distal portion of the middle phalanx of the second toe.  Soft tissue defect noted over the fifth MTP joint, similar to prior study.  No underlying acute bony abnormality.  No convincing evidence for osteomyelitis.  IMPRESSION: Stable chronic findings.  No acute process.   Original Report Authenticated By: Charlett Nose, M.D.     Scheduled Meds: . acetaminophen  500 mg Oral QHS   And  . diphenhydrAMINE  25 mg Oral QHS  . allopurinol  300 mg Oral q morning - 10a  . aspirin EC  81 mg Oral QHS  . atorvastatin  80 mg Oral QHS  . aztreonam  1 g Intravenous Q8H  . carvedilol  6.25 mg Oral BID WC  . cholecalciferol  1,000 Units Oral q morning - 10a  . clindamycin (CLEOCIN) IV  600 mg Intravenous Q8H  . ferrous sulfate  325 mg Oral  BID WC  . glyBURIDE  10 mg Oral BID WC  . heparin subcutaneous  5,000 Units Subcutaneous Q8H  . insulin aspart  0-15 Units Subcutaneous TID WC  . insulin aspart  0-5 Units Subcutaneous QHS  . insulin glargine  25 Units Subcutaneous Daily  . isosorbide mononitrate  30 mg Oral q morning - 10a  . lamoTRIgine  100 mg Oral BID  . pantoprazole  40 mg Oral Daily   Continuous Infusions: . sodium chloride 50 mL/hr (07/06/13 1545)    Principal Problem:   Cellulitis and abscess of foot Active Problems:   Coronary atherosclerosis of native coronary artery   Hyperlipidemia   Essential hypertension, benign   Peripheral arterial occlusive disease   DM2 (diabetes mellitus, type 2)    Time spent: 40 minutes   [facility]  [facility] Pager [phone]. If 8PM-8AM, please contact night-coverage at [email or web address], password [facility] 07/07/2013, 10:38 AM  LOS: 4 days

## 2013-07-08 LAB — COMPREHENSIVE METABOLIC PANEL
Alkaline Phosphatase: 72 U/L (ref 39–117)
BUN: 37 mg/dL — ABNORMAL HIGH (ref 6–23)
Chloride: 101 mEq/L (ref 96–112)
GFR calc Af Amer: 51 mL/min — ABNORMAL LOW (ref >90)
Glucose, Bld: 57 mg/dL — ABNORMAL LOW (ref 70–99)
Potassium: 4.1 mEq/L (ref 3.5–5.1)
Sodium: 136 mEq/L (ref 135–145)
Total Bilirubin: 0.2 mg/dL — ABNORMAL LOW (ref 0.3–1.2)
Total Protein: 6.6 g/dL (ref 6.0–8.3)

## 2013-07-08 MED ORDER — INSULIN GLARGINE 100 UNIT/ML ~~LOC~~ SOLN
20.0000 [IU] | Freq: Every day | SUBCUTANEOUS | Status: DC
  Administered 2013-07-10 – 2013-07-12 (×3): 20 [IU] via SUBCUTANEOUS
  Filled 2013-07-08 (×4): qty 0.2

## 2013-07-08 MED ORDER — DEXTROSE 5 % IV SOLN
600.0000 mg | Freq: Three times a day (TID) | INTRAVENOUS | Status: DC
  Administered 2013-07-08 – 2013-07-11 (×8): 600 mg via INTRAVENOUS
  Filled 2013-07-08 (×12): qty 4

## 2013-07-08 NOTE — Progress Notes (Signed)
Plan for angiogram tomorrow   [name]

## 2013-07-08 NOTE — Progress Notes (Signed)
   CARE MANAGEMENT NOTE 07/08/2013  Patient:  [name], [name]   Account Number:  [id number]  Date Initiated:  07/08/2013  Documentation initiated by:  Darlyne Russian  Subjective/Objective Assessment:   Patient admitted with cellulitis right foot, seen at wound clinic weekly.  Resides at senior apartment complex.     Action/Plan:   Progression of care and discharge planning   Anticipated DC Date:     Anticipated DC Plan:           Choice offered to / List presented to:             Status of service:  In process, will continue to follow Medicare Important Message given?   (If response is "NO", the following Medicare IM given date fields will be blank) Date Medicare IM given:   Date Additional Medicare IM given:    Discharge Disposition:    Per UR Regulation:    If discussed at Long Length of Stay Meetings, dates discussed:    Comments:  07/08/2013 [address] RN, [place]  [phone]  Met with patient to discuss discharge planning. She lives alone in a senior apartment complex. Her family takes her to MD appointments. She had met one time  with [facility] program person and not interested in their program. She contacted a program in [place] for help with housekeeping, not available until December, ? called [facility].  Next year she plans to move to [place] with her son.  In the past she had [facility] after her hip surgery and would use again if need home health.

## 2013-07-08 NOTE — Progress Notes (Signed)
Inpatient Diabetes Program Recommendations  AACE/ADA: New Consensus Statement on Inpatient Glycemic Control (2013)  Target Ranges:  Prepandial:   less than 140 mg/dL      Peak postprandial:   less than 180 mg/dL (1-2 hours)      Critically ill patients:  140 - 180 mg/dL     Results for BENISHA, HADAWAY (MRN [id number]) as of 07/08/2013 11:34  Ref. Range 07/08/2013 07:45  Glucose-Capillary Latest Range: 70-99 mg/dL 65 (L)    Patient woke up with hypoglycemia this morning (CBG 65 mg/dl).  MD- Please reduce Lantus to 30 units daily.   Will follow. Ambrose Finland RN, MSN, CDE Diabetes Coordinator Inpatient Diabetes Program [phone]

## 2013-07-08 NOTE — Progress Notes (Signed)
Utilization review completed.  

## 2013-07-08 NOTE — Progress Notes (Addendum)
[facility] PROGRESS NOTE  [name] ZOX:[id number] DOB: [birth year] DOA: 07/03/2013 PCP: [facility] C  Assessment/Plan: Principal Problem:   Cellulitis and abscess of foot Active Problems:   Coronary atherosclerosis of native coronary artery   Hyperlipidemia   Essential hypertension, benign   Peripheral arterial occlusive disease   DM2 (diabetes mellitus, type 2)   Cellulitis  Started the patient on vancomycin and Zosyn  Reaction to vancomycin  Change to Azactam and clindamycin  Per Vascular surgery angiogram on July 15th with Dr. Myra Gianotti pending complete resolution of cellulitis  Complicated cardiac history  2-D echo shows EF of 55-65%  Called [facility] , seen by cardiology for cardiology clearance, holding Plavix  According to cardiology moderate risk for surgery     Acute on chronic renal insufficiency stage II  Baseline 1.17  Decreased preprocedure IV hydration normal saline that was ordered by vascular surgery to 50 cc per hour  Discontinue Demadex and the patient was getting IV fluid and diuretics at the same time    Diabetes continue Accu-Cheks, decrease Lantus for angiogram tomorrow, sliding scale insulin   Hypertension holding Demadex   Peripheral vascular disease -discontinue Plavix   Code Status: full  Family Communication: family updated about patient's clinical progress  Disposition Plan: Per vascular  Brief narrative:  [name] is an 79 y.o. female with hx of HTN, gout, DM2, and CAD s/p CABG, prior CVA with known PVD followed by Dr Linnell Fulling, scheduled for aortogram next week, presents to the ER with increase redness of her right foot. She has been at wound clinic for her ulcer on the lateral aspect of her foot. She had prior toe amputation also. Evaluation in the ER showed Cr of 1.22, BS of 237, with no leukocytosis. Hospitalist was asked to admit her for cellulitis  Started on Procrit on antibiotics, evaluated by vascular  surgery for angiogram tomorrow Consultants:  Vascular surgery Procedures:  None Antibiotics:  Azactam and clindamycin HPI/Subjective:  No complaints  Objective: Filed Vitals:   07/07/13 2049 07/08/13 0553 07/08/13 0554 07/08/13 0941  BP: 146/57 140/47 130/49 152/71  Pulse: 75 66  69  Temp: 97.6 F (36.4 C)   97.6 F (36.4 C)  TempSrc: Oral   Oral  Resp: 20   20  Height:  5\' 2"  (1.575 m)    Weight:  70.943 kg (156 lb 6.4 oz)    SpO2: 96% 96%  98%    Intake/Output Summary (Last 24 hours) at 07/08/13 1046 Last data filed at 07/08/13 0639  Gross per 24 hour  Intake 1602.5 ml  Output      0 ml  Net 1602.5 ml    Exam:  HENT:  Head: Atraumatic.  Nose: Nose normal.  Mouth/Throat: Oropharynx is clear and moist.  Eyes: Conjunctivae are normal. Pupils are equal, round, and reactive to light. No scleral icterus.  Neck: Neck supple. No tracheal deviation present.  Cardiovascular: Normal rate, regular rhythm, normal heart sounds and intact distal pulses.  Pulmonary/Chest: Effort normal and breath sounds normal. No respiratory distress.  Abdominal: Soft. Normal appearance and bowel sounds are normal. She exhibits no distension. There is no tenderness.  Musculoskeletal: She exhibits no edema and no tenderness.  Neurological: She is alert. No cranial nerve deficit.    Data Reviewed: Basic Metabolic Panel:  Recent Labs Lab 07/04/13 0048 07/05/13 0605 07/06/13 0710 07/08/13 0500  NA 132* 138 138 136  K 4.1 3.8 4.4 4.1  CL 94* 101 99 101  CO2 27 25 28 27   GLUCOSE 237* 93 120* 57*  BUN 40* 38* 41* 37*  CREATININE 1.22* 1.15* 1.36* 1.14*  CALCIUM 9.8 9.1 9.5 9.6    Liver Function Tests:  Recent Labs Lab 07/05/13 0605 07/08/13 0500  AST 14 18  ALT 9 13  ALKPHOS 81 72  BILITOT 0.3 0.2*  PROT 6.9 6.6  ALBUMIN 3.0* 2.9*   No results found for this basename: LIPASE, AMYLASE,  in the last 168 hours No results found for this basename: AMMONIA,  in the last 168  hours  CBC:  Recent Labs Lab 07/04/13 0048 07/06/13 0710  WBC 9.9 6.8  NEUTROABS 5.6  --   HGB 13.2 12.2  HCT 38.3 36.4  MCV 91.0 90.8  PLT 240 225    Cardiac Enzymes: No results found for this basename: CKTOTAL, CKMB, CKMBINDEX, TROPONINI,  in the last 168 hours BNP (last 3 results) No results found for this basename: PROBNP,  in the last 8760 hours   CBG:  Recent Labs Lab 07/07/13 0738 07/07/13 1139 07/07/13 1638 07/07/13 2023 07/08/13 0745  GLUCAP 72 104* 174* 214* 65*    Recent Results (from the past 240 hour(s))  CULTURE, BLOOD (ROUTINE X 2)     Status: None   Collection Time    07/04/13  1:00 AM      Result Value Range Status   Specimen Description BLOOD ARM RIGHT   Final   Special Requests BOTTLES DRAWN AEROBIC AND ANAEROBIC 10CC   Final   Culture  Setup Time 07/04/2013 11:54   Final   Culture     Final   Value:        BLOOD CULTURE RECEIVED NO GROWTH TO DATE CULTURE WILL BE HELD FOR 5 DAYS BEFORE ISSUING A FINAL NEGATIVE REPORT   Report Status PENDING   Incomplete  CULTURE, BLOOD (ROUTINE X 2)     Status: None   Collection Time    07/04/13  1:10 AM      Result Value Range Status   Specimen Description BLOOD ARM LEFT   Final   Special Requests BOTTLES DRAWN AEROBIC ONLY 10CC   Final   Culture  Setup Time 07/04/2013 11:56   Final   Culture     Final   Value:        BLOOD CULTURE RECEIVED NO GROWTH TO DATE CULTURE WILL BE HELD FOR 5 DAYS BEFORE ISSUING A FINAL NEGATIVE REPORT   Report Status PENDING   Incomplete     Studies: Dg Foot Complete Right  07/07/2013   *RADIOLOGY REPORT*  Clinical Data: Wound infection.  Diabetic ulcer.  Redness, swelling.  RIGHT FOOT COMPLETE - 3+ VIEW  Comparison: 06/13/2013  Findings: Prior right first transmetatarsal amputation. Amputation of the distal phalanx and distal portion of the middle phalanx of the second toe.  Soft tissue defect noted over the fifth MTP joint, similar to prior study.  No underlying acute bony  abnormality.  No convincing evidence for osteomyelitis.  IMPRESSION: Stable chronic findings.  No acute process.   Original Report Authenticated By: Charlett Nose, M.D.    Scheduled Meds: . acetaminophen  500 mg Oral QHS   And  . diphenhydrAMINE  25 mg Oral QHS  . allopurinol  300 mg Oral q morning - 10a  . aspirin EC  81 mg Oral QHS  . atorvastatin  80 mg Oral QHS  . aztreonam  1 g Intravenous Q8H  . carvedilol  6.25 mg Oral BID WC  .  cholecalciferol  1,000 Units Oral q morning - 10a  . clindamycin (CLEOCIN) IV  600 mg Intravenous Q8H  . ferrous sulfate  325 mg Oral BID WC  . glyBURIDE  10 mg Oral BID WC  . heparin subcutaneous  5,000 Units Subcutaneous Q8H  . insulin aspart  0-15 Units Subcutaneous TID WC  . insulin aspart  0-5 Units Subcutaneous QHS  . insulin glargine  32 Units Subcutaneous Daily  . isosorbide mononitrate  30 mg Oral q morning - 10a  . lamoTRIgine  100 mg Oral BID  . pantoprazole  40 mg Oral Daily   Continuous Infusions: . sodium chloride 50 mL/hr at 07/08/13 4098    Principal Problem:   Cellulitis and abscess of foot Active Problems:   Coronary atherosclerosis of native coronary artery   Hyperlipidemia   Essential hypertension, benign   Peripheral arterial occlusive disease   DM2 (diabetes mellitus, type 2)    Time spent: 40 minutes   [facility]  [facility] Pager [phone]. If 8PM-8AM, please contact night-coverage at [email or web address], password [facility] 07/08/2013, 10:46 AM  LOS: 5 days

## 2013-07-09 ENCOUNTER — Telehealth: Payer: Self-pay | Admitting: Surgery

## 2013-07-09 ENCOUNTER — Encounter (HOSPITAL_COMMUNITY)
Admission: EM | Disposition: A | Discharge status: Skilled Nursing Facility | Payer: Self-pay | Source: Home / Self Care | Attending: Internal Medicine

## 2013-07-09 ENCOUNTER — Other Ambulatory Visit: Payer: Self-pay

## 2013-07-09 ENCOUNTER — Ambulatory Visit (HOSPITAL_COMMUNITY): Admission: RE | Admit: 2013-07-09 | Payer: Medicare Other | Source: Ambulatory Visit | Admitting: Surgery

## 2013-07-09 DIAGNOSIS — L97509 Non-pressure chronic ulcer of other part of unspecified foot with unspecified severity: Secondary | ICD-10-CM

## 2013-07-09 DIAGNOSIS — E1169 Type 2 diabetes mellitus with other specified complication: Principal | ICD-10-CM

## 2013-07-09 DIAGNOSIS — L97409 Non-pressure chronic ulcer of unspecified heel and midfoot with unspecified severity: Secondary | ICD-10-CM

## 2013-07-09 DIAGNOSIS — I10 Essential (primary) hypertension: Secondary | ICD-10-CM

## 2013-07-09 DIAGNOSIS — I743 Embolism and thrombosis of arteries of the lower extremities: Secondary | ICD-10-CM

## 2013-07-09 DIAGNOSIS — E119 Type 2 diabetes mellitus without complications: Secondary | ICD-10-CM

## 2013-07-09 HISTORY — PX: ABDOMINAL AORTAGRAM: SHX5454

## 2013-07-09 LAB — BASIC METABOLIC PANEL
BUN: 37 mg/dL — ABNORMAL HIGH (ref 6–23)
CO2: 25 mEq/L (ref 19–32)
Chloride: 105 mEq/L (ref 96–112)
Glucose, Bld: 141 mg/dL — ABNORMAL HIGH (ref 70–99)
Potassium: 5 mEq/L (ref 3.5–5.1)

## 2013-07-09 LAB — GLUCOSE, CAPILLARY
Glucose-Capillary: 74 mg/dL (ref 70–99)
Glucose-Capillary: 85 mg/dL (ref 70–99)
Glucose-Capillary: 86 mg/dL (ref 70–99)

## 2013-07-09 LAB — SURGICAL PCR SCREEN
MRSA, PCR: NEGATIVE
Staphylococcus aureus: NEGATIVE

## 2013-07-09 LAB — POCT ACTIVATED CLOTTING TIME: Activated Clotting Time: 176 seconds

## 2013-07-09 SURGERY — ABDOMINAL AORTAGRAM
Anesthesia: LOCAL

## 2013-07-09 MED ORDER — LABETALOL HCL 5 MG/ML IV SOLN
10.0000 mg | INTRAVENOUS | Status: DC | PRN
  Filled 2013-07-09: qty 4

## 2013-07-09 MED ORDER — GUAIFENESIN-DM 100-10 MG/5ML PO SYRP
15.0000 mL | ORAL_SOLUTION | ORAL | Status: DC | PRN
  Filled 2013-07-09: qty 15

## 2013-07-09 MED ORDER — ACETAMINOPHEN 650 MG RE SUPP: 325.0000 mg | RECTAL | Status: DC | PRN

## 2013-07-09 MED ORDER — MIDAZOLAM HCL 2 MG/2ML IJ SOLN
INTRAMUSCULAR | Status: AC
  Filled 2013-07-09: qty 2

## 2013-07-09 MED ORDER — LIDOCAINE HCL (PF) 1 % IJ SOLN
INTRAMUSCULAR | Status: AC
  Filled 2013-07-09: qty 30

## 2013-07-09 MED ORDER — ONDANSETRON HCL 4 MG/2ML IJ SOLN
4.0000 mg | Freq: Four times a day (QID) | INTRAMUSCULAR | Status: DC | PRN
  Administered 2013-07-09: 4 mg via INTRAVENOUS

## 2013-07-09 MED ORDER — METOPROLOL TARTRATE 1 MG/ML IV SOLN
2.0000 mg | INTRAVENOUS | Status: DC | PRN
  Filled 2013-07-09: qty 5

## 2013-07-09 MED ORDER — ALUM & MAG HYDROXIDE-SIMETH 200-200-20 MG/5ML PO SUSP
30.0000 mL | ORAL | Status: DC | PRN
  Administered 2013-07-09: 30 mL via ORAL
  Filled 2013-07-09: qty 30

## 2013-07-09 MED ORDER — HYDRALAZINE HCL 20 MG/ML IJ SOLN
10.0000 mg | INTRAMUSCULAR | Status: DC | PRN
  Administered 2013-07-09: 10 mg via INTRAVENOUS

## 2013-07-09 MED ORDER — ACETAMINOPHEN 325 MG PO TABS: 325.0000 mg | ORAL_TABLET | ORAL | Status: DC | PRN

## 2013-07-09 MED ORDER — HEPARIN SODIUM (PORCINE) 1000 UNIT/ML IJ SOLN
INTRAMUSCULAR | Status: AC
  Filled 2013-07-09: qty 1

## 2013-07-09 MED ORDER — FENTANYL CITRATE 0.05 MG/ML IJ SOLN
INTRAMUSCULAR | Status: AC
  Filled 2013-07-09: qty 2

## 2013-07-09 MED ORDER — SODIUM CHLORIDE 0.9 % IV SOLN
INTRAVENOUS | Status: DC
  Administered 2013-07-10 (×2): via INTRAVENOUS

## 2013-07-09 NOTE — Telephone Encounter (Addendum)
Message copied by Rosalyn Charters on Tue Jul 09, 2013  2:09 PM ------      Message from: Melene Plan      Created: Tue Jul 09, 2013  1:30 PM                   ----- Message -----         From: Nada Libman, MD         Sent: 07/09/2013  12:58 PM           To: Reuel Derby, Melene Plan, RN            07/09/2013:                  Surgeon:  Jorge Ny      Procedure Performed:       1.  ultrasound-guided access, left femoral artery       2.  abdominal aortogram       3.  right lower extremity runoff       4.  additional order catheterization (peroneal artery)       5.  failed attempt at angioplasty right peroneal artery       6.  angioplasty, right superficial femoral artery            Please schedule the patient to followup in the office in 3 weeks.   ------notified patient of fu appt. with dr. Myra Gianotti on 08-12-13 at 9:15 am, unable to reach patient by phone, mailed appt. letter

## 2013-07-09 NOTE — Progress Notes (Signed)
[facility] PROGRESS NOTE  [name] AOZ:[id number] DOB: 26-Dec-1931 DOA: 07/03/2013 PCP: [facility] C  Assessment/Plan: Principal Problem:   Cellulitis and abscess of foot Active Problems:   Coronary atherosclerosis of native coronary artery   Hyperlipidemia   Essential hypertension, benign   Peripheral arterial occlusive disease   DM2 (diabetes mellitus, type 2)   Cellulitis  -Started the patient on vancomycin and Zosyn; but experienced Reaction to vancomycin  -Abx's Change to Azactam and clindamycin  -Per Vascular surgery angiogram on July 15th with Dr. Myra Gianotti pending complete resolution of cellulitis  -Complicated cardiac history  2-D echo shows EF of 55-65%  Called [place] cardiology , seen by cardiology for cardiology clearance and according to cardiology moderate risk for surgery, but no further pre-operative evaluation needed. -continue holding Plavix for procedure -will continue abx's and wound care.  Acute on chronic renal insufficiency stage II  -Baseline around 1.17  -BMET in am -IVF's to be change after procedure to [facility] -Cr back to baseline   Diabetes:continue Accu-Cheks, Lantus dose to be resume after angiogram today. Continue sliding scale insulin   Hypertension stable. holding Demadex due renal failure  Peripheral vascular disease - discontinue Plavix in anticipation for procedure.will follow outcome and vascular recommendations.  Code Status: full  Family Communication: family updated about patient's clinical progress  Disposition Plan: Per vascular  Brief narrative:  [name] is an 79 y.o. female with hx of HTN, gout, DM2, and CAD s/p CABG, prior CVA with known PVD followed by Dr Linnell Fulling, scheduled for aortogram next week, presents to the ER with increase redness of her right foot. She has been at wound clinic for her ulcer on the lateral aspect of her foot. She had prior toe amputation also. Evaluation in the ER showed Cr of 1.22,  BS of 237, with no leukocytosis. Hospitalist was asked to admit her for cellulitis  Started on Procrit on antibiotics, evaluated by vascular surgery for angiogram today; depending findings angioplasty/recnalization to be attempted  Consultants:  Vascular surgery Procedures:  None Antibiotics:  Azactam and clindamycin  HPI/Subjective:  No complaints; afebrile. Waiting procedure later today  Objective: Filed Vitals:   07/09/13 0550 07/09/13 1000 07/09/13 1146 07/09/13 1615  BP: 138/55 165/76  115/59  Pulse: 68 68 68 82  Temp: 97.5 F (36.4 C) 98 F (36.7 C)  98.5 F (36.9 C)  TempSrc: Oral Oral  Oral  Resp: 20 20  18   Height:      Weight:      SpO2: 93% 96%  98%    Intake/Output Summary (Last 24 hours) at 07/09/13 1638 Last data filed at 07/09/13 0900  Gross per 24 hour  Intake    240 ml  Output      0 ml  Net    240 ml    Exam: Gen: NAD, AAOx3 Cardiovascular:S1 and S2, no rubs or gallops Pulmonary/Chest: CTA bilaterally. No respiratory distress.  Abdominal: Soft. Normal appearance and bowel sounds are normal. She exhibits no distension. There is no tenderness.  Extremities:2 cm ulcer at the base of the right fifth toe which tracks down to the bone. There is surrounding erythema. Trace edema bilaterally  Neurological: She is alert. No cranial nerve deficit.    Data Reviewed: Basic Metabolic Panel:  Recent Labs Lab 07/04/13 0048 07/05/13 0605 07/06/13 0710 07/08/13 0500 07/09/13 0635  NA 132* 138 138 136 138  K 4.1 3.8 4.4 4.1 5.0  CL 94* 101 99 101 105  CO2 27  25 28 27 25   GLUCOSE 237* 93 120* 57* 141*  BUN 40* 38* 41* 37* 37*  CREATININE 1.22* 1.15* 1.36* 1.14* 1.15*  CALCIUM 9.8 9.1 9.5 9.6 9.6    Liver Function Tests:  Recent Labs Lab 07/05/13 0605 07/08/13 0500  AST 14 18  ALT 9 13  ALKPHOS 81 72  BILITOT 0.3 0.2*  PROT 6.9 6.6  ALBUMIN 3.0* 2.9*    CBC:  Recent Labs Lab 07/04/13 0048 07/06/13 0710  WBC 9.9 6.8  NEUTROABS 5.6   --   HGB 13.2 12.2  HCT 38.3 36.4  MCV 91.0 90.8  PLT 240 225   CBG:  Recent Labs Lab 07/08/13 2105 07/09/13 0753 07/09/13 1325 07/09/13 1405 07/09/13 1523  GLUCAP 118* 121* 56* 74 85    Recent Results (from the past 240 hour(s))  CULTURE, BLOOD (ROUTINE X 2)     Status: None   Collection Time    07/04/13  1:00 AM      Result Value Range Status   Specimen Description BLOOD ARM RIGHT   Final   Special Requests BOTTLES DRAWN AEROBIC AND ANAEROBIC 10CC   Final   Culture  Setup Time 07/04/2013 11:54   Final   Culture     Final   Value:        BLOOD CULTURE RECEIVED NO GROWTH TO DATE CULTURE WILL BE HELD FOR 5 DAYS BEFORE ISSUING A FINAL NEGATIVE REPORT   Report Status PENDING   Incomplete  CULTURE, BLOOD (ROUTINE X 2)     Status: None   Collection Time    07/04/13  1:10 AM      Result Value Range Status   Specimen Description BLOOD ARM LEFT   Final   Special Requests BOTTLES DRAWN AEROBIC ONLY 10CC   Final   Culture  Setup Time 07/04/2013 11:56   Final   Culture     Final   Value:        BLOOD CULTURE RECEIVED NO GROWTH TO DATE CULTURE WILL BE HELD FOR 5 DAYS BEFORE ISSUING A FINAL NEGATIVE REPORT   Report Status PENDING   Incomplete  SURGICAL PCR SCREEN     Status: None   Collection Time    07/09/13  3:11 AM      Result Value Range Status   MRSA, PCR NEGATIVE  NEGATIVE Final   Staphylococcus aureus NEGATIVE  NEGATIVE Final   Comment:            The Xpert SA Assay (FDA     approved for NASAL specimens     in patients over 18 years of age),     is one component of     a comprehensive surveillance     program.  Test performance has     been validated by [employer] for patients greater     than or equal to 48 year old.     It is not intended     to diagnose infection nor to     guide or monitor treatment.     Studies: Dg Foot Complete Right  30-Jul-2013   *RADIOLOGY REPORT*  Clinical Data: Wound infection.  Diabetic ulcer.  Redness, swelling.  RIGHT FOOT  COMPLETE - 3+ VIEW  Comparison: 06/13/2013  Findings: Prior right first transmetatarsal amputation. Amputation of the distal phalanx and distal portion of the middle phalanx of the second toe.  Soft tissue defect noted over the fifth MTP joint, similar to prior study.  No underlying acute bony abnormality.  No convincing evidence for osteomyelitis.  IMPRESSION: Stable chronic findings.  No acute process.   Original Report Authenticated By: Charlett Nose, M.D.    Scheduled Meds: . acetaminophen  500 mg Oral QHS   And  . diphenhydrAMINE  25 mg Oral QHS  . allopurinol  300 mg Oral q morning - 10a  . aspirin EC  81 mg Oral QHS  . atorvastatin  80 mg Oral QHS  . aztreonam  1 g Intravenous Q8H  . carvedilol  6.25 mg Oral BID WC  . cholecalciferol  1,000 Units Oral q morning - 10a  . clindamycin (CLEOCIN) Pediatric IVPB >375 mg </=750 mg  600 mg Intravenous Q8H  . ferrous sulfate  325 mg Oral BID WC  . glyBURIDE  10 mg Oral BID WC  . heparin subcutaneous  5,000 Units Subcutaneous Q8H  . insulin aspart  0-15 Units Subcutaneous TID WC  . insulin aspart  0-5 Units Subcutaneous QHS  . insulin glargine  20 Units Subcutaneous Daily  . isosorbide mononitrate  30 mg Oral q morning - 10a  . lamoTRIgine  100 mg Oral BID  . pantoprazole  40 mg Oral Daily   Continuous Infusions: . sodium chloride 75 mL/hr at 07/09/13 1336    Principal Problem:   Cellulitis and abscess of foot Active Problems:   Coronary atherosclerosis of native coronary artery   Hyperlipidemia   Essential hypertension, benign   Peripheral arterial occlusive disease   DM2 (diabetes mellitus, type 2)    Time spent: < 30 minutes   Lynnlee Revels  [facility] Pager [phone]. If 8PM-8AM, please contact night-coverage at [email or web address], password [facility] 07/09/2013, 4:38 PM  LOS: 6 days

## 2013-07-09 NOTE — Op Note (Signed)
[facility] [place]  Patient name: [name] MRN: [id number] DOB: 28-Jan-1931 Sex: female  07/03/2013 - 07/09/2013 Pre-operative Diagnosis: Right lower extremity ulcer Post-operative diagnosis:  Same Surgeon:  Jorge Ny Procedure Performed:  1.  ultrasound-guided access, left femoral artery  2.  abdominal aortogram  3.  right lower extremity runoff  4.  additional order catheterization (peroneal artery)  5.  failed attempt at angioplasty right peroneal artery  6.  angioplasty, right superficial femoral artery    Indications:  The patient has a nonhealing right foot wound. She comes in today for further evaluation and possible treatment  Procedure:  The patient was identified in the holding area and taken to room 8.  The patient was then placed supine on the table and prepped and draped in the usual sterile fashion.  A time out was called.  Ultrasound was used to evaluate the left common femoral artery.  It was patent .  A digital ultrasound image was acquired.  A micropuncture needle was used to access the left common femoral artery under ultrasound guidance.  An 018 wire was advanced without resistance and a micropuncture sheath was placed.  The 018 wire was removed and a benson wire was placed.  The micropuncture sheath was exchanged for a 5 french sheath.  An omniflush catheter was advanced over the wire to the level of L-1.  An abdominal angiogram was obtained.  Next, using the omniflush catheter and a benson wire, the aortic bifurcation was crossed and the catheter was placed into theright external iliac artery and right runoff was obtained.    Findings:   Aortogram:  The visualized portion of the suprarenal abdominal aorta showed no significant pathology. Single renal arteries are identified bilaterally without stenosis. The infrarenal abdominal aorta is widely patent. Bilateral common and external iliac arteries are widely patent.  Right Lower  Extremity:  The right common femoral artery and profunda femoral arteries are widely patent. The right superficial femoral artery is very small and diameter measuring between 4 and 5 mm. Its midportion narrows down for a long segment of approximately  20 cm. this represents a approximately 60-70% stenosis. The popliteal artery is small but patent throughout it's course. The anterior tibial artery is patent proximally however occludes in the distal lower leg. There is reconstitution of the peroneal artery which provides the dominant blood flow out onto the foot. The peroneal artery and tibioperoneal trunk are occluded proximally. The posterior tibial artery is occluded.   Intervention:  After the above images were acquired, the decision was made to proceed with intervention. Over an 035 wire a 7 French 55 cm sheath was advanced into the right superficial femoral artery. The patient was fully heparinized. I then used a 014 Prowater wire supported by a 2 mm Fox SV balloon to gain access into the popliteal artery below the knee. I then was able to cannulate the origin of the tibioperoneal trunk. I attempted to recanalize the tibioperoneal trunk and peroneal artery, however after attempting with multiple wires and creating a dissection as well as an AV fistula I felt that further attempts would be unsuccessful. Therefore, I elected to abort this procedure. I did have the catheter down to the level of the peroneal artery. I then elected to perform angioplasty of the superficial femoral artery. I felt that it was too small to primarily stented. I used a 4 x 1 20 Fox SV balloon. The balloon was taken to nominal pressure and  held for 2 minutes. Overlapping inflations were performed. Completion arteriogram revealed a non-flow-limiting dissection. I elected to repeat balloon angioplasty to try to get the dissection to resolve. The balloon was taken to 4 atmospheres and held for 3 minutes. Followup study revealed continued  dissection, however this was not flow-limiting. Because of the size of the patient's arteries, I felt that stenting would not be in her best interest and so I elected to leave the dissection since it was not flow-limiting. At this point, catheters and wires were removed. The patient was taken to the holding area for sheath pull ulcer coagulation profile corrects.  Impression:  #1  long segment narrowing of a very small diameter superficial femoral artery which was angioplastied using a 4 mm balloon. There was residual non-flow-limiting dissection.  #2  severe tibial disease. The anterior tibial artery is patent proximally however occludes in the lower leg. There is reconstitution of the peroneal artery which is the dominant perfusion to the foot. The peroneal artery is occluded proximally. The tibioperoneal trunk and posterior tibial artery are occluded.  #3  I would recommend continued wound care, and if the patient is unable to heal her wound a repeat attempt at recannulization of the occluded peroneal artery would be recommended.   Juleen China, M.D. [facility] [place] Office: [phone] Pager:  [phone]

## 2013-07-09 NOTE — Interval H&P Note (Signed)
History and Physical Interval Note:  07/09/2013 11:24 AM  [name]  has presented today for surgery, with the diagnosis of PVD  The various methods of treatment have been discussed with the patient and family. After consideration of risks, benefits and other options for treatment, the patient has consented to  Procedure(s): ABDOMINAL AORTAGRAM (N/A) as a surgical intervention .  The patient's history has been reviewed, patient examined, no change in status, stable for surgery.  I have reviewed the patient's chart and labs.  Questions were answered to the patient's satisfaction.     BRABHAM IV, V. WELLS

## 2013-07-09 NOTE — Progress Notes (Signed)
Pt's left groin site bled significantly upon arrival to the unit. The Cath lab RN noticed it as we transferred pt to the bed from stretcher. VS were taken.  Pressure was applied by the RN for as the MD was paged.. MD Brabham notified and stated to continue to hold pressure until it stops and if it does not call him back. Bleeding did stop and a pressure dressing was applied to left groin. No further complications. Will cont to monitor.

## 2013-07-09 NOTE — H&P (View-Only) (Signed)
[facility] [place]   Patient name: [name] MRN: [id number] DOB: 02/27/1931 Sex: female   Referred by: Dr. Cathey  Reason for referral:  Chief Complaint  Patient presents with  . New Evaluation    Non-healing ulcer  C/O Right lateral foot painful with walking and at rest, duation3 mo. Pt. had vascular lab study today.    HISTORY OF PRESENT ILLNESS: The patient is referred for evaluation of a right leg ulcer. The patient states that it has been present for approximately 3 months. She is being seen at the wound center for serial debridements and dressing changes. This does cause her significant pain. She has a history of a right great toe amputation in 2002 which has healed.  The patient is a brittle diabetic and has had trouble regulating her blood sugars. She suffers from hypercholesterolemia and is treated with a statin. She also has coronary artery disease. She is status post CABG. She had an MI last April which she was told needed to be treated medically. She has never smoked.  Past Medical History  Diagnosis Date  . Essential hypertension, benign   . Gout   . GERD (gastroesophageal reflux disease)   . Systolic and diastolic CHF, chronic     Echo 04/05/12 EF 40-45%, anterolateral/posterior hypokinesis, grade 1 diastolic dysfunction, mild MR, mildly dilated LA  . Hyperlipidemia   . Coronary atherosclerosis of native coronary artery     s/p 4V CABG '97; NSTEMI -->Cardiac cath 04/03/12 revealed a new occlusion of SVG to OM system, Medical therapy  . Diabetes mellitus, type 2   . Seizures   . Arthritis   . Osteoporosis   . Retinal hemorrhage, both eyes   . Stroke     1980 and then 2 strokes 2012    Past Surgical History  Procedure Laterality Date  . Coronary artery bypass graft      1997  . Knee surgery      bilateral  . Abdominal hysterectomy    . Cardiac catheterization      11/2010-patent grafts  . Right foot surgery      big toe removed  and other surgeries on rt foot because of infection  . Left hip hemi-arthroplasty  07-31-2011    SURGERY AT [facility] FOR FRACTURE LEFT FEMORAL NECK  . Total hip revision  01/23/2012    Procedure: TOTAL HIP REVISION;  Surgeon: Matthew D Olin, MD;  Location: [facility];  Service: Orthopedics;  Laterality: Left;  Conversion of  Previous Surgery to a Left Total Hip  . Abdominal hysterectomy    . Joint replacement  Jan. 2013    Left Hip    History   Social History  . Marital Status: Widowed    Spouse Name: N/A    Number of Children: N/A  . Years of Education: N/A   Occupational History  . Not on file.   Social History Main Topics  . Smoking status: Never Smoker   . Smokeless tobacco: Never Used  . Alcohol Use: No  . Drug Use: No  . Sexually Active: Not Currently    Birth Control/ Protection: Post-menopausal   Other Topics Concern  . Not on file   Social History Narrative  . No narrative on file    Family History  Problem Relation Age of Onset  . Heart attack Father 67  . Heart disease Father     Allergies as of 07/01/2013 - Review Complete 07/01/2013  Allergen Reaction Noted  .   Ace inhibitors  10/20/2011  . Cephalexin  01/18/2012  . Furosemide Other (See Comments) 04/13/2011  . Levofloxacin Swelling 10/20/2011  . Bactrim Rash 04/13/2011  . Ciprocin-fluocin-procin (fluocinolone acetonide) Rash 04/13/2011  . Ciprofloxacin Rash 01/11/2012  . Codeine Rash 04/13/2011  . Morphine and related Rash 04/13/2011  . Pentazocine Rash 04/13/2011  . Promethazine Swelling and Rash 01/11/2012  . Sulfa antibiotics Swelling and Rash 01/11/2012  . Trimethoprim Swelling and Rash 01/11/2012    Current Outpatient Prescriptions on File Prior to Visit  Medication Sig Dispense Refill  . allopurinol (ZYLOPRIM) 300 MG tablet Take 300 mg by mouth daily.       . amoxicillin (AMOXIL) 500 MG capsule Take 2,000 mg by mouth as directed. Take 4 capsules 1 hour prior to dental procedure.      . aspirin EC  81 MG tablet Take 81 mg by mouth daily.      . atorvastatin (LIPITOR) 80 MG tablet Take 80 mg by mouth daily.      . Biotin 1000 MCG tablet Take 1,000 mcg by mouth daily.       . carvedilol (COREG) 6.25 MG tablet Take 6.25 mg by mouth 2 (two) times daily with a meal.      . cholecalciferol (VITAMIN D) 1000 UNITS tablet Take 1,000 Units by mouth daily.      . clopidogrel (PLAVIX) 75 MG tablet Take 1 tablet by mouth daily.      . diphenhydramine-acetaminophen (TYLENOL PM) 25-500 MG TABS Take 1 tablet by mouth at bedtime as needed. For sleep      . ferrous sulfate 325 (65 FE) MG tablet Take 325 mg by mouth 3 (three) times daily with meals.      . glyBURIDE (DIABETA) 5 MG tablet Take 10 mg by mouth 2 (two) times daily with a meal.       . Glycerin-Hypromellose-PEG 400 (VISINE TEARS OP) Apply to eye as directed.      . isosorbide mononitrate (IMDUR) 30 MG 24 hr tablet Take 30 mg by mouth daily.      . lamoTRIgine (LAMICTAL) 100 MG tablet Take 100 mg by mouth 2 (two) times daily.       . LANTUS SOLOSTAR 100 UNIT/ML SOPN Inject 18 Units into the skin daily.      . meclizine (ANTIVERT) 12.5 MG tablet Take 12.5 mg by mouth 3 (three) times daily as needed.       . nitroGLYCERIN (NITROSTAT) 0.4 MG SL tablet Place 0.4 mg under the tongue every 5 (five) minutes x 3 doses as needed for chest pain (up to 3 doses).      . omeprazole (PRILOSEC) 20 MG capsule Take 20 mg by mouth 2 (two) times daily.       . torsemide (DEMADEX) 20 MG tablet Take 20 mg by mouth Daily.        No current facility-administered medications on file prior to visit.     REVIEW OF SYSTEMS: Cardiovascular: No chest pain, chest pressure, palpitations, orthopnea, or dyspnea on exertion. Positive for pain in her feet when lying flat Pulmonary: No productive cough, asthma or wheezing. Neurologic: No weakness, paresthesias, aphasia, or amaurosis. No dizziness. Hematologic: No bleeding problems or clotting disorders. Musculoskeletal: Right  great toe dictation Gastrointestinal: No blood in stool or hematemesis Genitourinary: No dysuria or hematuria. Psychiatric:: No history of major depression. Integumentary: No rashes or ulcers. Constitutional: No fever or chills.  PHYSICAL EXAMINATION: General: The patient appears their stated age.  Vital signs are   BP 158/83  Pulse 75  Resp 16  Ht 5' 2" (1.575 m)  Wt 158 lb (71.668 kg)  BMI 28.89 kg/m2  SpO2 98% HEENT:  No gross abnormalities Pulmonary: Respirations are non-labored Abdomen: Soft and non-tender  Musculoskeletal: Right great toe is surgically absent Neurologic: No focal weakness or paresthesias are detected, Skin: 2 cm ulcer at the base of the right fifth toe which tracks down to the bone. There is surrounding erythema. Psychiatric: The patient has normal affect. Cardiovascular: There is a regular rate and rhythm without significant murmur appreciated. Pedal pulses are nonpalpable.  Diagnostic Studies: Outside ankle-brachial indices were 0.6 on the right and 0.8 on the left. Duplex ultrasound was reviewed in our office today. This is a high-grade mid superficial femoral artery stenosis.  Outside Studies/Documentation Historical records were reviewed.  They showed nonhealing right toe ulcer in the setting of peripheral vascular disease  Medication Changes: None  Assessment:  Nonhealing right fifth toe ulcer Plan: I discussed with the patient that this is a limb threatening situation. She needs to undergo further arterial evaluation via angiography. We discussed the details of the procedure. If she is a candidate for percutaneous intervention L. proceed as indicated. I will plan on accessing her left groin, studying both legs and treating the right if possible. I'm giving her doxycycline today because I think she does have a component of cellulitis in the wound. I would like for this to resolve somewhat prior to proceeding with her angiography which I have scheduled  for Tuesday, July 15th     V. Wells Halden Phegley IV, M.D. [facility] [place] Office: [phone] Pager:  [phone]   

## 2013-07-10 DIAGNOSIS — E785 Hyperlipidemia, unspecified: Secondary | ICD-10-CM

## 2013-07-10 LAB — CULTURE, BLOOD (ROUTINE X 2): Culture: NO GROWTH

## 2013-07-10 LAB — GLUCOSE, CAPILLARY
Glucose-Capillary: 72 mg/dL (ref 70–99)
Glucose-Capillary: 174 mg/dL — ABNORMAL HIGH (ref 70–99)

## 2013-07-10 MED ORDER — CLOPIDOGREL BISULFATE 75 MG PO TABS
75.0000 mg | ORAL_TABLET | Freq: Every day | ORAL | Status: DC
  Administered 2013-07-11 – 2013-07-12 (×2): 75 mg via ORAL
  Filled 2013-07-10 (×3): qty 1

## 2013-07-10 NOTE — Progress Notes (Signed)
[facility] PROGRESS NOTE  [name] ZOX:[id number] DOB: 1931/04/12 DOA: 07/03/2013 PCP: Orvilla Cornwall C  Assessment/Plan: Principal Problem:   Cellulitis and abscess of foot Active Problems:   Coronary atherosclerosis of native coronary artery   Hyperlipidemia   Essential hypertension, benign   Peripheral arterial occlusive disease   DM2 (diabetes mellitus, type 2)   Cellulitis appears to be improving after 7 days of IV antibiotics. -Started the patient on vancomycin and Zosyn; but experienced Reaction to vancomycin  -Abx's Change to Azactam and clindamycin, day 7 of antibiotics  - Will be unable to transition to oral antibiotics because of her allergy to fluoroquinolones. -Per Vascular surgery angiogram on July 15th with Dr. Myra Gianotti demonstrated long segment of narrowing of the superficial femoral artery which was angioplastied. She also had severe tibial disease which includes the lower leg with reconstitution of the peroneal artery which was also occluded proximally. Attempts to reopen the lower extremity arteries were unsuccessful. -  Continue wound care and consider recannulization attempts in several weeks if not healing.  History CAD s/p 4V CABG 1997 with known occlusion of the SVG to the OM system following NSTEMI 03/2012 with episode of chest pain yesterday. - EF of 55-65%  - EKG appears stable from an EKG from June 13 of this year with right bundle branch block, stable T waves, no evidence of ST segment elevation or depression, and prolonged QTC of 506 -  Will cycle troponins and place telemetry as this patient was moderate risk for cardiac event post vascular surgery  Acute on chronic renal insufficiency stage II, resolved with IVF -Baseline around 1.17  -repeat BMP in AM  Diabetes:  FS stable. -  continue reduced dose lantus 20 units daily and SSI moderate -  Discontinue each bedtime insulin because of relatively low morning fingerstick  Hypertension  stable.  -  Hold torsemide due to acute illness.   Peripheral vascular disease -  Restart plavix   Code Status: full  Family Communication:  Spoke to the patient alone.  Disposition Plan: Per vascular  Brief narrative:  [name] is an 79 y.o. female with hx of HTN, gout, DM2, and CAD s/p CABG, prior CVA with known PVD followed by Dr Linnell Fulling, scheduled for aortogram next week, presents to the ER with increase redness of her right foot. She has been at wound clinic for her ulcer on the lateral aspect of her foot. She had prior toe amputation also. Evaluation in the ER showed Cr of 1.22, BS of 237, with no leukocytosis. Hospitalist was asked to admit her for cellulitis  Started on Procrit on antibiotics, evaluated by vascular surgery for angiogram today; depending findings angioplasty/recnalization to be attempted  Consultants:  Vascular surgery Procedures:  None Antibiotics:  Azactam and clindamycin 7/10 >>  HPI/Subjective:  No complaints; afebrile. States that her foot hurts a lot less now. She had an episode of chest pain with associated shortness of breath which she said to concern her for heart related pain yesterday. She said that she had some relief after she got the nitroglycerin. She had an EKG done yesterday at the time of her chest pain.    Objective: Filed Vitals:   07/09/13 2037 07/10/13 0500 07/10/13 0953 07/10/13 1359  BP: 121/52 125/49 127/53 121/58  Pulse: 86 79 84 80  Temp: 97.9 F (36.6 C) 97.7 F (36.5 C) 97.8 F (36.6 C) 97.6 F (36.4 C)  TempSrc: Oral Oral Oral Oral  Resp: 18 18 20  18  Height:      Weight: 73.846 kg (162 lb 12.8 oz)     SpO2: 100% 94% 96% 98%    Intake/Output Summary (Last 24 hours) at 07/10/13 1542 Last data filed at 07/10/13 1100  Gross per 24 hour  Intake   1408 ml  Output    400 ml  Net   1008 ml    Exam: Gen: Caucasian female, NAD, AAOx3 HEENT:  NCAT, MMM Cardiovascular: RRR, no mrg, S1 and S2, no rubs or  gallops Pulmonary/Chest: CTA bilaterally. No respiratory distress.  Abdominal: Soft. Normal appearance and bowel sounds are normal. She exhibits no distension. There is no tenderness.  Extremities:  2 cm ulcer at the lateral base of the right fifth toe which tracks down to the bone. There is surrounding erythema and edema with puffiness of the toes and forefoot  Neurological: She is alert. No cranial nerve deficit.    Data Reviewed: Basic Metabolic Panel:  Recent Labs Lab 07/04/13 0048 07/05/13 0605 07/06/13 0710 07/08/13 0500 07/09/13 0635  NA 132* 138 138 136 138  K 4.1 3.8 4.4 4.1 5.0  CL 94* 101 99 101 105  CO2 27 25 28 27 25   GLUCOSE 237* 93 120* 57* 141*  BUN 40* 38* 41* 37* 37*  CREATININE 1.22* 1.15* 1.36* 1.14* 1.15*  CALCIUM 9.8 9.1 9.5 9.6 9.6    Liver Function Tests:  Recent Labs Lab 07/05/13 0605 07/08/13 0500  AST 14 18  ALT 9 13  ALKPHOS 81 72  BILITOT 0.3 0.2*  PROT 6.9 6.6  ALBUMIN 3.0* 2.9*    CBC:  Recent Labs Lab 07/04/13 0048 07/06/13 0710  WBC 9.9 6.8  NEUTROABS 5.6  --   HGB 13.2 12.2  HCT 38.3 36.4  MCV 91.0 90.8  PLT 240 225   CBG:  Recent Labs Lab 07/09/13 1523 07/09/13 1614 07/09/13 2033 07/10/13 0733 07/10/13 1127  GLUCAP 85 86 235* 72 133*    Recent Results (from the past 240 hour(s))  CULTURE, BLOOD (ROUTINE X 2)     Status: None   Collection Time    07/04/13  1:00 AM      Result Value Range Status   Specimen Description BLOOD ARM RIGHT   Final   Special Requests BOTTLES DRAWN AEROBIC AND ANAEROBIC 10CC   Final   Culture  Setup Time 07/04/2013 11:54   Final   Culture NO GROWTH 5 DAYS   Final   Report Status 07/10/2013 FINAL   Final  CULTURE, BLOOD (ROUTINE X 2)     Status: None   Collection Time    07/04/13  1:10 AM      Result Value Range Status   Specimen Description BLOOD ARM LEFT   Final   Special Requests BOTTLES DRAWN AEROBIC ONLY 10CC   Final   Culture  Setup Time 07/04/2013 11:56   Final   Culture  NO GROWTH 5 DAYS   Final   Report Status 07/10/2013 FINAL   Final  SURGICAL PCR SCREEN     Status: None   Collection Time    07/09/13  3:11 AM      Result Value Range Status   MRSA, PCR NEGATIVE  NEGATIVE Final   Staphylococcus aureus NEGATIVE  NEGATIVE Final   Comment:            The Xpert SA Assay (FDA     approved for NASAL specimens     in patients over 78 years of age),  is one component of     a comprehensive surveillance     program.  Test performance has     been validated by [facility] for patients greater     than or equal to 77 year old.     It is not intended     to diagnose infection nor to     guide or monitor treatment.     Studies: Dg Foot Complete Right  July 22, 2013   *RADIOLOGY REPORT*  Clinical Data: Wound infection.  Diabetic ulcer.  Redness, swelling.  RIGHT FOOT COMPLETE - 3+ VIEW  Comparison: 06/13/2013  Findings: Prior right first transmetatarsal amputation. Amputation of the distal phalanx and distal portion of the middle phalanx of the second toe.  Soft tissue defect noted over the fifth MTP joint, similar to prior study.  No underlying acute bony abnormality.  No convincing evidence for osteomyelitis.  IMPRESSION: Stable chronic findings.  No acute process.   Original Report Authenticated By: Charlett Nose, M.D.    Scheduled Meds: . acetaminophen  500 mg Oral QHS   And  . diphenhydrAMINE  25 mg Oral QHS  . allopurinol  300 mg Oral q morning - 10a  . aspirin EC  81 mg Oral QHS  . atorvastatin  80 mg Oral QHS  . aztreonam  1 g Intravenous Q8H  . carvedilol  6.25 mg Oral BID WC  . cholecalciferol  1,000 Units Oral q morning - 10a  . clindamycin (CLEOCIN) Pediatric IVPB >375 mg </=750 mg  600 mg Intravenous Q8H  . ferrous sulfate  325 mg Oral BID WC  . glyBURIDE  10 mg Oral BID WC  . heparin subcutaneous  5,000 Units Subcutaneous Q8H  . insulin aspart  0-15 Units Subcutaneous TID WC  . insulin aspart  0-5 Units Subcutaneous QHS  . insulin  glargine  20 Units Subcutaneous Daily  . isosorbide mononitrate  30 mg Oral q morning - 10a  . lamoTRIgine  100 mg Oral BID  . pantoprazole  40 mg Oral Daily   Continuous Infusions: . sodium chloride 75 mL/hr at 07/10/13 0121    Principal Problem:   Cellulitis and abscess of foot Active Problems:   Coronary atherosclerosis of native coronary artery   Hyperlipidemia   Essential hypertension, benign   Peripheral arterial occlusive disease   DM2 (diabetes mellitus, type 2)    Time spent: < 30 minutes   Uziah Sorter  [facility] Pager [phone]. If 8PM-8AM, please contact night-coverage at [email or web address], password [facility] 07/10/2013, 3:42 PM  LOS: 7 days

## 2013-07-11 ENCOUNTER — Telehealth: Payer: Self-pay | Admitting: Surgery

## 2013-07-11 ENCOUNTER — Other Ambulatory Visit: Payer: Self-pay | Admitting: *Deleted

## 2013-07-11 DIAGNOSIS — I739 Peripheral vascular disease, unspecified: Secondary | ICD-10-CM

## 2013-07-11 DIAGNOSIS — Z01810 Encounter for preprocedural cardiovascular examination: Secondary | ICD-10-CM

## 2013-07-11 DIAGNOSIS — I429 Cardiomyopathy, unspecified: Secondary | ICD-10-CM

## 2013-07-11 LAB — SEDIMENTATION RATE: Sed Rate: 50 mm/hr — ABNORMAL HIGH (ref 0–22)

## 2013-07-11 LAB — CBC
HCT: 29.9 % — ABNORMAL LOW (ref 36.0–46.0)
Hemoglobin: 10.3 g/dL — ABNORMAL LOW (ref 12.0–15.0)
WBC: 8.4 10*3/uL (ref 4.0–10.5)

## 2013-07-11 LAB — TROPONIN I
Troponin I: <0.3 ng/mL (ref <0.30)
Troponin I: <0.3 ng/mL (ref <0.30)

## 2013-07-11 LAB — GLUCOSE, CAPILLARY
Glucose-Capillary: 103 mg/dL — ABNORMAL HIGH (ref 70–99)
Glucose-Capillary: 234 mg/dL — ABNORMAL HIGH (ref 70–99)

## 2013-07-11 LAB — C-REACTIVE PROTEIN: CRP: <0.5 mg/dL — ABNORMAL LOW (ref <0.60)

## 2013-07-11 LAB — BASIC METABOLIC PANEL
BUN: 37 mg/dL — ABNORMAL HIGH (ref 6–23)
Chloride: 104 mEq/L (ref 96–112)
GFR calc Af Amer: 52 mL/min — ABNORMAL LOW (ref >90)
Potassium: 4.6 mEq/L (ref 3.5–5.1)

## 2013-07-11 MED ORDER — CLINDAMYCIN PHOSPHATE 600 MG/50ML IV SOLN
600.0000 mg | Freq: Three times a day (TID) | INTRAVENOUS | Status: DC
  Administered 2013-07-11 – 2013-07-12 (×3): 600 mg via INTRAVENOUS
  Filled 2013-07-11 (×6): qty 50

## 2013-07-11 MED ORDER — CARVEDILOL 12.5 MG PO TABS
12.5000 mg | ORAL_TABLET | Freq: Two times a day (BID) | ORAL | Status: DC
  Administered 2013-07-11 – 2013-07-12 (×2): 12.5 mg via ORAL
  Filled 2013-07-11 (×4): qty 1

## 2013-07-11 NOTE — Progress Notes (Signed)
[facility] [place]  Subjective  - POD #1, status post angioplasty right superficial femoral artery  The patient had bleeding upon sheath removal, however her groin remained soft and this was resolved with continuation of manual pressure   Physical Exam:  Right foot continues to improve with decreased cellulitis Left groin is soft.       Assessment/Plan:  POD #1  I discussed with the patient that we will continue with wound care and IV antibiotics. I'll have her come back to the office for followup in 3 weeks. If she continues to struggle to heal this wound I would consider repeat angiography in repeat attempt at recanalizing her occluded peroneal artery.  BRABHAM IV, VAnner Crete 07/11/2013 7:16 AM --  Filed Vitals:   07/11/13 0531  BP: 145/59  Pulse: 75  Temp: 98.1 F (36.7 C)  Resp: 16    Intake/Output Summary (Last 24 hours) at 07/11/13 0716 Last data filed at 07/10/13 1700  Gross per 24 hour  Intake    840 ml  Output    350 ml  Net    490 ml     Laboratory CBC    Component Value Date/Time   WBC 8.4 07/11/2013 0242   HGB 10.3* 07/11/2013 0242   HCT 29.9* 07/11/2013 0242   PLT 218 07/11/2013 0242    BMET    Component Value Date/Time   NA 135 07/11/2013 0242   K 4.6 07/11/2013 0242   CL 104 07/11/2013 0242   CO2 24 07/11/2013 0242   GLUCOSE 152* 07/11/2013 0242   BUN 37* 07/11/2013 0242   CREATININE 1.12* 07/11/2013 0242   CALCIUM 9.1 07/11/2013 0242   GFRNONAA 45* 07/11/2013 0242   GFRAA 52* 07/11/2013 0242    COAG Lab Results  Component Value Date   INR 0.91 04/03/2012   INR 0.97 01/18/2012   INR 1.51* 08/03/2011   No results found for this basename: PTT    Antibiotics Anti-infectives   Start     Dose/Rate Route Frequency Ordered Stop   07/08/13 2200  clindamycin (CLEOCIN) 600 mg in dextrose 5 % 50 mL IVPB     600 mg 54 mL/hr over 60 Minutes Intravenous Every 8 hours 07/08/13 1609     07/05/13 0000  vancomycin (VANCOCIN) 1,250  mg in sodium chloride 0.9 % 250 mL IVPB  Status:  Discontinued     1,250 mg 166.7 mL/hr over 90 Minutes Intravenous Every 24 hours 07/04/13 0053 07/04/13 1155   07/04/13 0900  clindamycin (CLEOCIN) IVPB 600 mg  Status:  Discontinued     600 mg 100 mL/hr over 30 Minutes Intravenous 3 times per day 07/04/13 0847 07/08/13 1608   07/04/13 0600  clindamycin (CLEOCIN) IVPB 600 mg  Status:  Discontinued     600 mg 100 mL/hr over 30 Minutes Intravenous 4 times per day 07/04/13 0458 07/04/13 0845   07/04/13 0300  aztreonam (AZACTAM) 1 g in dextrose 5 % 50 mL IVPB     1 g 100 mL/hr over 30 Minutes Intravenous 3 times per day 07/04/13 0230     07/04/13 0100  vancomycin (VANCOCIN) 1,250 mg in sodium chloride 0.9 % 250 mL IVPB     1,250 mg 166.7 mL/hr over 90 Minutes Intravenous  Once 07/04/13 0053 07/04/13 0233       V. Charlena Cross, M.D. [facility] [place] Office: [phone] Pager:  [phone]

## 2013-07-11 NOTE — Evaluation (Signed)
Occupational Therapy Evaluation and Discharge Patient Details Name: [name] MRN: [id number] DOB: 07/12/1931 Today's Date: 07/11/2013 Time: 1540-1550 OT Time Calculation (min): 10 min  OT Assessment / Plan / Recommendation History of present illness Pt adm with Rt lateral foot ulcer with cellulitis. ABI 0.6 and underwent Rt femoral angioplasty with failed attempt at angioplasty for Rt peroneal a. Pt with h/o DM, CAD, MI, CABG, and Rt 1st toe amputation.   Clinical Impression   Pt admitted with ablve. Would benefit from continued OT at SNF or restorative nursing to maintain her Mod I level. Acute OT will sign off.     OT Assessment  Patient needs continued OT Services    Follow Up Recommendations  SNF    Barriers to Discharge Decreased caregiver support    Equipment Recommendations  None recommended by OT          Precautions / Restrictions Precautions Precautions: None Restrictions Weight Bearing Restrictions: No       ADL  Equipment Used:  (rollator) Transfers/Ambulation Related to ADLs: Mod I with rollator ADL Comments: Pt cannot doff and donn her socks here, but says she can at home the way she has her home set up; otherwise here she is at a Mod I level for BADLs    OT Diagnosis: Generalized weakness       Visit Information  Last OT Received On: 07/11/13 Assistance Needed: +1 History of Present Illness: Pt adm with Rt lateral foot ulcer with cellulitis. ABI 0.6 and underwent Rt femoral angioplasty with failed attempt at angioplasty for Rt peroneal a. Pt with h/o DM, CAD, MI, CABG, and Rt 1st toe amputation.       Prior Functioning     Home Living Family/patient expects to be discharged to:: Skilled nursing facility Living Arrangements: Alone Available Help at Discharge: Family;Friend(s);Neighbor;Available PRN/intermittently Type of Home: Apartment Home Access: Stairs to enter Entrance Stairs-Number of Steps: 1 Entrance Stairs-Rails: None Home  Layout: One level Home Equipment: Walker - 4 wheels;Transport chair;Wheelchair - Careers adviser (comment);Shower seat;Grab bars - tub/shower Additional Comments: family and friends drive her to appts and for groceries Prior Function Level of Independence: Needs assistance Gait / Transfers Assistance Needed: modified independent with rollator; occ uses seat to rest ADL's / Homemaking Assistance Needed: sponge bathes unless niece present; really struggles with donning socks--says she has a sock aide at home but does not know where it is. She also reports she has 2 reachers Comments: Pt to go to SNF for IV antibiotics, could also benefit from therapy while there to keep her at her Mod I level Communication Communication: No difficulties Dominant Hand: Right         Vision/Perception Vision - History Patient Visual Report: No change from baseline   Cognition  Cognition Arousal/Alertness: Awake/alert Behavior During Therapy: WFL for tasks assessed/performed Overall Cognitive Status: Within Functional Limits for tasks assessed    Extremity/Trunk Assessment Upper Extremity Assessment Upper Extremity Assessment: Overall WFL for tasks assessed     Mobility Bed Mobility Details for Bed Mobility Assistance: Pt up in recliner upon arrival Transfers Transfers: Sit to Stand;Stand to Sit Sit to Stand: 6: Modified independent (Device/Increase time);With upper extremity assist;With armrests;From chair/3-in-1 Stand to Sit: 6: Modified independent (Device/Increase time);With upper extremity assist;With armrests;To chair/3-in-1 Details for Transfer Assistance: Pt safe with rollator locking it and unlocking it as appropriate           End of Session OT - End of Session Activity Tolerance: Patient tolerated treatment well  Patient left: in chair;with call bell/phone within reach    Evette Georges [phone] 07/11/2013, 4:21 PM

## 2013-07-11 NOTE — Progress Notes (Addendum)
[facility] PROGRESS NOTE  [name] NWG:[id number] DOB: 07/12/1931 DOA: 07/03/2013 PCP: Orvilla Cornwall C  Assessment/Plan: Principal Problem:   Cellulitis and abscess of foot Active Problems:   Coronary atherosclerosis of native coronary artery   Hyperlipidemia   Essential hypertension, benign   Peripheral arterial occlusive disease   DM2 (diabetes mellitus, type 2)   Osteomyelitis and cellulitis due to infected foot ulcer which extends down to bone.  XR negative for osteomyelitis, however, not a sensitive test.  MRI would be positive secondary to reactive changes from recent infection so will treat empirically for osteomyelitis.  Appears to be improving after 8 days of IV antibiotics.   -  Redness and itching and hives immediately after starting vancomycin -  CRP and ESR for baseline -  Continue clindamycin and aztreonam for total of 4 weeks -  Patient advised that she is at risk for multidrug resistant infections and for C. difficile diarrhea as a result of her long course of antibiotics -  Offered to rechallenge her against vancomycin, but because she had itching and possible hives, it sounds like she may have had an allergy not just a red man syndrome and so we'll defer for now -  Recommend outpatient testing for antibiotic allergies when she is feeling better -  F/u with Dr. Drue Second in ID clinic before cessation of antibiotics.  Peripheral vascular disease likely contributing to her poor wound healing and increased risk of infection, s/p angioplasty RLE on 7/15. -  Appreciate vascular surgery assistance -  Continue plavix  -  Followup with vascular surgery in approximately 3 weeks for her ABIs and reevaluation  History CAD s/p 4V CABG 1997 with known occlusion of the SVG to the OM system following NSTEMI 03/2012 with episode of chest pain 7/15. - EF of 55-65%  - EKG appears stable from an EKG from June 13 of this year with right bundle branch block, stable T waves, no  evidence of ST segment elevation or depression, and prolonged QTC of 506 -  Troponins negative -  Telemetry demonstrated NSR -  D/c telemetry  Acute on chronic renal insufficiency stage II, resolved with IVF -Baseline around 1.17   Diabetes:  FS 103-234 today -  continue reduced dose lantus 20 units daily -  Continue SSI moderate -  No HS insulin due to AM low fingersticks  Hypertension blood pressure elevated -  Hold torsemide for now and restart if signs of edema -  Increase carvedilol  Code Status: full  Family Communication:  Spoke to the patient and two nieces Disposition Plan:   PICC line, SNF placement for IV antibiotics.  Brief narrative:  [name] is an 79 y.o. female with hx of HTN, gout, DM2, and CAD s/p CABG, prior CVA with known PVD followed by Dr Linnell Fulling, scheduled for aortogram next week, presents to the ER with increase redness of her right foot. She has been at wound clinic for her ulcer on the lateral aspect of her foot. She had prior toe amputation also. Evaluation in the ER showed Cr of 1.22, BS of 237, with no leukocytosis. Hospitalist was asked to admit her for cellulitis  Started on Procrit on antibiotics, evaluated by vascular surgery for angiogram today; depending findings angioplasty/recnalization to be attempted  Consultants:  Vascular surgery Procedures:  None Antibiotics:  Azactam and clindamycin 7/10 >>  HPI/Subjective:  No further episodes of chest pain. The patient states that her right foot became increasingly painful overnight, and if she  continues to feel like the toes are swollen. Overall the redness and swelling have improved.    Objective: Filed Vitals:   07/10/13 1743 07/10/13 2116 07/11/13 0531 07/11/13 1000  BP: 130/54 148/60 145/59 151/57  Pulse: 73 77 75 73  Temp: 97.8 F (36.6 C) 97.9 F (36.6 C) 98.1 F (36.7 C) 98 F (36.7 C)  TempSrc: Oral Oral Oral Oral  Resp: 18 18 16 18   Height:      Weight:  72.9 kg (160 lb 11.5  oz)    SpO2: 100% 96% 97% 98%    Intake/Output Summary (Last 24 hours) at 07/11/13 1348 Last data filed at 07/11/13 0900  Gross per 24 hour  Intake    720 ml  Output    350 ml  Net    370 ml    Exam: Gen: Caucasian female, NAD, AAOx3 HEENT:  NCAT, MMM Cardiovascular: RRR, no mrg, S1 and S2, no rubs or gallops Pulmonary/Chest: CTA bilaterally. No respiratory distress.  Abdominal:  Soft.  Normal appearance and bowel sounds are normal. She exhibits no distension. There is no tenderness.  Extremities:  Crusted 2 cm ulcer at the lateral base of the right fifth toe. There is minimal surrounding erythema and edema of the lateral forefoot. Neurological: She is alert. No cranial nerve deficit.    Data Reviewed: Basic Metabolic Panel:  Recent Labs Lab 07/05/13 0605 07/06/13 0710 07/08/13 0500 07/09/13 0635 07/11/13 0242  NA 138 138 136 138 135  K 3.8 4.4 4.1 5.0 4.6  CL 101 99 101 105 104  CO2 25 28 27 25 24   GLUCOSE 93 120* 57* 141* 152*  BUN 38* 41* 37* 37* 37*  CREATININE 1.15* 1.36* 1.14* 1.15* 1.12*  CALCIUM 9.1 9.5 9.6 9.6 9.1    Liver Function Tests:  Recent Labs Lab 07/05/13 0605 07/08/13 0500  AST 14 18  ALT 9 13  ALKPHOS 81 72  BILITOT 0.3 0.2*  PROT 6.9 6.6  ALBUMIN 3.0* 2.9*    CBC:  Recent Labs Lab 07/06/13 0710 07/11/13 0242  WBC 6.8 8.4  HGB 12.2 10.3*  HCT 36.4 29.9*  MCV 90.8 90.6  PLT 225 218   CBG:  Recent Labs Lab 07/10/13 1127 07/10/13 1622 07/10/13 2121 07/11/13 0725 07/11/13 1208  GLUCAP 133* 216* 174* 103* 234*    Recent Results (from the past 240 hour(s))  CULTURE, BLOOD (ROUTINE X 2)     Status: None   Collection Time    07/04/13  1:00 AM      Result Value Range Status   Specimen Description BLOOD ARM RIGHT   Final   Special Requests BOTTLES DRAWN AEROBIC AND ANAEROBIC 10CC   Final   Culture  Setup Time 07/04/2013 11:54   Final   Culture NO GROWTH 5 DAYS   Final   Report Status 07/10/2013 FINAL   Final  CULTURE,  BLOOD (ROUTINE X 2)     Status: None   Collection Time    07/04/13  1:10 AM      Result Value Range Status   Specimen Description BLOOD ARM LEFT   Final   Special Requests BOTTLES DRAWN AEROBIC ONLY 10CC   Final   Culture  Setup Time 07/04/2013 11:56   Final   Culture NO GROWTH 5 DAYS   Final   Report Status 07/10/2013 FINAL   Final  SURGICAL PCR SCREEN     Status: None   Collection Time    07/09/13  3:11 AM  Result Value Range Status   MRSA, PCR NEGATIVE  NEGATIVE Final   Staphylococcus aureus NEGATIVE  NEGATIVE Final   Comment:            The Xpert SA Assay (FDA     approved for NASAL specimens     in patients over 63 years of age),     is one component of     a comprehensive surveillance     program.  Test performance has     been validated by [employer] for patients greater     than or equal to 36 year old.     It is not intended     to diagnose infection nor to     guide or monitor treatment.     Studies: Dg Foot Complete Right  18-Jul-2013   *RADIOLOGY REPORT*  Clinical Data: Wound infection.  Diabetic ulcer.  Redness, swelling.  RIGHT FOOT COMPLETE - 3+ VIEW  Comparison: 06/13/2013  Findings: Prior right first transmetatarsal amputation. Amputation of the distal phalanx and distal portion of the middle phalanx of the second toe.  Soft tissue defect noted over the fifth MTP joint, similar to prior study.  No underlying acute bony abnormality.  No convincing evidence for osteomyelitis.  IMPRESSION: Stable chronic findings.  No acute process.   Original Report Authenticated By: Charlett Nose, M.D.    Scheduled Meds: . acetaminophen  500 mg Oral QHS   And  . diphenhydrAMINE  25 mg Oral QHS  . allopurinol  300 mg Oral q morning - 10a  . aspirin EC  81 mg Oral QHS  . atorvastatin  80 mg Oral QHS  . aztreonam  1 g Intravenous Q8H  . carvedilol  6.25 mg Oral BID WC  . cholecalciferol  1,000 Units Oral q morning - 10a  . clindamycin (CLEOCIN) Pediatric IVPB >375 mg  </=750 mg  600 mg Intravenous Q8H  . clopidogrel  75 mg Oral Q breakfast  . ferrous sulfate  325 mg Oral BID WC  . glyBURIDE  10 mg Oral BID WC  . heparin subcutaneous  5,000 Units Subcutaneous Q8H  . insulin aspart  0-15 Units Subcutaneous TID WC  . insulin glargine  20 Units Subcutaneous Daily  . isosorbide mononitrate  30 mg Oral q morning - 10a  . lamoTRIgine  100 mg Oral BID  . pantoprazole  40 mg Oral Daily   Continuous Infusions: . sodium chloride 75 mL/hr at 07/10/13 1753    Principal Problem:   Cellulitis and abscess of foot Active Problems:   Coronary atherosclerosis of native coronary artery   Hyperlipidemia   Essential hypertension, benign   Peripheral arterial occlusive disease   DM2 (diabetes mellitus, type 2)    Time spent: < 30 minutes   Eagan Shifflett  [facility] Pager [phone]. If 8PM-8AM, please contact night-coverage at [email or web address], password [facility] 07/11/2013, 1:48 PM  LOS: 8 days

## 2013-07-11 NOTE — Evaluation (Signed)
Physical Therapy Evaluation Patient Details Name: [name] MRN: [id number] DOB: 24-Jul-1931 Today's Date: 07/11/2013 Time: 4098-1191 PT Time Calculation (min): 32 min  PT Assessment / Plan / Recommendation History of Present Illness  Pt adm with Rt lateral foot ulcer with cellulitis. ABI 0.6 and underwent Rt femoral angioplasty with failed attempt at angioplasty for Rt peroneal a. Pt with h/o DM, CAD, MI, CABG, and Rt 1st toe amputation.   Clinical Impression  Patient evaluated by Physical Therapy with no further acute PT needs identified. All education has been completed and the patient has no further questions. See below for any follow-up Physial Therapy or equipment needs. PT is signing off. Thank you for this referral.     PT Assessment  Patent does not need any further PT services    Follow Up Recommendations  No PT follow up    Does the patient have the potential to tolerate intense rehabilitation      Barriers to Discharge        Equipment Recommendations  None recommended by PT    Recommendations for Other Services     Frequency      Precautions / Restrictions Precautions Precautions: None (pt denies falls except when she had seizure)   Pertinent Vitals/Pain Rt foot "it's much better" no antalgic gait noted      Mobility  Bed Mobility Bed Mobility: Supine to Sit;Sitting - Scoot to Edge of Bed Supine to Sit: 6: Modified independent (Device/Increase time);HOB elevated (HOB 20) Sitting - Scoot to Edge of Bed: 6: Modified independent (Device/Increase time) Transfers Transfers: Sit to Stand;Stand to Sit Sit to Stand: 6: Modified independent (Device/Increase time);With upper extremity assist;From bed;From chair/3-in-1;Other (comment) (from rollator) Stand to Sit: 6: Modified independent (Device/Increase time);With upper extremity assist;To chair/3-in-1;Other (comment) (to rollator) Details for Transfer Assistance: x 4 from various surfaces; demonstrates  proper/safe use of rollator Ambulation/Gait Ambulation/Gait Assistance: 5: Supervision;6: Modified independent (Device/Increase time) Ambulation Distance (Feet): 230 Feet Assistive device: 4-wheeled walker Ambulation/Gait Assistance Details: initially rollator handles too high and pt pushing it too far ahead; handles adjusted and pt able to keep it closer to her body Gait Pattern: Step-through pattern;Decreased stride length    Exercises     PT Diagnosis:    PT Problem List:   PT Treatment Interventions:       PT Goals(Current goals can be found in the care plan section) Acute Rehab PT Goals Patient Stated Goal: return to her apartment with less foot pain PT Goal Formulation: No goals set, d/c therapy  Visit Information  Last PT Received On: 07/11/13 Assistance Needed: +1 History of Present Illness: Pt adm with Rt lateral foot ulcer with cellulitis. ABI 0.6 and underwent Rt femoral angioplasty with failed attempt at angioplasty for Rt peroneal a. Pt with h/o DM, CAD, MI, CABG, and Rt 1st toe amputation.       Prior Functioning  Home Living Family/patient expects to be discharged to:: Private residence Living Arrangements: Alone Available Help at Discharge: Family;Friend(s);Neighbor;Available PRN/intermittently Type of Home: Apartment Home Access: Stairs to enter Entrance Stairs-Number of Steps: 1 Entrance Stairs-Rails: None Home Layout: One level Home Equipment: Walker - 4 wheels;Transport chair;Wheelchair - Careers adviser (comment);Shower seat;Grab bars - tub/shower (vanity beside toilet (std)) Additional Comments: family and friends drive her to appts and for groceries Prior Function Level of Independence: Needs assistance Gait / Transfers Assistance Needed: modified independent with rollator; occ uses seat to rest ADL's / Homemaking Assistance Needed: sponge bathes unless niece present  Communication Communication:  No difficulties    Cognition   Cognition Arousal/Alertness: Awake/alert Behavior During Therapy: WFL for tasks assessed/performed Overall Cognitive Status: Within Functional Limits for tasks assessed    Extremity/Trunk Assessment Upper Extremity Assessment Upper Extremity Assessment: Overall WFL for tasks assessed Lower Extremity Assessment Lower Extremity Assessment: Overall WFL for tasks assessed   Balance Balance Balance Assessed: Yes Static Sitting Balance Static Sitting - Balance Support: No upper extremity supported;Feet unsupported Static Sitting - Level of Assistance: 7: Independent Static Standing Balance Static Standing - Balance Support: Bilateral upper extremity supported Static Standing - Level of Assistance: 6: Modified independent (Device/Increase time) Dynamic Standing Balance Dynamic Standing - Balance Support: Right upper extremity supported;Left upper extremity supported (Rt or Lt) Dynamic Standing - Level of Assistance: 6: Modified independent (Device/Increase time)  End of Session PT - End of Session Equipment Utilized During Treatment: Gait belt Activity Tolerance: Patient tolerated treatment well Patient left: in chair;with call bell/phone within reach;with nursing/sitter in room  GP     Cullan Launer 07/11/2013, 9:29 AM Pager [phone]

## 2013-07-11 NOTE — Telephone Encounter (Addendum)
Message copied by Rosalyn Charters on Thu Jul 11, 2013 10:26 AM ------      Message from: Melene Plan      Created: Thu Jul 11, 2013  9:38 AM                   ----- Message -----         From: Nada Libman, MD         Sent: 07/11/2013   7:18 AM           To: Reuel Derby, Melene Plan, RN            07/10/2013:            Followup hospital visit            Please schedule the patient followup in my office in 3 weeks with a duplex of her right lower extremity and ABI's ------unable to reach patient by phone mailed appt. letter notifying patient of fu appt. with dr. Myra Gianotti on 08-05-13 10:30

## 2013-07-12 DIAGNOSIS — M869 Osteomyelitis, unspecified: Secondary | ICD-10-CM

## 2013-07-12 DIAGNOSIS — M9080 Osteopathy in diseases classified elsewhere, unspecified site: Secondary | ICD-10-CM

## 2013-07-12 LAB — CBC
HCT: 33.1 % — ABNORMAL LOW (ref 36.0–46.0)
MCH: 30.9 pg (ref 26.0–34.0)
MCV: 91.2 fL (ref 78.0–100.0)
RDW: 13.8 % (ref 11.5–15.5)
WBC: 7.6 10*3/uL (ref 4.0–10.5)

## 2013-07-12 LAB — BASIC METABOLIC PANEL
CO2: 25 mEq/L (ref 19–32)
Chloride: 105 mEq/L (ref 96–112)
Creatinine, Ser: 1.07 mg/dL (ref 0.50–1.10)
Glucose, Bld: 72 mg/dL (ref 70–99)

## 2013-07-12 LAB — GLUCOSE, CAPILLARY
Glucose-Capillary: 76 mg/dL (ref 70–99)
Glucose-Capillary: 234 mg/dL — ABNORMAL HIGH (ref 70–99)

## 2013-07-12 MED ORDER — HEPARIN SOD (PORK) LOCK FLUSH 100 UNIT/ML IV SOLN
250.0000 [IU] | INTRAVENOUS | Status: AC | PRN
  Administered 2013-07-12: 250 [IU]

## 2013-07-12 MED ORDER — ACETAMINOPHEN 325 MG PO TABS: 325.0000 mg | ORAL_TABLET | ORAL | Status: DC | PRN

## 2013-07-12 MED ORDER — SODIUM CHLORIDE 0.9 % IJ SOLN: 10.0000 mL | INTRAMUSCULAR | Status: DC | PRN

## 2013-07-12 MED ORDER — CLINDAMYCIN HCL 300 MG PO CAPS
600.0000 mg | ORAL_CAPSULE | Freq: Three times a day (TID) | ORAL | Status: DC
  Administered 2013-07-12: 600 mg via ORAL
  Filled 2013-07-12 (×3): qty 2

## 2013-07-12 MED ORDER — CARVEDILOL 12.5 MG PO TABS: 6.2500 mg | ORAL_TABLET | Freq: Two times a day (BID) | ORAL | Status: AC

## 2013-07-12 MED ORDER — DEXTROSE 5 % IV SOLN: 1.0000 g | Freq: Three times a day (TID) | INTRAVENOUS | Status: DC

## 2013-07-12 MED ORDER — CLINDAMYCIN HCL 300 MG PO CAPS: 600.0000 mg | ORAL_CAPSULE | Freq: Three times a day (TID) | ORAL | Status: DC

## 2013-07-12 NOTE — Progress Notes (Signed)
Pending discharge, PIV removed from Left Forearm, catheter intact. Discharge instructions reviewed with patient and family. Verbalized understanding of need to make appointments with physicians for 2 weeks, 3 weeks, and 1 month.  Verbalized understanding of medications to be given at [facility] and need to call doctor if symptoms of intolerance or adverse reaction occur.  To be transported to [facility] by family. Intention is to go by her home to pick up some things.  Patient was escorted to building exit via wheelchair in stable condition and no complaints of pain. Report called to Arelia Longest, LPN at [facility]. Berdene Askari, Eli Phillips, RN

## 2013-07-12 NOTE — Discharge Summary (Addendum)
Physician Discharge Summary  [name]:[id number] DOB: [birth year] DOA: 07/03/2013  PCP: Wendall Papa  Admit date: 07/03/2013 Discharge date: 07/12/2013  Recommendations for Outpatient Follow-up:  1. To skilled nursing facility for three weeks to receive IV antibiotics. She would benefit from ongoing occupational therapy to maintain her activity level. 2. Follow up with infectious disease, Dr. Drue Second, in 2-3 weeks, prior to antibiotics being discontinued.  Call to schedule appointment 3. Follow up with Dr. Myra Gianotti for ABI and appointment in 3 weeks, appointment already scheduled 4. Follow up with Cardiology, see information below to schedule appointment for within one month 5. Antibiotics to continue until August 5th, then okay to discontinue PICC line and antibiotics  Discharge Diagnoses:  Principal Problem:   Diabetic osteomyelitis Active Problems:   Coronary atherosclerosis of native coronary artery   Hyperlipidemia   Essential hypertension, benign   Peripheral arterial occlusive disease   Cellulitis and abscess of foot   DM2 (diabetes mellitus, type 2)   Discharge Condition: stable, improved  Diet recommendation: Healthy heart  Wt Readings from Last 3 Encounters:  07/11/13 72.5 kg (159 lb 13.3 oz)  07/11/13 72.5 kg (159 lb 13.3 oz)  07/01/13 71.668 kg (158 lb)    History of present illness:   [name] is an 79 y.o. female with hx of HTN, gout, DM2, and CAD s/p CABG, prior CVA with known PVD followed by Dr Linnell Fulling, scheduled for aortogram next week, presents to the ER with increase redness of her right foot. She has been at wound clinic for her ulcer on the lateral aspect of her foot. She had prior toe amputation also. Evaluation in the ER showed Cr of 1.22, BS of 237, with no leukocytosis. Hospitalist was asked to admit her for cellulitis.  Hospital Course:   Osteomyelitis and cellulitis due to infected foot ulcer which extends down to bone. XR was  negative for osteomyelitis, however, this is not a sensitive test.  CRP after 8 days of therapy is normal, however, ESR was 50.  Her erythema and induration slowly improved on clindamycin and aztreonam.  She will be treated empirically for osteomyelitis for 4 weeks total, last day on August 5th.  She will follow up with the infectious disease clinic prior to discontinuing her antibiotics.  Patient was advised that she is at risk for multidrug resistant infections and for C. difficile diarrhea as a result of her long course of antibiotics.  She should seek immediate medical attention if she develops loose watery or frequent stools.    Peripheral vascular disease likely contributing to her poor wound healing and increased risk of infection.  She was seen by Vascular Surgeon Dr. Myra Gianotti and underwent s/p angioplasty RLE on 7/15.  She had successful angioplasty of the right superficial femoral artery with some dissection which was nonoccluding. There was a failed attempt at angioplasty of the right peroneal artery.  She continued Plavix and will follow up with vascular surgery in approximately 3 weeks for ABIs and reevaluation.  History CAD s/p 4V CABG 1997 with known occlusion of the SVG to the OM system following NSTEMI 03/2012 with episode of chest pain 7/15.  EF of 55-65%.  EKG remained stable from an EKG from June 13 of this year with right bundle branch block, stable T waves, no evidence of ST segment elevation or depression, and prolonged QTC of 506.   Troponins negative and telemetry demonstrated NSR.  She should follow up with her cardiologist within one month.  Acute on chronic renal insufficiency stage II, mild and resolved with IVF.  Baseline around 1.17.  Diabetes: FS 103-234 today.  Reduced lantus to 20 units daily and morning fingersticks ranged from 70s-100s.  She continued SSI moderate (0-15 units) with meals.  No HS insulin due to AM low fingersticks.  She may resume her previous oral SU and  her long acting 18 units.    Hypertension blood pressure elevated.  Her carvedilol was increased.  Her torsemide was initially held, but may be restarted if she gains more than 3-lbs in 1 day or 5-lbs in one week or develops lower extremity swelling.    Consultants:  Vascular surgery Procedures:  Angioplasty RLE on 7/15 by Dr. Myra Gianotti Antibiotics:  Azactam and clindamycin 7/10 >>  Discharge Exam: Filed Vitals:   07/12/13 0934  BP: 159/60  Pulse: 75  Temp: 97.9 F (36.6 C)  Resp: 18   Filed Vitals:   07/11/13 1739 07/11/13 2110 07/12/13 0531 07/12/13 0934  BP: 146/61 151/58 149/62 159/60  Pulse: 74 77 83 75  Temp: 97.6 F (36.4 C) 98.5 F (36.9 C) 97.9 F (36.6 C) 97.9 F (36.6 C)  TempSrc: Oral Oral Oral   Resp: 18 18 18 18   Height:      Weight:  72.5 kg (159 lb 13.3 oz)    SpO2: 98% 100% 98% 99%    Gen: Caucasian female, NAD, AAOx3  HEENT: NCAT, MMM  Cardiovascular: RRR, no mrg, S1 and S2, no rubs or gallops  Pulmonary/Chest: CTA bilaterally. No respiratory distress.  Abdominal: NABS, soft, NT/D  Extremities: Crusted 2 cm ulcer at the lateral base of the right fifth toe. There is minimal surrounding erythema and edema of the lateral forefoot.  Minimally TTP.   Neurological:  Grossly intact   Discharge Instructions      Discharge Orders   Future Appointments Provider Department Dept Phone   08/05/2013 10:30 AM [facility] [phone]   08/05/2013 11:00 AM [facility] [phone]   08/05/2013 11:30 AM Nada Libman, MD [facility] [phone]   09/20/2013 1:15 PM Sherrie George, MD [facility] [phone]   10/25/2013 2:30 PM York Spaniel, MD [facility] [phone]   Future Orders Complete By Expires     (HEART FAILURE PATIENTS) Call MD:  Anytime you have any of the following symptoms: 1) 3  pound weight gain in 24 hours or 5 pounds in 1 week 2) shortness of breath, with or without a dry hacking cough 3) swelling in the hands, feet or stomach 4) if you have to sleep on extra pillows at night in order to breathe.  As directed     Call MD for:  difficulty breathing, headache or visual disturbances  As directed     Call MD for:  extreme fatigue  As directed     Call MD for:  hives  As directed     Call MD for:  persistant dizziness or light-headedness  As directed     Call MD for:  persistant nausea and vomiting  As directed     Call MD for:  redness, tenderness, or signs of infection (pain, swelling, redness, odor or green/yellow discharge around incision site)  As directed     Call MD for:  severe uncontrolled pain  As directed     Call MD for:  temperature >100.4  As directed  Diet - low sodium heart healthy  As directed     Discharge instructions  As directed     Comments:      You were hospitalized with a bone and skin infection of your right foot.  You will need to have IV antibiotics for a total of 4 weeks with an additional oral antibiotic for the same duration.  You will follow up with Dr. Drue Second from Infectious Disease in 2 to 3 weeks prior to your antibiotics being discontinued.  Please call to schedule this appointment.  You also had angioplasty of your right leg by vascular surgery to improve blood flow to your leg.  You will need to follow up in their clinic in approximately 3 weeks - the appointment has been scheduled for you.  Please seek immediate medical attention if you develop fevers, chills, worsening pain or swelling or redness of your right foot or frequent watery diarrhea.    Increase activity slowly  As directed     No wound care  As directed     Comments:      Open to air        Medication List    STOP taking these medications       doxycycline 100 MG tablet  Commonly known as:  ADOXA     torsemide 20 MG tablet  Commonly known as:  DEMADEX       TAKE these medications       acetaminophen 325 MG tablet  Commonly known as:  TYLENOL  Take 1-2 tablets (325-650 mg total) by mouth every 4 (four) hours as needed.     allopurinol 300 MG tablet  Commonly known as:  ZYLOPRIM  Take 300 mg by mouth every morning.     aspirin EC 81 MG tablet  Take 81 mg by mouth at bedtime.     atorvastatin 80 MG tablet  Commonly known as:  LIPITOR  Take 80 mg by mouth at bedtime.     Biotin 1000 MCG tablet  Take 1,000 mcg by mouth daily.     carvedilol 12.5 MG tablet  Commonly known as:  COREG  Take 0.5 tablets (6.25 mg total) by mouth 2 (two) times daily with a meal.     cholecalciferol 1000 UNITS tablet  Commonly known as:  VITAMIN D  Take 1,000 Units by mouth every morning.     CITRACAL PO  Take 2 tablets by mouth every morning.     clindamycin 300 MG capsule  Commonly known as:  CLEOCIN  Take 2 capsules (600 mg total) by mouth every 8 (eight) hours.     clopidogrel 75 MG tablet  Commonly known as:  PLAVIX  Take 1 tablet by mouth every morning.     dextrose 5 % SOLN 50 mL with aztreonam 1 G SOLR 1 g  Inject 1 g into the vein every 8 (eight) hours.     diphenhydramine-acetaminophen 25-500 MG Tabs  Commonly known as:  TYLENOL PM  Take 1 tablet by mouth at bedtime.     ferrous sulfate 325 (65 FE) MG tablet  Take 325 mg by mouth 2 (two) times daily.     glyBURIDE 5 MG tablet  Commonly known as:  DIABETA  Take 10 mg by mouth 2 (two) times daily with a meal.     isosorbide mononitrate 30 MG 24 hr tablet  Commonly known as:  IMDUR  Take 30 mg by mouth every morning.     lamoTRIgine 100 MG  tablet  Commonly known as:  LAMICTAL  Take 100 mg by mouth 2 (two) times daily.     LANTUS SOLOSTAR 100 UNIT/ML Sopn  Generic drug:  Insulin Glargine  Inject 18 Units into the skin every morning.     meclizine 12.5 MG tablet  Commonly known as:  ANTIVERT  Take 12.5 mg by mouth 3 (three) times daily as needed for dizziness. For inner  ear/dizziness     nitroGLYCERIN 0.4 MG SL tablet  Commonly known as:  NITROSTAT  Place 0.4 mg under the tongue every 5 (five) minutes x 3 doses as needed for chest pain (up to 3 doses).     omeprazole 20 MG capsule  Commonly known as:  PRILOSEC  Take 20 mg by mouth 2 (two) times daily.     QC PEN NEEDLES 31G X 6 MM Misc  Generic drug:  Insulin Pen Needle  3 (three) times daily.     SANTYL ointment  Generic drug:  collagenase  Apply 1 application topically daily.     VISINE TEARS OP  Place 1 drop into both eyes daily as needed. For dry eyes       Follow-up Information   Follow up with Myra Gianotti IV, Lala Lund, MD In 3 weeks.   Contact information:   [address] [place] [address] [phone]       Follow up with Judyann Munson, MD. Schedule an appointment as soon as possible for a visit in 2 weeks.   [address] [place] [address] [phone]       Follow up with Nona Dell, MD. Schedule an appointment as soon as possible for a visit in 1 month.   Contact information:   [address] [place] [address] [phone]       The results of significant diagnostics from this hospitalization (including imaging, microbiology, ancillary and laboratory) are listed below for reference.    Significant Diagnostic Studies: Dg Foot Complete Right  2013/07/30   *RADIOLOGY REPORT*  Clinical Data: Wound infection.  Diabetic ulcer.  Redness, swelling.  RIGHT FOOT COMPLETE - 3+ VIEW  Comparison: 06/13/2013  Findings: Prior right first transmetatarsal amputation. Amputation of the distal phalanx and distal portion of the middle phalanx of the second toe.  Soft tissue defect noted over the fifth MTP joint, similar to prior study.  No underlying acute bony abnormality.  No convincing evidence for osteomyelitis.  IMPRESSION: Stable chronic findings.  No acute process.   Original Report Authenticated By: Charlett Nose, M.D.     Microbiology: Recent Results (from the past 240 hour(s))  CULTURE, BLOOD (ROUTINE X 2)     Status: None   Collection Time    07/04/13  1:00 AM      Result Value Range Status   Specimen Description BLOOD ARM RIGHT   Final   Special Requests BOTTLES DRAWN AEROBIC AND ANAEROBIC 10CC   Final   Culture  Setup Time 07/04/2013 11:54   Final   Culture NO GROWTH 5 DAYS   Final   Report Status 07/10/2013 FINAL   Final  CULTURE, BLOOD (ROUTINE X 2)     Status: None   Collection Time    07/04/13  1:10 AM      Result Value Range Status   Specimen Description BLOOD ARM LEFT   Final   Special Requests BOTTLES DRAWN AEROBIC ONLY 10CC   Final   Culture  Setup Time 07/04/2013 11:56  Final   Culture NO GROWTH 5 DAYS   Final   Report Status 07/10/2013 FINAL   Final  SURGICAL PCR SCREEN     Status: None   Collection Time    07/09/13  3:11 AM      Result Value Range Status   MRSA, PCR NEGATIVE  NEGATIVE Final   Staphylococcus aureus NEGATIVE  NEGATIVE Final   Comment:            The Xpert SA Assay (FDA     approved for NASAL specimens     in patients over 32 years of age),     is one component of     a comprehensive surveillance     program.  Test performance has     been validated by [employer] for patients greater     than or equal to 16 year old.     It is not intended     to diagnose infection nor to     guide or monitor treatment.     Labs: Basic Metabolic Panel:  Recent Labs Lab 07/06/13 0710 07/08/13 0500 07/09/13 0635 07/11/13 0242 07/12/13 0515  NA 138 136 138 135 137  K 4.4 4.1 5.0 4.6 4.7  CL 99 101 105 104 105  CO2 28 27 25 24 25   GLUCOSE 120* 57* 141* 152* 72  BUN 41* 37* 37* 37* 31*  CREATININE 1.36* 1.14* 1.15* 1.12* 1.07  CALCIUM 9.5 9.6 9.6 9.1 9.6   Liver Function Tests:  Recent Labs Lab 07/08/13 0500  AST 18  ALT 13  ALKPHOS 72  BILITOT 0.2*  PROT 6.6  ALBUMIN 2.9*   No results found for this basename: LIPASE, AMYLASE,  in the last 168  hours No results found for this basename: AMMONIA,  in the last 168 hours CBC:  Recent Labs Lab 07/06/13 0710 07/11/13 0242 07/12/13 0515  WBC 6.8 8.4 7.6  HGB 12.2 10.3* 11.2*  HCT 36.4 29.9* 33.1*  MCV 90.8 90.6 91.2  PLT 225 218 222   Cardiac Enzymes:  Recent Labs Lab 07/11/13 0242 07/11/13 1010 07/11/13 1510  TROPONINI <0.30 <0.30 <0.30   BNP: BNP (last 3 results) No results found for this basename: PROBNP,  in the last 8760 hours CBG:  Recent Labs Lab 07/11/13 0725 07/11/13 1208 07/11/13 1626 07/11/13 2110 07/12/13 0735  GLUCAP 103* 234* 251* 165* 76    Time coordinating discharge: 45 minutes  Signed:   Jovontae Banko  [facility] 07/12/2013, 11:24 AM

## 2013-07-12 NOTE — Clinical Social Work Placement (Signed)
Clinical Social Work Department CLINICAL SOCIAL WORK PLACEMENT NOTE 07/12/2013  Patient:  [name], [name]  Account Number:  [id number] Admit date:  07/03/2013  Clinical Social Worker:  Genelle Bal, LCSW  Date/time:  07/12/2013 06:58 AM  Clinical Social Work is seeking post-discharge placement for this patient at the following level of care:   SKILLED NURSING   (*CSW will update this form in Epic as items are completed)     Patient/family provided with [facility] Department of Clinical Social Work's list of facilities offering this level of care within the geographic area requested by the patient (or if unable, by the patient's family).  07/12/2013  Patient/family informed of their freedom to choose among providers that offer the needed level of care, that participate in Medicare, Medicaid or managed care program needed by the patient, have an available bed and are willing to accept the patient.    Patient/family informed of [facility]' ownership interest in [facility], as well as of the fact that they are under no obligation to receive care at this facility.  PASARR submitted to EDS on 08/01/2011 PASARR number received from EDS on 08/01/2011  FL2 transmitted to all facilities in geographic area requested by pt/family on  07/12/2013 FL2 transmitted to all facilities within larger geographic area on   Patient informed that his/her managed care company has contracts with or will negotiate with  certain facilities, including the following:     Patient/family informed of bed offers received:  07/12/2013 Patient chooses bed at [facility] Physician recommends and patient chooses bed at    Patient to be transferred to [facility] on  07/12/2013 Patient to be transferred to facility by family  The following physician request were entered in Epic:   Additional Comments:

## 2013-07-12 NOTE — Clinical Social Work Psychosocial (Signed)
Clinical Social Work Department BRIEF PSYCHOSOCIAL ASSESSMENT 07/12/2013  Patient:  [name], FEHRMAN     Account Number:  [id number]     Admit date:  07/03/2013  Clinical Social Worker:  Delmer Islam  Date/Time:  07/12/2013 06:52 AM  Referred by:  Physician  Date Referred:  07/12/2013 Referred for  SNF Placement   Other Referral:   Interview type:  Patient Other interview type:    PSYCHOSOCIAL DATA Living Status:  ALONE Admitted from facility:   Level of care:   Primary support name:  Effie Shy Primary support relationship to patient:  CHILD, ADULT Degree of support available:   Per patient son is very concerned about her and wants her to live with he and his family in [place], [place].    CURRENT CONCERNS Current Concerns  Post-Acute Placement   Other Concerns:    SOCIAL WORK ASSESSMENT / PLAN CSW introduced self and stated purpose of visit - to discuss recommendation of ST rehab. Patient is in agreement and reported that she has been to [facility] before and wants to return there.    Patient talked with CSW about her son and his family and reported that they really want her to move in with them. Per patient, they have wanted this since her husband died in 05/10/1999 but she was determined to be independent. Patient stated that her son and daughter-in-law are more insistent now.   Assessment/plan status:  Psychosocial Support/Ongoing Assessment of Needs Other assessment/ plan:   Information/referral to community resources:    PATIENT'S/FAMILY'S RESPONSE TO PLAN OF CARE: Patient appreciative of CSW's assistance with placement for ST rehab.

## 2013-07-12 NOTE — Progress Notes (Signed)
Peripherally Inserted Central Catheter/Midline Placement  The IV Nurse has discussed with the patient and/or persons authorized to consent for the patient, the purpose of this procedure and the potential benefits and risks involved with this procedure.  The benefits include less needle sticks, lab draws from the catheter and patient may be discharged home with the catheter.  Risks include, but not limited to, infection, bleeding, blood clot (thrombus formation), and puncture of an artery; nerve damage and irregular heat beat.  Alternatives to this procedure were also discussed.  PICC/Midline Placement Documentation  PICC / Midline Single Lumen 07/12/13 PICC Right Basilic (Active)  Indication for Insertion or Continuance of Line Prolonged intravenous therapies 07/12/2013  4:25 PM  Length mark (cm) 1 cm 07/12/2013  4:25 PM  Site Assessment Clean;Dry;Intact 07/12/2013  4:25 PM  Line Status Flushed;Saline locked;Blood return noted 07/12/2013  4:25 PM  Dressing Type Transparent 07/12/2013  4:25 PM  Dressing Status Clean;Dry;Intact;Antimicrobial disc in place 07/12/2013  4:25 PM  Dressing Change Due 07/19/13 07/12/2013  4:25 PM       Britten Seyfried, Lajean Manes 07/12/2013, 4:45 PM

## 2013-07-15 LAB — GLUCOSE, CAPILLARY: Glucose-Capillary: 139 mg/dL — ABNORMAL HIGH (ref 70–99)

## 2013-07-16 ENCOUNTER — Encounter: Payer: Self-pay | Admitting: Surgery

## 2013-07-26 ENCOUNTER — Ambulatory Visit: Payer: Self-pay | Admitting: Neurology

## 2013-07-31 ENCOUNTER — Ambulatory Visit (INDEPENDENT_AMBULATORY_CARE_PROVIDER_SITE_OTHER): Payer: Medicare Other | Admitting: Infectious Diseases

## 2013-07-31 ENCOUNTER — Encounter: Payer: Self-pay | Admitting: Infectious Diseases

## 2013-07-31 VITALS — BP 183/79 | HR 81 | Temp 98.0°F | Ht 62.0 in | Wt 165.0 lb

## 2013-07-31 DIAGNOSIS — I10 Essential (primary) hypertension: Secondary | ICD-10-CM

## 2013-07-31 DIAGNOSIS — M869 Osteomyelitis, unspecified: Secondary | ICD-10-CM

## 2013-07-31 DIAGNOSIS — E119 Type 2 diabetes mellitus without complications: Secondary | ICD-10-CM

## 2013-07-31 DIAGNOSIS — E1169 Type 2 diabetes mellitus with other specified complication: Secondary | ICD-10-CM

## 2013-07-31 DIAGNOSIS — M9080 Osteopathy in diseases classified elsewhere, unspecified site: Secondary | ICD-10-CM

## 2013-07-31 NOTE — Progress Notes (Addendum)
RN received verbal order to discontinue the patient's PICC line.  Patient identified with name and date of birth. PICC dressing removed, site unremarkable.  PICC line removed using sterile procedure @ 0945. PICC length equal to that noted in patient's hospital chart of 38 cm. Sterile petroleum gauze + sterile 4X4 applied to PICC site, pressure applied for 10 minutes and covered with Medipore tape as a pressure dressing. Patient tolerated procedure without complaints.  Patient instructed to limit use of arm for 1 hour. Patient instructed that the pressure dressing should remain in place for 24 hours. Patient verbalized understanding of these instructions.

## 2013-07-31 NOTE — Progress Notes (Signed)
  Subjective:    Patient ID: [name], female    DOB: 1931/08/04, 79 y.o.   MRN: [id number]  HPI 79 yo F with DM, CABG 1997, and vascular dz who was adm to [facility] 07-03-13 to 07-12-13 with cellulitis of her R foot and a diabetic foot ulcer. The ulcer has been present for the last 4 months, has been seen at wound center (Dr Marcha Solders). She had plain films that were (-) for osteo. She had an ESR of 50. She was treated with clinda/aztreonam in the hospital and then d/c to SNF on same. These were completed on 07-30-13.  Has vasc f/u on 08-05-13.  Wound has been about the same. Has had d/c, yellow. Erythema in foot has improved.   Per family has 100% blockage of circulation at distal R foot, 60% L foot.   R great toe amputation 2002 .  Review of Systems No loose BM from anbx, no prob with urination, no f/c, eating well (not getting diabetic diet), FSG have "been wild for a year", +neuropathy, no problems with PIC. Has ophtho f/u every 4 months. See HPI    Objective:   Physical Exam  Constitutional: She appears well-developed and well-nourished.  HENT:  Mouth/Throat: Abnormal dentition. No oropharyngeal exudate.  Eyes: EOM are normal. Pupils are equal, round, and reactive to light.  Neck: Neck supple.  Cardiovascular: Normal rate, regular rhythm and normal heart sounds.   Pulmonary/Chest: Effort normal and breath sounds normal.  Abdominal: Soft. Bowel sounds are normal. She exhibits no distension. There is no tenderness.  Musculoskeletal:       Arms:      Feet:  Lymphadenopathy:    She has no cervical adenopathy.          Assessment & Plan:

## 2013-07-31 NOTE — Assessment & Plan Note (Signed)
She will f/u with her PCP.  

## 2013-07-31 NOTE — Assessment & Plan Note (Signed)
She needs better control and needs to be on a diabetic diet.

## 2013-07-31 NOTE — Assessment & Plan Note (Addendum)
She appears to be doing ok. We will pull her PIC out today. She has completed her anbx. She has f/u with vascular in 5 days and is hopeful that circulation can be improved in her RLE. She/her family will call to have a repeat appt with her wound care MD. There are no good oral options for her as she has multiple drug allergies. We spoke that the keys to her healing are good wound care, diabetic control and keeping her feet elevated. Will see her back in 6 weeks. I have asked them to call in the intervening period if she has any problems (increased erythema, d/c, tenderness).

## 2013-08-02 ENCOUNTER — Encounter: Payer: Self-pay | Admitting: Surgery

## 2013-08-05 ENCOUNTER — Other Ambulatory Visit: Payer: Self-pay | Admitting: *Deleted

## 2013-08-05 ENCOUNTER — Encounter (INDEPENDENT_AMBULATORY_CARE_PROVIDER_SITE_OTHER): Payer: Medicare Other | Admitting: Vascular Surgery

## 2013-08-05 ENCOUNTER — Ambulatory Visit (INDEPENDENT_AMBULATORY_CARE_PROVIDER_SITE_OTHER): Payer: Medicare Other | Admitting: Surgery

## 2013-08-05 ENCOUNTER — Encounter: Payer: Self-pay | Admitting: *Deleted

## 2013-08-05 ENCOUNTER — Encounter: Payer: Self-pay | Admitting: Surgery

## 2013-08-05 VITALS — BP 184/80 | HR 81 | Ht 62.0 in | Wt 164.1 lb

## 2013-08-05 DIAGNOSIS — Z48812 Encounter for surgical aftercare following surgery on the circulatory system: Secondary | ICD-10-CM

## 2013-08-05 DIAGNOSIS — Z01810 Encounter for preprocedural cardiovascular examination: Secondary | ICD-10-CM

## 2013-08-05 DIAGNOSIS — I739 Peripheral vascular disease, unspecified: Secondary | ICD-10-CM

## 2013-08-05 NOTE — Progress Notes (Signed)
[facility] [place]   Patient name: [name] MRN: [id number] DOB: 10/13/1931 Sex: female     Chief Complaint  Patient presents with  . Re-evaluation    3 wk f/u s/p angioplasty, rt superficial fem artery    HISTORY OF PRESENT ILLNESS: I initially saw the patient approximately one month ago for evaluation of a right foot ulcer on the lateral side of her fifth metatarsal. It had been present for approximately 3 months. She was being treated at the wound center with serial debridements. She was scheduled for angiogram to evaluate her blood flow, however before this occurred she was admitted to the hospital for worsening cellulitis. She is found to have osteomyelitis. She also had expanding erythema going up her foot. She did improve with IV antibiotics. She underwent arteriogram on 07/09/2013. I was able to stand a diseased right superficial femoral artery. She had a tibioperoneal trunk occlusion which could not be recanalized. She is back today for followup  Past Medical History  Diagnosis Date  . Essential hypertension, benign   . Gout   . Ischemic cardiomyopathy   . Systolic and diastolic CHF, chronic     Echo 04/05/12 EF 40-45%, anterolateral/posterior hypokinesis, grade 1 diastolic dysfunction, mild MR, mildly dilated LA  . Hyperlipidemia   . Coronary atherosclerosis of native coronary artery     s/p 4V CABG '97; NSTEMI -->Cardiac cath 04/03/12 revealed a new occlusion of SVG to OM system, Medical therapy (asa, plavix, statin, ranexa).  . Diabetes mellitus, type 2   . Seizures   . Arthritis   . Osteoporosis   . Retinal hemorrhage, both eyes   . Stroke     1980 and then 2 strokes 2012  . GERD (gastroesophageal reflux disease)     Past Surgical History  Procedure Laterality Date  . Coronary artery bypass graft      1997  . Knee surgery      bilateral  . Abdominal hysterectomy    . Cardiac catheterization      11/2010-patent grafts  . Right foot  surgery      big toe removed and other surgeries on rt foot because of infection  . Left hip hemi-arthroplasty  07-31-2011    SURGERY AT [facility] FOR FRACTURE LEFT FEMORAL NECK  . Total hip revision  01/23/2012    Procedure: TOTAL HIP REVISION;  Surgeon: Matthew D Olin, MD;  Location: [facility] ORS;  Service: Orthopedics;  Laterality: Left;  Conversion of  Previous Surgery to a Left Total Hip  . Abdominal hysterectomy    . Joint replacement  Jan. 2013    Left Hip    History   Social History  . Marital Status: Widowed    Spouse Name: N/A    Number of Children: N/A  . Years of Education: N/A   Occupational History  . Not on file.   Social History Main Topics  . Smoking status: Never Smoker   . Smokeless tobacco: Never Used  . Alcohol Use: No  . Drug Use: No  . Sexually Active: Not Currently    Birth Control/ Protection: Post-menopausal   Other Topics Concern  . Not on file   Social History Narrative  . No narrative on file    Family History  Problem Relation Age of Onset  . Heart attack Father 67  . Heart disease Father     Allergies as of 08/05/2013 - Review Complete 08/05/2013  Allergen Reaction Noted  . Ace inhibitors    10/20/2011  . Cephalexin  01/18/2012  . Furosemide Other (See Comments) 04/13/2011  . Levofloxacin Swelling 10/20/2011  . Vancomycin Hives and Itching 07/11/2013  . Bactrim Rash 04/13/2011  . Ciprocin-fluocin-procin (fluocinolone acetonide) Rash 04/13/2011  . Ciprofloxacin Rash 01/11/2012  . Codeine Rash 04/13/2011  . Morphine and related Rash 04/13/2011  . Pentazocine Rash 04/13/2011  . Promethazine Swelling and Rash 01/11/2012  . Sulfa antibiotics Swelling and Rash 01/11/2012  . Trimethoprim Swelling and Rash 01/11/2012    Current Outpatient Prescriptions on File Prior to Visit  Medication Sig Dispense Refill  . acetaminophen (TYLENOL) 325 MG tablet Take 1-2 tablets (325-650 mg total) by mouth every 4 (four) hours as needed.      . allopurinol  (ZYLOPRIM) 300 MG tablet Take 300 mg by mouth every morning.       . aspirin EC 81 MG tablet Take 81 mg by mouth at bedtime.       . atorvastatin (LIPITOR) 80 MG tablet Take 80 mg by mouth at bedtime.       . Biotin 1000 MCG tablet Take 1,000 mcg by mouth daily.      . Calcium Citrate (CITRACAL PO) Take 2 tablets by mouth every morning.      . carvedilol (COREG) 12.5 MG tablet Take 0.5 tablets (6.25 mg total) by mouth 2 (two) times daily with a meal.  60 tablet  0  . cholecalciferol (VITAMIN D) 1000 UNITS tablet Take 1,000 Units by mouth every morning.       . clopidogrel (PLAVIX) 75 MG tablet Take 1 tablet by mouth every morning.       . ferrous sulfate 325 (65 FE) MG tablet Take 325 mg by mouth 2 (two) times daily.       . glyBURIDE (DIABETA) 5 MG tablet Take 10 mg by mouth 2 (two) times daily with a meal.       . Glycerin-Hypromellose-PEG 400 (VISINE TEARS OP) Place 1 drop into both eyes daily as needed. For dry eyes      . isosorbide mononitrate (IMDUR) 30 MG 24 hr tablet Take 30 mg by mouth every morning.       . lamoTRIgine (LAMICTAL) 100 MG tablet Take 100 mg by mouth 2 (two) times daily.       . LANTUS SOLOSTAR 100 UNIT/ML SOPN Inject 18 Units into the skin every morning.       . meclizine (ANTIVERT) 12.5 MG tablet Take 12.5 mg by mouth 3 (three) times daily as needed for dizziness. For inner ear/dizziness      . nitroGLYCERIN (NITROSTAT) 0.4 MG SL tablet Place 0.4 mg under the tongue every 5 (five) minutes x 3 doses as needed for chest pain (up to 3 doses).      . omeprazole (PRILOSEC) 20 MG capsule Take 20 mg by mouth 2 (two) times daily.       . SANTYL ointment Apply 1 application topically daily.       . diphenhydramine-acetaminophen (TYLENOL PM) 25-500 MG TABS Take 1 tablet by mouth at bedtime.       . QC PEN NEEDLES 31G X 6 MM MISC 3 (three) times daily.       No current facility-administered medications on file prior to visit.     REVIEW OF SYSTEMS: Please see history of  present illness, otherwise no change from prior visit  PHYSICAL EXAMINATION:   Vital signs are BP 184/80  Pulse 81  Ht 5' 2" (1.575 m)  Wt 164 lb   1.6 oz (74.435 kg)  BMI 30.01 kg/m2  SpO2 99% General: The patient appears their stated age. HEENT:  No gross abnormalities Pulmonary:  Non labored breathing Musculoskeletal: There are no major deformities. Neurologic: No focal weakness or paresthesias are detected, Skin: The ulcer remains stable size. Maybe slightly more superficial. The erythema has resolved. Psychiatric: The patient has normal affect. Cardiovascular: Pedal pulses are nonpalpable   Diagnostic Studies Ultrasound was ordered and reviewed today. Her ABI today is 0.79 on the right. This has increased from 0.62. On the left it remained stable at 0.86. Waveforms are monophasic on the right and triphasic on the left.  Assessment: Right foot ulcer. Plan: I feel the patient's foot looks much better than it did last time I saw her. I have recommended continuation of local wound care. She has completed a 5 week course of antibiotics. I have scheduled her for repeat attempt at recannulization of her right tibioperoneal trunk. This is been scheduled for Tuesday, August 26  V. Wells Beth Goodlin IV, M.D. [facility] [place] Office: [phone] Pager:  [phone]   

## 2013-08-12 ENCOUNTER — Ambulatory Visit: Payer: Medicare Other | Admitting: Surgery

## 2013-08-14 ENCOUNTER — Ambulatory Visit (INDEPENDENT_AMBULATORY_CARE_PROVIDER_SITE_OTHER): Payer: Medicare Other | Admitting: Cardiology

## 2013-08-14 ENCOUNTER — Encounter: Payer: Self-pay | Admitting: Cardiology

## 2013-08-14 ENCOUNTER — Encounter (HOSPITAL_COMMUNITY): Payer: Self-pay | Admitting: Pharmacy Technician

## 2013-08-14 VITALS — BP 134/72 | HR 79 | Ht 62.0 in | Wt 160.5 lb

## 2013-08-14 DIAGNOSIS — I779 Disorder of arteries and arterioles, unspecified: Secondary | ICD-10-CM

## 2013-08-14 DIAGNOSIS — I743 Embolism and thrombosis of arteries of the lower extremities: Secondary | ICD-10-CM

## 2013-08-14 DIAGNOSIS — I2510 Atherosclerotic heart disease of native coronary artery without angina pectoris: Secondary | ICD-10-CM

## 2013-08-14 DIAGNOSIS — I10 Essential (primary) hypertension: Secondary | ICD-10-CM

## 2013-08-14 NOTE — Assessment & Plan Note (Signed)
Weight is down 5 pounds, symptomatically stable with LVEF 40-45%.

## 2013-08-14 NOTE — Assessment & Plan Note (Signed)
Symptomatically stable on medical therapy. No changes were made to her regimen today. Followup arranged.

## 2013-08-14 NOTE — Assessment & Plan Note (Signed)
Blood pressure control is reasonable today. 

## 2013-08-14 NOTE — Patient Instructions (Addendum)
Your physician recommends that you schedule a follow-up appointment in: 4 MONTHS IN THE [facility] OFFICE

## 2013-08-14 NOTE — Progress Notes (Signed)
Clinical Summary Ms. Cocking is an 79 y.o.female last seen in June. Since last visit she has seen ID, PICC line removed with completion of antibiotics for cellulitis and osteomyelitis. Also evaluated by Dr. Myra Gianotti and has undergone peripheral angiography, finding the tibioperoneal trunk occlusion which could not be cannulated at the time. Followup procedure is planned for later this month.  Recent lab work in July showed potassium 5.0, BUN 37, creatinine 1.1.  Still has some right-sided foot pain, fortunately no progressive angina symptoms or breathlessness. She states she has used one nitroglycerin pill since I last saw her. She states she has been compliant with her medications. Weight is down approximately 5 pounds from earlier in the month.   Allergies  Allergen Reactions  . Ace Inhibitors     Hyperkalemia  . Cephalexin     Reaction unknown-but on pt's list of medications that she was told she was very allergic to.  . Furosemide Other (See Comments)    presyncope  . Levofloxacin Swelling  . Vancomycin Hives and Itching  . Bactrim Rash  . Ciprocin-Fluocin-Procin [Fluocinolone Acetonide] Rash  . Ciprofloxacin Rash  . Codeine Rash  . Morphine And Related Rash  . Pentazocine Rash  . Promethazine Swelling and Rash  . Sulfa Antibiotics Swelling and Rash  . Trimethoprim Swelling and Rash    Current Outpatient Prescriptions  Medication Sig Dispense Refill  . acetaminophen (TYLENOL) 325 MG tablet Take 1-2 tablets (325-650 mg total) by mouth every 4 (four) hours as needed.      Marland Kitchen allopurinol (ZYLOPRIM) 300 MG tablet Take 300 mg by mouth every morning.       Marland Kitchen aspirin EC 81 MG tablet Take 81 mg by mouth at bedtime.       Marland Kitchen atorvastatin (LIPITOR) 80 MG tablet Take 80 mg by mouth at bedtime.       . Biotin 1000 MCG tablet Take 1,000 mcg by mouth daily.      . Calcium Citrate (CITRACAL PO) Take 2 tablets by mouth every morning.      . carvedilol (COREG) 12.5 MG tablet Take 0.5 tablets  (6.25 mg total) by mouth 2 (two) times daily with a meal.  60 tablet  0  . cholecalciferol (VITAMIN D) 1000 UNITS tablet Take 1,000 Units by mouth every morning.       . clopidogrel (PLAVIX) 75 MG tablet Take 1 tablet by mouth every morning.       . diphenhydramine-acetaminophen (TYLENOL PM) 25-500 MG TABS Take 1 tablet by mouth at bedtime.       . ferrous sulfate 325 (65 FE) MG tablet Take 325 mg by mouth 2 (two) times daily.       Marland Kitchen glyBURIDE (DIABETA) 5 MG tablet Take 10 mg by mouth 2 (two) times daily with a meal.       . Glycerin-Hypromellose-PEG 400 (VISINE TEARS OP) Place 1 drop into both eyes daily as needed. For dry eyes      . isosorbide mononitrate (IMDUR) 30 MG 24 hr tablet Take 30 mg by mouth every morning.       . lamoTRIgine (LAMICTAL) 100 MG tablet Take 100 mg by mouth 2 (two) times daily.       Marland Kitchen LANTUS SOLOSTAR 100 UNIT/ML SOPN Inject 18 Units into the skin every morning.       . meclizine (ANTIVERT) 12.5 MG tablet Take 12.5 mg by mouth 3 (three) times daily as needed for dizziness. For inner ear/dizziness      .  nitroGLYCERIN (NITROSTAT) 0.4 MG SL tablet Place 0.4 mg under the tongue every 5 (five) minutes x 3 doses as needed for chest pain (up to 3 doses).      Marland Kitchen omeprazole (PRILOSEC) 20 MG capsule Take 20 mg by mouth 2 (two) times daily.       . Probiotic Product (PROBIOTIC PO) Take 1 tablet by mouth 2 (two) times daily.      . QC PEN NEEDLES 31G X 6 MM MISC 3 (three) times daily.      Marland Kitchen SANTYL ointment Apply 1 application topically daily.       Marland Kitchen torsemide (DEMADEX) 20 MG tablet Take 1 tablet by mouth daily.      . vitamin C (ASCORBIC ACID) 500 MG tablet Take 500 mg by mouth daily.       No current facility-administered medications for this visit.    Past Medical History  Diagnosis Date  . Essential hypertension, benign   . Gout   . Ischemic cardiomyopathy   . Systolic and diastolic CHF, chronic     Echo 04/05/12 EF 40-45%, anterolateral/posterior hypokinesis, grade 1  diastolic dysfunction, mild MR, mildly dilated LA  . Hyperlipidemia   . Coronary atherosclerosis of native coronary artery     s/p 4V CABG '97; NSTEMI -->Cardiac cath 04/03/12 revealed a new occlusion of SVG to OM system, Medical therapy (asa, plavix, statin, ranexa).  . Diabetes mellitus, type 2   . Seizures   . Arthritis   . Osteoporosis   . Retinal hemorrhage, both eyes   . Stroke     1980 and then 2 strokes 2012  . GERD (gastroesophageal reflux disease)     Social History Ms. Reid reports that she has never smoked. She has never used smokeless tobacco. Ms. Hadlock reports that she does not drink alcohol.  Review of Systems No palpitations, no orthopnea or PND. No active bleeding problems on DAPT.  Physical Examination Filed Vitals:   08/14/13 1033  BP: 134/72  Pulse: 79   Filed Weights   08/14/13 1033  Weight: 160 lb 8 oz (72.802 kg)    Elderly woman, comfortable at rest.  HEENT: Conjunctiva and lids normal, oropharynx clear.  Neck: Supple, no elevated JVP or carotid bruits, no thyromegaly.  Lungs: Clear to auscultation, diminished, nonlabored breathing at rest.  Cardiac: Regular rate and rhythm, no S3, 2/6 systolic murmur, no pericardial rub.  Abdomen: Soft, nontender, bowel sounds present. Extremities: 1+ edema, right foot dressed with open support shoe.   Problem List and Plan   Coronary atherosclerosis of native coronary artery Symptomatically stable on medical therapy. No changes were made to her regimen today. Followup arranged.  Cardiomyopathy secondary Weight is down 5 pounds, symptomatically stable with LVEF 40-45%.  Essential hypertension, benign Blood pressure control is reasonable today.  Peripheral arterial occlusive disease For repeat peripheral angiography per Dr. Myra Gianotti next week.    Jonelle Sidle, M.D., F.A.C.C.

## 2013-08-14 NOTE — Assessment & Plan Note (Signed)
For repeat peripheral angiography per Dr. Myra Gianotti next week.

## 2013-08-20 ENCOUNTER — Encounter (HOSPITAL_COMMUNITY)
Admission: RE | Disposition: A | Discharge status: Home / Self Care | Payer: Self-pay | Source: Ambulatory Visit | Attending: Surgery

## 2013-08-20 ENCOUNTER — Telehealth: Payer: Self-pay | Admitting: Surgery

## 2013-08-20 ENCOUNTER — Ambulatory Visit (HOSPITAL_COMMUNITY)
Admission: RE | Admit: 2013-08-20 | Discharge: 2013-08-20 | Disposition: A | Discharge status: Home / Self Care | Payer: Medicare Other | Source: Ambulatory Visit | Attending: Surgery | Admitting: Surgery

## 2013-08-20 DIAGNOSIS — I748 Embolism and thrombosis of other arteries: Secondary | ICD-10-CM | POA: Insufficient documentation

## 2013-08-20 DIAGNOSIS — L97509 Non-pressure chronic ulcer of other part of unspecified foot with unspecified severity: Secondary | ICD-10-CM | POA: Insufficient documentation

## 2013-08-20 DIAGNOSIS — I739 Peripheral vascular disease, unspecified: Secondary | ICD-10-CM

## 2013-08-20 DIAGNOSIS — L03039 Cellulitis of unspecified toe: Secondary | ICD-10-CM | POA: Insufficient documentation

## 2013-08-20 DIAGNOSIS — L98499 Non-pressure chronic ulcer of skin of other sites with unspecified severity: Secondary | ICD-10-CM

## 2013-08-20 DIAGNOSIS — L02619 Cutaneous abscess of unspecified foot: Secondary | ICD-10-CM | POA: Insufficient documentation

## 2013-08-20 LAB — POCT I-STAT, CHEM 8
Calcium, Ion: 1.18 mmol/L (ref 1.13–1.30)
Chloride: 102 mEq/L (ref 96–112)
Glucose, Bld: 229 mg/dL — ABNORMAL HIGH (ref 70–99)
HCT: 41 % (ref 36.0–46.0)
Hemoglobin: 13.9 g/dL (ref 12.0–15.0)

## 2013-08-20 LAB — POCT ACTIVATED CLOTTING TIME
Activated Clotting Time: 253 seconds
Activated Clotting Time: 222 seconds
Activated Clotting Time: 181 seconds

## 2013-08-20 SURGERY — PTA PERIPHERAL ARTERY
Laterality: Right

## 2013-08-20 MED ORDER — ACETAMINOPHEN 325 MG RE SUPP: 325.0000 mg | RECTAL | Status: DC | PRN

## 2013-08-20 MED ORDER — LIDOCAINE HCL (PF) 1 % IJ SOLN
INTRAMUSCULAR | Status: AC
  Filled 2013-08-20: qty 30

## 2013-08-20 MED ORDER — HYDRALAZINE HCL 20 MG/ML IJ SOLN
INTRAMUSCULAR | Status: AC
  Filled 2013-08-20: qty 1

## 2013-08-20 MED ORDER — FENTANYL CITRATE 0.05 MG/ML IJ SOLN
INTRAMUSCULAR | Status: AC
  Filled 2013-08-20: qty 2

## 2013-08-20 MED ORDER — SODIUM CHLORIDE 0.9 % IV SOLN
INTRAVENOUS | Status: DC
  Administered 2013-08-20: 100 mL/h via INTRAVENOUS

## 2013-08-20 MED ORDER — HYDRALAZINE HCL 20 MG/ML IJ SOLN
10.0000 mg | INTRAMUSCULAR | Status: DC | PRN
  Administered 2013-08-20: 10 mg via INTRAVENOUS

## 2013-08-20 MED ORDER — ONDANSETRON HCL 4 MG/2ML IJ SOLN: 4.0000 mg | Freq: Four times a day (QID) | INTRAMUSCULAR | Status: DC | PRN

## 2013-08-20 MED ORDER — METOPROLOL TARTRATE 1 MG/ML IV SOLN: 2.0000 mg | INTRAVENOUS | Status: DC | PRN

## 2013-08-20 MED ORDER — SODIUM CHLORIDE 0.9 % IV SOLN: 1.0000 mL/kg/h | INTRAVENOUS | Status: DC

## 2013-08-20 MED ORDER — HEPARIN (PORCINE) IN NACL 2-0.9 UNIT/ML-% IJ SOLN
INTRAMUSCULAR | Status: AC
  Filled 2013-08-20: qty 1000

## 2013-08-20 MED ORDER — MIDAZOLAM HCL 2 MG/2ML IJ SOLN
INTRAMUSCULAR | Status: AC
  Filled 2013-08-20: qty 2

## 2013-08-20 MED ORDER — LABETALOL HCL 5 MG/ML IV SOLN: 10.0000 mg | INTRAVENOUS | Status: DC | PRN

## 2013-08-20 MED ORDER — HEPARIN SODIUM (PORCINE) 1000 UNIT/ML IJ SOLN
INTRAMUSCULAR | Status: AC
  Filled 2013-08-20: qty 1

## 2013-08-20 MED ORDER — GUAIFENESIN-DM 100-10 MG/5ML PO SYRP: 15.0000 mL | ORAL_SOLUTION | ORAL | Status: DC | PRN

## 2013-08-20 MED ORDER — ACETAMINOPHEN 325 MG PO TABS: 325.0000 mg | ORAL_TABLET | ORAL | Status: DC | PRN

## 2013-08-20 NOTE — Telephone Encounter (Addendum)
Message copied by Fredrich Birks on Tue Aug 20, 2013  4:00 PM ------      Message from: Melene Plan      Created: Tue Aug 20, 2013  3:20 PM                   ----- Message -----         From: Nada Libman, MD         Sent: 08/20/2013   1:39 PM           To: Reuel Derby, Melene Plan, RN            08/20/2013:                  Surgeon:  Jorge Ny      Procedure Performed:       1.  ultrasound-guided access, left femoral artery       2.  right lower extremity runoff       3.  additional order catheterization x2 (anterior tibial artery, tibial peroneal trunk, peroneal artery)       4.  failed angioplasty, right tibial peroneal trunk and peroneal artery       5.  angioplasty right anterior tibial artery       6.  angioplasty right dorsalis pedis artery                  Schedule for followup in my office in one month with a ABI and duplex of the right leg ------  08/20/13: lm for patient, dpm

## 2013-08-20 NOTE — Interval H&P Note (Signed)
History and Physical Interval Note:  08/20/2013 10:50 AM  Sherral Hammers  has presented today for surgery, with the diagnosis of claudication  The various methods of treatment have been discussed with the patient and family. After consideration of risks, benefits and other options for treatment, the patient has consented to  Procedure(s): ABDOMINAL AORTAGRAM (N/A) as a surgical intervention .  The patient's history has been reviewed, patient examined, no change in status, stable for surgery.  I have reviewed the patient's chart and labs.  Questions were answered to the patient's satisfaction.     BRABHAM IV, V. WELLS

## 2013-08-20 NOTE — Op Note (Signed)
[facility] [place]  Patient name: [name] MRN: [id number] DOB: Sep 23, 1931 Sex: female  08/20/2013 Pre-operative Diagnosis: Right foot ulcer Post-operative diagnosis:  Same Surgeon:  Jorge Ny Procedure Performed:  1.  ultrasound-guided access, left femoral artery  2.  right lower extremity runoff  3.  additional order catheterization x2 (anterior tibial artery, tibial peroneal trunk, peroneal artery)  4.  failed angioplasty, right tibial peroneal trunk and peroneal artery  5.  angioplasty right anterior tibial artery  6.  angioplasty right dorsalis pedis artery    Indications:  The patient has had a right foot ulcer which has not healed. She previously had undergone an attempt at recanalization of an occluded tibial peroneal trunk which was unsuccessful. She comes back for repeat   Procedure:  The patient was identified in the holding area and taken to room 8.  The patient was then placed supine on the table and prepped and draped in the usual sterile fashion.  A time out was called.  Ultrasound was used to evaluate the left common femoral artery.  It was patent .  A digital ultrasound image was acquired.  A micropuncture needle was used to access the left common femoral artery under ultrasound guidance.  An 018 wire was advanced without resistance and a micropuncture sheath was placed.  The 018 wire was removed and a benson wire was placed.  The micropuncture sheath was exchanged for a 6 French 45 cm Ansel 1 sheath. The Omni flush catheter was used across the aortic bifurcation. A 035 wire was advanced into the right external iliac artery. The Omni flush catheter was removed and the dilator was replaced so that the sheath could be advanced over the bifurcation. Right lower extremity runoff was performed.  Findings:   Right Lower Extremity:  The right common femoral, profundofemoral artery are widely patent. There is diffuse disease throughout the right  superficial femoral and popliteal artery without a hemodynamically significant lesion. The anterior tibial artery is occluded just beyond its origin. The tibioperoneal trunk is occluded. There is reconstitution of the peroneal artery which is the main blood flow out onto the foot. There is distal reconstitution of the dorsalis pedis artery.   Intervention:  After the above images were acquired, the decision was made to proceed with intervention.The patient was fully heparinized. The sheath was then manipulated into the superficial femoral artery. I then used a 014 miracle brothers wire to get down to the below knee popliteal artery. A 038 straight catheter was then advanced through the catheter, the Viance crossing device was used to attempt to recanalization of the tibioperoneal trunk and peroneal artery. I was able to advance the catheter down to what I thought was the peroneal artery however I could not get free entry into the artery. After multiple attempts with different wires I felt that this was not successful again. I then elected to attempt to cross the occlusion in the anterior tibial artery. Again using a pro-water wire and the Viance catheter were used to cross the occlusion. Reentry was in the dorsalis pedis artery. I then performed a primary balloon angioplasty of the occluded segment with a 3 mm a Fox SV balloon. Each inflation was taken to 12 atmospheres and held up for 2 minutes. Completion angiography revealed excellent result with a widely patent anterior tibial artery and excellent perfusion out onto the foot. After the above images were acquired, the decision was made to terminate the procedure. Catheters and wires  were removed. The patient be taken the holding area for sheath pull once her coagulation profile corrects. The sheath was withdrawn to the left external iliac artery    Impression:  #1  failed attempt at recanalization of the occluded tibioperoneal trunk  #2  successful  subintimal recanalization of the anterior tibial artery with balloon angioplasty of the dorsalis pedis and anterior tibial artery using a 3 mm balloon    V. Durene Cal, M.D. [facility] [place] Office: [phone] Pager:  [phone]

## 2013-08-20 NOTE — H&P (View-Only) (Signed)
[facility] [place]   Patient name: [name] MRN: [id number] DOB: 26-Aug-1931 Sex: female     Chief Complaint  Patient presents with  . Re-evaluation    3 wk f/u s/p angioplasty, rt superficial fem artery    HISTORY OF PRESENT ILLNESS: I initially saw the patient approximately one month ago for evaluation of a right foot ulcer on the lateral side of her fifth metatarsal. It had been present for approximately 3 months. She was being treated at the wound center with serial debridements. She was scheduled for angiogram to evaluate her blood flow, however before this occurred she was admitted to the hospital for worsening cellulitis. She is found to have osteomyelitis. She also had expanding erythema going up her foot. She did improve with IV antibiotics. She underwent arteriogram on 07/09/2013. I was able to stand a diseased right superficial femoral artery. She had a tibioperoneal trunk occlusion which could not be recanalized. She is back today for followup  Past Medical History  Diagnosis Date  . Essential hypertension, benign   . Gout   . Ischemic cardiomyopathy   . Systolic and diastolic CHF, chronic     Echo 04/05/12 EF 40-45%, anterolateral/posterior hypokinesis, grade 1 diastolic dysfunction, mild MR, mildly dilated LA  . Hyperlipidemia   . Coronary atherosclerosis of native coronary artery     s/p 4V CABG '97; NSTEMI -->Cardiac cath 04/03/12 revealed a new occlusion of SVG to OM system, Medical therapy (asa, plavix, statin, ranexa).  . Diabetes mellitus, type 2   . Seizures   . Arthritis   . Osteoporosis   . Retinal hemorrhage, both eyes   . Stroke     1980 and then 2 strokes 2012  . GERD (gastroesophageal reflux disease)     Past Surgical History  Procedure Laterality Date  . Coronary artery bypass graft      1997  . Knee surgery      bilateral  . Abdominal hysterectomy    . Cardiac catheterization      11/2010-patent grafts  . Right foot  surgery      big toe removed and other surgeries on rt foot because of infection  . Left hip hemi-arthroplasty  07-31-2011    SURGERY AT [facility] FOR FRACTURE LEFT FEMORAL NECK  . Total hip revision  01/23/2012    Procedure: TOTAL HIP REVISION;  Surgeon: Shelda Pal, MD;  Location: [facility];  Service: Orthopedics;  Laterality: Left;  Conversion of  Previous Surgery to a Left Total Hip  . Abdominal hysterectomy    . Joint replacement  Jan. 2013    Left Hip    History   Social History  . Marital Status: Widowed    Spouse Name: N/A    Number of Children: N/A  . Years of Education: N/A   Occupational History  . Not on file.   Social History Main Topics  . Smoking status: Never Smoker   . Smokeless tobacco: Never Used  . Alcohol Use: No  . Drug Use: No  . Sexually Active: Not Currently    Birth Control/ Protection: Post-menopausal   Other Topics Concern  . Not on file   Social History Narrative  . No narrative on file    Family History  Problem Relation Age of Onset  . Heart attack Father 57  . Heart disease Father     Allergies as of 08/05/2013 - Review Complete 08/05/2013  Allergen Reaction Noted  . Ace inhibitors  10/20/2011  . Cephalexin  01/18/2012  . Furosemide Other (See Comments) 04/13/2011  . Levofloxacin Swelling 10/20/2011  . Vancomycin Hives and Itching 07/11/2013  . Bactrim Rash 04/13/2011  . Ciprocin-fluocin-procin (fluocinolone acetonide) Rash 04/13/2011  . Ciprofloxacin Rash 01/11/2012  . Codeine Rash 04/13/2011  . Morphine and related Rash 04/13/2011  . Pentazocine Rash 04/13/2011  . Promethazine Swelling and Rash 01/11/2012  . Sulfa antibiotics Swelling and Rash 01/11/2012  . Trimethoprim Swelling and Rash 01/11/2012    Current Outpatient Prescriptions on File Prior to Visit  Medication Sig Dispense Refill  . acetaminophen (TYLENOL) 325 MG tablet Take 1-2 tablets (325-650 mg total) by mouth every 4 (four) hours as needed.      Marland Kitchen allopurinol  (ZYLOPRIM) 300 MG tablet Take 300 mg by mouth every morning.       Marland Kitchen aspirin EC 81 MG tablet Take 81 mg by mouth at bedtime.       Marland Kitchen atorvastatin (LIPITOR) 80 MG tablet Take 80 mg by mouth at bedtime.       . Biotin 1000 MCG tablet Take 1,000 mcg by mouth daily.      . Calcium Citrate (CITRACAL PO) Take 2 tablets by mouth every morning.      . carvedilol (COREG) 12.5 MG tablet Take 0.5 tablets (6.25 mg total) by mouth 2 (two) times daily with a meal.  60 tablet  0  . cholecalciferol (VITAMIN D) 1000 UNITS tablet Take 1,000 Units by mouth every morning.       . clopidogrel (PLAVIX) 75 MG tablet Take 1 tablet by mouth every morning.       . ferrous sulfate 325 (65 FE) MG tablet Take 325 mg by mouth 2 (two) times daily.       Marland Kitchen glyBURIDE (DIABETA) 5 MG tablet Take 10 mg by mouth 2 (two) times daily with a meal.       . Glycerin-Hypromellose-PEG 400 (VISINE TEARS OP) Place 1 drop into both eyes daily as needed. For dry eyes      . isosorbide mononitrate (IMDUR) 30 MG 24 hr tablet Take 30 mg by mouth every morning.       . lamoTRIgine (LAMICTAL) 100 MG tablet Take 100 mg by mouth 2 (two) times daily.       Marland Kitchen LANTUS SOLOSTAR 100 UNIT/ML SOPN Inject 18 Units into the skin every morning.       . meclizine (ANTIVERT) 12.5 MG tablet Take 12.5 mg by mouth 3 (three) times daily as needed for dizziness. For inner ear/dizziness      . nitroGLYCERIN (NITROSTAT) 0.4 MG SL tablet Place 0.4 mg under the tongue every 5 (five) minutes x 3 doses as needed for chest pain (up to 3 doses).      Marland Kitchen omeprazole (PRILOSEC) 20 MG capsule Take 20 mg by mouth 2 (two) times daily.       Marland Kitchen SANTYL ointment Apply 1 application topically daily.       . diphenhydramine-acetaminophen (TYLENOL PM) 25-500 MG TABS Take 1 tablet by mouth at bedtime.       . QC PEN NEEDLES 31G X 6 MM MISC 3 (three) times daily.       No current facility-administered medications on file prior to visit.     REVIEW OF SYSTEMS: Please see history of  present illness, otherwise no change from prior visit  PHYSICAL EXAMINATION:   Vital signs are BP 184/80  Pulse 81  Ht 5\' 2"  (1.575 m)  Wt 164 lb  1.6 oz (74.435 kg)  BMI 30.01 kg/m2  SpO2 99% General: The patient appears their stated age. HEENT:  No gross abnormalities Pulmonary:  Non labored breathing Musculoskeletal: There are no major deformities. Neurologic: No focal weakness or paresthesias are detected, Skin: The ulcer remains stable size. Maybe slightly more superficial. The erythema has resolved. Psychiatric: The patient has normal affect. Cardiovascular: Pedal pulses are nonpalpable   Diagnostic Studies Ultrasound was ordered and reviewed today. Her ABI today is 0.79 on the right. This has increased from 0.62. On the left it remained stable at 0.86. Waveforms are monophasic on the right and triphasic on the left.  Assessment: Right foot ulcer. Plan: I feel the patient's foot looks much better than it did last time I saw her. I have recommended continuation of local wound care. She has completed a 5 week course of antibiotics. I have scheduled her for repeat attempt at recannulization of her right tibioperoneal trunk. This is been scheduled for Tuesday, August 26  V. Charlena Cross, M.D. [facility] [place] Office: [phone] Pager:  [phone]

## 2013-08-21 ENCOUNTER — Other Ambulatory Visit: Payer: Self-pay | Admitting: *Deleted

## 2013-08-21 DIAGNOSIS — I739 Peripheral vascular disease, unspecified: Secondary | ICD-10-CM

## 2013-08-21 DIAGNOSIS — Z48812 Encounter for surgical aftercare following surgery on the circulatory system: Secondary | ICD-10-CM

## 2013-09-20 ENCOUNTER — Ambulatory Visit (INDEPENDENT_AMBULATORY_CARE_PROVIDER_SITE_OTHER): Payer: Medicare Other | Admitting: Ophthalmology

## 2013-09-27 ENCOUNTER — Encounter: Payer: Self-pay | Admitting: Surgery

## 2013-09-30 ENCOUNTER — Ambulatory Visit (INDEPENDENT_AMBULATORY_CARE_PROVIDER_SITE_OTHER)
Admission: RE | Admit: 2013-09-30 | Discharge: 2013-09-30 | Disposition: A | Discharge status: Home / Self Care | Payer: Medicare Other | Source: Ambulatory Visit | Attending: Surgery | Admitting: Surgery

## 2013-09-30 ENCOUNTER — Ambulatory Visit (HOSPITAL_COMMUNITY)
Admission: RE | Admit: 2013-09-30 | Discharge: 2013-09-30 | Disposition: A | Discharge status: Home / Self Care | Payer: Medicare Other | Source: Ambulatory Visit | Attending: Surgery | Admitting: Surgery

## 2013-09-30 ENCOUNTER — Ambulatory Visit (INDEPENDENT_AMBULATORY_CARE_PROVIDER_SITE_OTHER): Payer: Medicare Other | Admitting: Surgery

## 2013-09-30 ENCOUNTER — Encounter: Payer: Self-pay | Admitting: Surgery

## 2013-09-30 ENCOUNTER — Ambulatory Visit: Payer: Medicare Other | Admitting: Infectious Diseases

## 2013-09-30 ENCOUNTER — Other Ambulatory Visit: Payer: Self-pay | Admitting: Surgery

## 2013-09-30 VITALS — BP 165/71 | HR 70 | Temp 98.1°F | Ht 62.0 in | Wt 154.0 lb

## 2013-09-30 DIAGNOSIS — I739 Peripheral vascular disease, unspecified: Secondary | ICD-10-CM

## 2013-09-30 DIAGNOSIS — I7025 Atherosclerosis of native arteries of other extremities with ulceration: Secondary | ICD-10-CM | POA: Insufficient documentation

## 2013-09-30 DIAGNOSIS — Z48812 Encounter for surgical aftercare following surgery on the circulatory system: Secondary | ICD-10-CM

## 2013-09-30 DIAGNOSIS — L98499 Non-pressure chronic ulcer of skin of other sites with unspecified severity: Secondary | ICD-10-CM

## 2013-09-30 NOTE — Progress Notes (Signed)
[facility] [place]   Patient name: [name] MRN: [id number] DOB: [birth year] Sex: female     Chief Complaint  Patient presents with  . Re-evaluation    1 month f/u - s/p angioplasty 08/20/2013    HISTORY OF PRESENT ILLNESS: The patient is back today for followup. On 08/20/2013 she underwent successful recanalization of an occluded anterior tibial artery with subsequent balloon angioplasty using a 3 mm balloon. This was done for a nonhealing ulcer. She is back today for followup. She has noted improvement in the ulcer. She is getting her wound care at the wound center. She is doing hyperbaric treatments.  Past Medical History  Diagnosis Date  . Essential hypertension, benign   . Gout   . Ischemic cardiomyopathy   . Systolic and diastolic CHF, chronic     Echo 04/05/12 EF 40-45%, anterolateral/posterior hypokinesis, grade 1 diastolic dysfunction, mild MR, mildly dilated LA  . Hyperlipidemia   . Coronary atherosclerosis of native coronary artery     s/p 4V CABG '97; NSTEMI -->Cardiac cath 04/03/12 revealed a new occlusion of SVG to OM system, Medical therapy (asa, plavix, statin, ranexa).  . Diabetes mellitus, type 2   . Seizures   . Arthritis   . Osteoporosis   . Retinal hemorrhage, both eyes   . Stroke     1980 and then 2 strokes 2012  . GERD (gastroesophageal reflux disease)     Past Surgical History  Procedure Laterality Date  . Coronary artery bypass graft      1997  . Knee surgery      bilateral  . Abdominal hysterectomy    . Cardiac catheterization      11/2010-patent grafts  . Right foot surgery      big toe removed and other surgeries on rt foot because of infection  . Left hip hemi-arthroplasty  07-31-2011    SURGERY AT [facility] FOR FRACTURE LEFT FEMORAL NECK  . Total hip revision  01/23/2012    Procedure: TOTAL HIP REVISION;  Surgeon: Shelda Pal, MD;  Location: [facility];  Service: Orthopedics;  Laterality: Left;  Conversion of  Previous  Surgery to a Left Total Hip  . Abdominal hysterectomy    . Joint replacement  Jan. 2013    Left Hip    History   Social History  . Marital Status: Widowed    Spouse Name: N/A    Number of Children: N/A  . Years of Education: N/A   Occupational History  . Not on file.   Social History Main Topics  . Smoking status: Never Smoker   . Smokeless tobacco: Never Used  . Alcohol Use: No  . Drug Use: No  . Sexual Activity: Not Currently    Birth Control/ Protection: Post-menopausal   Other Topics Concern  . Not on file   Social History Narrative  . No narrative on file    Family History  Problem Relation Age of Onset  . Heart attack Father 26  . Heart disease Father     Allergies as of 09/30/2013 - Review Complete 09/30/2013  Allergen Reaction Noted  . Ace inhibitors Other (See Comments) 10/20/2011  . Cephalexin Other (See Comments) 01/18/2012  . Furosemide Other (See Comments) 04/13/2011  . Levofloxacin Swelling 10/20/2011  . Vancomycin Hives and Itching 07/11/2013  . Bactrim Rash 04/13/2011  . Ciprocin-fluocin-procin [fluocinolone acetonide] Rash 04/13/2011  . Ciprofloxacin Rash 01/11/2012  . Codeine Rash 04/13/2011  . Morphine and related Rash 04/13/2011  .  Pentazocine Rash 04/13/2011  . Promethazine Swelling and Rash 01/11/2012  . Sulfa antibiotics Swelling and Rash 01/11/2012  . Trimethoprim Swelling and Rash 01/11/2012    Current Outpatient Prescriptions on File Prior to Visit  Medication Sig Dispense Refill  . allopurinol (ZYLOPRIM) 300 MG tablet Take 300 mg by mouth every morning.       Marland Kitchen aspirin EC 81 MG tablet Take 81 mg by mouth at bedtime.       Marland Kitchen atorvastatin (LIPITOR) 80 MG tablet Take 80 mg by mouth at bedtime.       . Biotin 1000 MCG tablet Take 1,000 mcg by mouth daily.      . Calcium Citrate (CITRACAL PO) Take 2 tablets by mouth every morning.      . carvedilol (COREG) 12.5 MG tablet Take 0.5 tablets (6.25 mg total) by mouth 2 (two) times daily  with a meal.  60 tablet  0  . cholecalciferol (VITAMIN D) 1000 UNITS tablet Take 1,000 Units by mouth every morning.       . clopidogrel (PLAVIX) 75 MG tablet Take 1 tablet by mouth every morning.       . diphenhydramine-acetaminophen (TYLENOL PM) 25-500 MG TABS Take 1 tablet by mouth at bedtime.       . ferrous sulfate 325 (65 FE) MG tablet Take 325 mg by mouth 2 (two) times daily.       Marland Kitchen glyBURIDE (DIABETA) 5 MG tablet Take 10 mg by mouth 2 (two) times daily with a meal.       . Glycerin-Hypromellose-PEG 400 (VISINE TEARS OP) Place 1 drop into both eyes daily as needed. For dry eyes      . isosorbide mononitrate (IMDUR) 30 MG 24 hr tablet Take 30 mg by mouth every morning.       . lamoTRIgine (LAMICTAL) 100 MG tablet Take 100 mg by mouth 2 (two) times daily.       Marland Kitchen LANTUS SOLOSTAR 100 UNIT/ML SOPN Inject 20 Units into the skin every morning.       . meclizine (ANTIVERT) 12.5 MG tablet Take 12.5 mg by mouth 3 (three) times daily as needed for dizziness. For inner ear/dizziness      . nitroGLYCERIN (NITROSTAT) 0.4 MG SL tablet Place 0.4 mg under the tongue every 5 (five) minutes as needed for chest pain.      Marland Kitchen omeprazole (PRILOSEC) 20 MG capsule Take 20 mg by mouth 2 (two) times daily.       . QC PEN NEEDLES 31G X 6 MM MISC 3 (three) times daily.      Marland Kitchen torsemide (DEMADEX) 20 MG tablet Take 20 mg by mouth every morning.        No current facility-administered medications on file prior to visit.     REVIEW OF SYSTEMS: Please see history of present illness, otherwise no changes from prior visit  PHYSICAL EXAMINATION:   Vital signs are BP 165/71  Pulse 70  Temp(Src) 98.1 F (36.7 C) (Oral)  Ht 5\' 2"  (1.575 m)  Wt 154 lb (69.854 kg)  BMI 28.16 kg/m2  SpO2 100% General: The patient appears their stated age. HEENT:  No gross abnormalities Pulmonary:  Non labored breathing Musculoskeletal: There are no major deformities. Neurologic: No focal weakness or paresthesias are  detected, Skin: 1 cm ulcer at the fifth metatarsal base on the right shows granulation tissue and appears to be more superficial. No evidence of infection Psychiatric: The patient has normal affect. Cardiovascular: There is a regular  rate and rhythm without significant murmur appreciated. Palpable dorsalis pedis pulse   Diagnostic Studies Duplex ultrasound was ordered and reviewed today. This shows a widely patent anterior tibial artery. Ankle-brachial index is 0.96. The left ABI is 1.0  Assessment: Right leg ulcer, status post percutaneous revascularization Plan: The patient's wound continues to improve. I would like to keep a close watch on her blood flow while we are being as aggressive as possible to get this to heal. I will bring her back in 3 months for a duplex ultrasound.  Jorge Ny, M.D. [facility] [place] Office: [phone] Pager:  [phone]

## 2013-10-25 ENCOUNTER — Ambulatory Visit: Payer: Medicare Other | Admitting: Neurology

## 2013-10-25 ENCOUNTER — Ambulatory Visit (INDEPENDENT_AMBULATORY_CARE_PROVIDER_SITE_OTHER): Payer: Medicare Other | Admitting: Neurology

## 2013-10-25 ENCOUNTER — Encounter: Payer: Self-pay | Admitting: Neurology

## 2013-10-25 VITALS — BP 120/64 | HR 80 | Wt 158.5 lb

## 2013-10-25 DIAGNOSIS — G40309 Generalized idiopathic epilepsy and epileptic syndromes, not intractable, without status epilepticus: Secondary | ICD-10-CM

## 2013-10-25 HISTORY — DX: Generalized idiopathic epilepsy and epileptic syndromes, not intractable, without status epilepticus: G40.309

## 2013-10-25 NOTE — Patient Instructions (Signed)
Seizure, Adult A seizure is abnormal electrical activity in the brain. Seizures can cause a change in attention or behavior (altered mental status). Seizures often involve uncontrollable shaking (convulsions). Seizures usually last from 30 seconds to 2 minutes. Epilepsy is a brain disorder in which a patient has repeated seizures over time. CAUSES  There are many different problems that can cause seizures. In some cases, no cause is found. Common causes of seizures include:  Head injuries.  Brain tumors.  Infections.  Imbalance of chemicals in the blood.  Kidney failure or liver failure.  Heart disease.  Drug abuse.  Stroke.  Withdrawal from certain drugs or alcohol.  Birth defects.  Malfunction of a neurosurgical device placed in the brain. SYMPTOMS  Symptoms vary depending on the part of the brain that is involved. Right before a seizure, you may have a warning (aura) that a seizure is about to occur. An aura may include the following symptoms:   Fear or anxiety.  Nausea.  Feeling like the room is spinning (vertigo).  Vision changes, such as seeing flashing lights or spots. Common symptoms during a seizure include:  Convulsions.  Drooling.  Rapid eye movements.  Grunting.  Loss of bladder and bowel control.  Bitter taste in the mouth. After a seizure, you may feel confused and sleepy. You may also have an injury resulting from convulsions during the seizure. DIAGNOSIS  Your caregiver will perform a physical exam and run some tests to determine the type and cause of your seizure. These tests may include:  Blood tests.  A lumbar puncture test. In this test, a small amount of fluid is removed from the spine and examined.  Electrocardiography (ECG). This test records the electrical activity in your heart.  Imaging tests, such as computed tomography (CT) scans or magnetic resonance imaging (MRI).  Electroencephalography (EEG). This test records the electrical  activity in your brain. TREATMENT  Seizures usually stop on their own. Treatment will depend on the cause of your seizure. In some cases, medicine may be given to prevent future seizures. HOME CARE INSTRUCTIONS   If you are given medicines, take them exactly as prescribed by your caregiver.  Keep all follow-up appointments as directed by your caregiver.  Do not swim or drive until your caregiver says it is okay.  Teach friends and family what to do if you have a seizure. They should:  Lay you on the ground to prevent a fall.  Put a cushion under your head.  Loosen any tight clothing around your neck.  Turn you on your side. If vomiting occurs, this helps keep your airway clear.  Stay with you until you recover. SEEK IMMEDIATE MEDICAL CARE IF:  The seizure lasts longer than 2 to 5 minutes.  The seizure is severe or the person does not wake up after the seizure.  The person has altered mental status. Drive the person to the emergency department or call your local emergency services (911 in [place] MAKE SURE YOU:  Understand these instructions.  Will watch your condition.  Will get help right away if you are not doing well or get worse. Document Released: 12/09/2000 Document Revised: 03/05/2012 Document Reviewed: 11/30/2011 [employer] Patient Information 2014 [employer].  

## 2013-10-25 NOTE — Progress Notes (Signed)
Reason for visit: Seizures  [name] is an 79 y.o. female  History of present illness:  [name] is an 79 year old right-handed white female with a history of diabetes, diabetic peripheral neuropathy, peripheral vascular disease, and cerebrovascular disease. The patient sustained a left brain stroke previously, and she has seizures secondary to this. The patient initially had a Broca's aphasia, and a right hemiparesis associated with a stroke. The patient has a left brain focus for the seizure. The patient has been on Dilantin in the past, but she had osteoporosis and gum hypertrophy, and the medication was switched to Lamictal. The patient has done quite well on 100 mg twice daily. The patient last suffered a seizure in 2012, and she fell and broke her left hip with a seizure. The patient has been doing well without any recurrence of seizures since that time. The patient lives alone at home, but within the next year, she may be moving down to the [place], [place] area to live with her son. The patient is actively being treated this time for diabetic ulcer on her right foot associated with severe peripheral vascular disease. The patient uses a walker for ambulation, and she denies any recent falls. The patient is tolerating the Lamictal well.  Past Medical History  Diagnosis Date  . Essential hypertension, benign   . Gout   . Ischemic cardiomyopathy   . Systolic and diastolic CHF, chronic     Echo 04/05/12 EF 40-45%, anterolateral/posterior hypokinesis, grade 1 diastolic dysfunction, mild MR, mildly dilated LA  . Hyperlipidemia   . Coronary atherosclerosis of native coronary artery     s/p 4V CABG '97; NSTEMI -->Cardiac cath 04/03/12 revealed a new occlusion of SVG to OM system, Medical therapy (asa, plavix, statin, ranexa).  . Diabetes mellitus, type 2   . Seizures   . Arthritis   . Osteoporosis   . Retinal hemorrhage, both eyes   . Stroke     1980 and then 2 strokes  2012  . GERD (gastroesophageal reflux disease)   . Generalized convulsive epilepsy without mention of intractable epilepsy 10/25/2013  . Peripheral vascular disease   . Benign positional vertigo   . Gait disorder   . Closed right ankle fracture   . Diabetic peripheral neuropathy     Past Surgical History  Procedure Laterality Date  . Coronary artery bypass graft      1997  . Knee surgery      bilateral  . Abdominal hysterectomy    . Cardiac catheterization      11/2010-patent grafts  . Right foot surgery      big toe removed and other surgeries on rt foot because of infection  . Left hip hemi-arthroplasty  07-31-2011    SURGERY AT [facility] FOR FRACTURE LEFT FEMORAL NECK  . Total hip revision  01/23/2012    Procedure: TOTAL HIP REVISION;  Surgeon: Shelda Pal, MD;  Location: [facility] ORS;  Service: Orthopedics;  Laterality: Left;  Conversion of  Previous Surgery to a Left Total Hip  . Abdominal hysterectomy    . Joint replacement  Jan. 2013    Left Hip  . Cardiac catheterization      7/14,8/14    Family History  Problem Relation Age of Onset  . Heart attack Father 58  . Heart disease Father     Social history:  reports that she has never smoked. She has never used smokeless tobacco. She reports that she does not drink alcohol or  use illicit drugs.    Allergies  Allergen Reactions  . Ace Inhibitors Other (See Comments)    Hyperkalemia  . Cephalexin Other (See Comments)    REACTION: unknown-but on pt's list of medications that she was told she was very allergic to.  . Furosemide Other (See Comments)    presyncope  . Levofloxacin Swelling  . Vancomycin Hives and Itching  . Bactrim Rash  . Ciprocin-Fluocin-Procin [Fluocinolone Acetonide] Rash  . Ciprofloxacin Rash  . Codeine Rash  . Morphine And Related Rash  . Pentazocine Rash  . Promethazine Swelling and Rash  . Sulfa Antibiotics Swelling and Rash  . Trimethoprim Swelling and Rash    Medications:  Current Outpatient  Prescriptions on File Prior to Visit  Medication Sig Dispense Refill  . allopurinol (ZYLOPRIM) 300 MG tablet Take 300 mg by mouth every morning.       Marland Kitchen aspirin EC 81 MG tablet Take 81 mg by mouth at bedtime.       Marland Kitchen atorvastatin (LIPITOR) 80 MG tablet Take 80 mg by mouth at bedtime.       . Biotin 1000 MCG tablet Take 1,000 mcg by mouth daily.      . Calcium Citrate (CITRACAL PO) Take 2 tablets by mouth every morning.      . carvedilol (COREG) 12.5 MG tablet Take 0.5 tablets (6.25 mg total) by mouth 2 (two) times daily with a meal.  60 tablet  0  . cholecalciferol (VITAMIN D) 1000 UNITS tablet Take 1,000 Units by mouth every morning.       . clopidogrel (PLAVIX) 75 MG tablet Take 1 tablet by mouth every morning.       . diphenhydramine-acetaminophen (TYLENOL PM) 25-500 MG TABS Take 1 tablet by mouth at bedtime.       . ferrous sulfate 325 (65 FE) MG tablet Take 325 mg by mouth 2 (two) times daily.       Marland Kitchen glyBURIDE (DIABETA) 5 MG tablet Take 5 mg by mouth 2 (two) times daily. 1 pill am; 1 pill pm      . Glycerin-Hypromellose-PEG 400 (VISINE TEARS OP) Place 1 drop into both eyes daily as needed. For dry eyes      . isosorbide mononitrate (IMDUR) 30 MG 24 hr tablet Take 30 mg by mouth every morning.       . lamoTRIgine (LAMICTAL) 100 MG tablet Take 100 mg by mouth 2 (two) times daily.       Marland Kitchen LANTUS SOLOSTAR 100 UNIT/ML SOPN Inject 20 Units into the skin every morning.       . meclizine (ANTIVERT) 12.5 MG tablet Take 12.5 mg by mouth 3 (three) times daily as needed for dizziness. For inner ear/dizziness      . nitroGLYCERIN (NITROSTAT) 0.4 MG SL tablet Place 0.4 mg under the tongue every 5 (five) minutes as needed for chest pain.      Marland Kitchen omeprazole (PRILOSEC) 20 MG capsule Take 20 mg by mouth 2 (two) times daily.       . QC PEN NEEDLES 31G X 6 MM MISC 3 (three) times daily.      Marland Kitchen torsemide (DEMADEX) 20 MG tablet Take 20 mg by mouth every morning.        No current facility-administered  medications on file prior to visit.    ROS:  Out of a complete 14 system review of symptoms, the patient complains only of the following symptoms, and all other reviewed systems are negative.  Gait disorder Numbness  in the feet History seizures  Blood pressure 120/64, pulse 80, weight 158 lb 8 oz (71.895 kg).  Physical Exam  General: The patient is alert and cooperative at the time of the examination.  Skin: No significant peripheral edema is noted. A healing shoe and bandaging is noted on the right foot.   Neurologic Exam  Mental status: The patient is oriented x 3.  Cranial nerves: Facial symmetry is present. Speech is normal, no aphasia or dysarthria is noted. Extraocular movements are full. Visual fields are full.  Motor: The patient has good strength in all 4 extremities.  Sensory examination: Soft touch sensation is symmetric on the face, arms, and legs. There is a stocking pinprick sensory deficit up to the knees bilaterally.  Coordination: The patient has good finger-nose-finger and heel-to-shin bilaterally.  Gait and station: The patient has a wide-based, unsteady gait. The patient walks with a walker. Tandem gait could not be attempted. Romberg is unsteady. No drift is seen.  Reflexes: Deep tendon reflexes are symmetric, but are depressed.   Assessment/Plan:  1. Cerebrovascular disease, left brain stroke  2. Seizures secondary to #1  3. Diabetes  4. Diabetic peripheral neuropathy  5. Gait disorder  6. Peripheral vascular disease  The patient will be continued on the Lamictal for now. The patient has had a recent chemistry panel and a CBC done. The patient does have some mild chronic renal insufficiency. The patient will followup through this office in one year.  Marlan Palau MD 10/26/2013 7:58 AM  [facility] [address] [place], [place] [address]  Phone [phone] Fax [phone]

## 2013-10-28 ENCOUNTER — Ambulatory Visit (INDEPENDENT_AMBULATORY_CARE_PROVIDER_SITE_OTHER): Payer: No Typology Code available for payment source | Admitting: Ophthalmology

## 2013-10-29 ENCOUNTER — Encounter (INDEPENDENT_AMBULATORY_CARE_PROVIDER_SITE_OTHER): Payer: Self-pay | Admitting: *Deleted

## 2013-11-13 ENCOUNTER — Other Ambulatory Visit: Payer: Self-pay | Admitting: Surgery

## 2013-11-13 ENCOUNTER — Encounter (HOSPITAL_COMMUNITY): Payer: Medicare Other

## 2013-11-13 DIAGNOSIS — Z48812 Encounter for surgical aftercare following surgery on the circulatory system: Secondary | ICD-10-CM

## 2013-11-13 DIAGNOSIS — I739 Peripheral vascular disease, unspecified: Secondary | ICD-10-CM

## 2013-11-14 ENCOUNTER — Telehealth: Payer: Self-pay | Admitting: *Deleted

## 2013-11-14 NOTE — Telephone Encounter (Signed)
Patient informed. 

## 2013-11-14 NOTE — Telephone Encounter (Signed)
Patient called requesting cardiologist to refer her to a good wound doctor with [facility]. Patient informed nurse that she has being seeing a Dr. Bertram Gala with [facility]. Patient wants a second opinion and wants advice from cardiologist. Patient said she has being going to the [facility] since August with no improvement. Patient also said that Dr. Estanislado Spire office referred her to [facility] wound center. Please advise.

## 2013-11-14 NOTE — Telephone Encounter (Signed)
It would be best if Dr. Estanislado Spire office made this recommendation.

## 2013-11-15 ENCOUNTER — Ambulatory Visit (INDEPENDENT_AMBULATORY_CARE_PROVIDER_SITE_OTHER): Payer: Medicare Other | Admitting: Ophthalmology

## 2013-11-15 ENCOUNTER — Ambulatory Visit (INDEPENDENT_AMBULATORY_CARE_PROVIDER_SITE_OTHER): Payer: No Typology Code available for payment source | Admitting: Ophthalmology

## 2013-11-15 DIAGNOSIS — E11359 Type 2 diabetes mellitus with proliferative diabetic retinopathy without macular edema: Secondary | ICD-10-CM

## 2013-11-15 DIAGNOSIS — H35039 Hypertensive retinopathy, unspecified eye: Secondary | ICD-10-CM

## 2013-11-15 DIAGNOSIS — H2700 Aphakia, unspecified eye: Secondary | ICD-10-CM

## 2013-11-15 DIAGNOSIS — I10 Essential (primary) hypertension: Secondary | ICD-10-CM

## 2013-11-15 DIAGNOSIS — H43819 Vitreous degeneration, unspecified eye: Secondary | ICD-10-CM

## 2013-11-15 DIAGNOSIS — E11319 Type 2 diabetes mellitus with unspecified diabetic retinopathy without macular edema: Secondary | ICD-10-CM

## 2013-11-15 DIAGNOSIS — E1139 Type 2 diabetes mellitus with other diabetic ophthalmic complication: Secondary | ICD-10-CM

## 2013-12-09 ENCOUNTER — Telehealth: Payer: Self-pay

## 2013-12-09 NOTE — Telephone Encounter (Signed)
I spoke with [name] to schedule appointment on 12/23/13 at 9:00am. She is aware and will be here at 845 for registration, dpm

## 2013-12-09 NOTE — Telephone Encounter (Signed)
Phone call from pt.  Stated she has been released from the [facility] at this time, as she requested to seek a 2nd opinion.  Stated Dr. Audie Clear recommended an amputation of the small toe on right foot; was told there is an infection in the toe and it needs amputated.   Pt. Stated "my daughter looked at it last night and there is a dry scab over the ulcer on the side of my (R) foot; denied redness or warmth.  Pt. stated she wished to get a 2nd opinion with Dr. Lucius Conn [facility] to get copy of recent office notes/ xray.  Discussed with Dr. Myra Gianotti.  Advised to move appt. From 01/06/14 to 12/23/13.

## 2013-12-13 ENCOUNTER — Encounter: Payer: Self-pay | Admitting: Cardiology

## 2013-12-13 ENCOUNTER — Ambulatory Visit (INDEPENDENT_AMBULATORY_CARE_PROVIDER_SITE_OTHER): Payer: Medicare Other | Admitting: Cardiology

## 2013-12-13 VITALS — BP 115/67 | HR 77 | Ht 62.0 in | Wt 159.0 lb

## 2013-12-13 DIAGNOSIS — I10 Essential (primary) hypertension: Secondary | ICD-10-CM

## 2013-12-13 DIAGNOSIS — I2510 Atherosclerotic heart disease of native coronary artery without angina pectoris: Secondary | ICD-10-CM

## 2013-12-13 DIAGNOSIS — I743 Embolism and thrombosis of arteries of the lower extremities: Secondary | ICD-10-CM

## 2013-12-13 DIAGNOSIS — E785 Hyperlipidemia, unspecified: Secondary | ICD-10-CM

## 2013-12-13 DIAGNOSIS — I779 Disorder of arteries and arterioles, unspecified: Secondary | ICD-10-CM

## 2013-12-13 NOTE — Assessment & Plan Note (Signed)
Symptomatically stable with multivessel disease status post CABG, documentation of occluded SVG to obtuse marginal system in April of last year. Plan to continue medical therapy including DAPT. She states that she wants to maintain annual followup at this time. We will schedule this visit, however if she develops any worsening symptoms in the meanwhile, she was encouraged to come back sooner.

## 2013-12-13 NOTE — Assessment & Plan Note (Signed)
As noted above, followed by Dr. Myra Gianotti. She is being considered for possible toe amputation on the right.

## 2013-12-13 NOTE — Progress Notes (Signed)
Clinical Summary [name] is an 79 y.o.female last seen in August. She has been stable from a cardiac perspective, no angina, baseline NYHA class 2-3 dyspnea. She has had no falls, uses a walker with balance problems and leg weakness/pain.  She has had evaluation by Dr. Myra Gianotti, underwent recanalization of an occluded anterior tibial artery with angioplasty back in August for treatment of a nonhealing ulcer on that side. She has an ABI of 0.96 on the right and 1.0 on the left. Currently still having trouble with poor wound healing and may need a toe amputation. She has followup with Dr. Myra Gianotti later this month.  She continues to followup with Dr. Sherryll Burger.  Cardiac catheterization from April of last year noted below with vein graft disease. She has been relatively stable on medical therapy.   Allergies  Allergen Reactions  . Ace Inhibitors Other (See Comments)    Hyperkalemia  . Cephalexin Other (See Comments)    REACTION: unknown-but on pt's list of medications that she was told she was very allergic to.  . Furosemide Other (See Comments)    presyncope  . Levofloxacin Swelling  . Vancomycin Hives and Itching  . Bactrim Rash  . Ciprocin-Fluocin-Procin [Fluocinolone Acetonide] Rash  . Ciprofloxacin Rash  . Codeine Rash  . Morphine And Related Rash  . Pentazocine Rash  . Promethazine Swelling and Rash  . Sulfa Antibiotics Swelling and Rash  . Trimethoprim Swelling and Rash    Current Outpatient Prescriptions  Medication Sig Dispense Refill  . allopurinol (ZYLOPRIM) 300 MG tablet Take 300 mg by mouth every morning.       Marland Kitchen aspirin EC 81 MG tablet Take 81 mg by mouth at bedtime.       Marland Kitchen atorvastatin (LIPITOR) 80 MG tablet Take 80 mg by mouth at bedtime.       . Biotin 1000 MCG tablet Take 1,000 mcg by mouth daily.      . Calcium Citrate (CITRACAL PO) Take 2 tablets by mouth every morning.      . carvedilol (COREG) 12.5 MG tablet Take 0.5 tablets (6.25 mg total) by mouth 2  (two) times daily with a meal.  60 tablet  0  . cholecalciferol (VITAMIN D) 1000 UNITS tablet Take 1,000 Units by mouth every morning.       . clopidogrel (PLAVIX) 75 MG tablet Take 1 tablet by mouth every morning.       . diphenhydramine-acetaminophen (TYLENOL PM) 25-500 MG TABS Take 1 tablet by mouth at bedtime.       . ferrous sulfate 325 (65 FE) MG tablet Take 325 mg by mouth 2 (two) times daily.       Marland Kitchen glyBURIDE (DIABETA) 5 MG tablet Take 5 mg by mouth daily.       . Glycerin-Hypromellose-PEG 400 (VISINE TEARS OP) Place 1 drop into both eyes daily as needed. For dry eyes      . insulin lispro (HUMALOG) 100 UNIT/ML injection Inject 5 Units into the skin 3 (three) times daily. With dinner; sliding scale      . isosorbide mononitrate (IMDUR) 30 MG 24 hr tablet Take 30 mg by mouth every morning.       . lamoTRIgine (LAMICTAL) 100 MG tablet Take 100 mg by mouth 2 (two) times daily.       Marland Kitchen LANTUS SOLOSTAR 100 UNIT/ML SOPN Inject 25 Units into the skin every morning.       . meclizine (ANTIVERT) 12.5 MG tablet Take 12.5 mg  by mouth 3 (three) times daily as needed for dizziness. For inner ear/dizziness      . nitroGLYCERIN (NITROSTAT) 0.4 MG SL tablet Place 0.4 mg under the tongue every 5 (five) minutes as needed for chest pain.      Marland Kitchen omeprazole (PRILOSEC) 20 MG capsule Take 20 mg by mouth 2 (two) times daily.       . QC PEN NEEDLES 31G X 6 MM MISC 3 (three) times daily.      Marland Kitchen torsemide (DEMADEX) 20 MG tablet Take 20 mg by mouth every morning.        No current facility-administered medications for this visit.    Past Medical History  Diagnosis Date  . Essential hypertension, benign   . Gout   . Ischemic cardiomyopathy   . Systolic and diastolic CHF, chronic     Echo 04/05/12 EF 40-45%, anterolateral/posterior hypokinesis, grade 1 diastolic dysfunction, mild MR, mildly dilated LA  . Hyperlipidemia   . Coronary atherosclerosis of native coronary artery     s/p 4V CABG '97; NSTEMI  -->Cardiac cath 04/03/12 revealed a new occlusion of SVG to OM system, Medical therapy (asa, plavix, statin, ranexa).  . Diabetes mellitus, type 2   . Seizures   . Arthritis   . Osteoporosis   . Retinal hemorrhage, both eyes   . Stroke     1980 and then 2 strokes 2012  . GERD (gastroesophageal reflux disease)   . Generalized convulsive epilepsy without mention of intractable epilepsy 10/25/2013  . Peripheral vascular disease   . Benign positional vertigo   . Gait disorder   . Closed right ankle fracture   . Diabetic peripheral neuropathy     Social History [name] reports that she has never smoked. She has never used smokeless tobacco. [name] reports that she does Tiffany drink alcohol.  Review of Systems Outlined above. No palpitations or syncope. No reported bleeding problems. Stable appetite.  Physical Examination Filed Vitals:   12/13/13 1019  BP: 115/67  Pulse: 77   Filed Weights   12/13/13 1019  Weight: 159 lb (72.122 kg)    Elderly woman, appears comfortable at rest.  HEENT: Conjunctiva and lids normal, oropharynx clear.  Neck: Supple, no elevated JVP or carotid bruits, no thyromegaly.  Lungs: Clear to auscultation, diminished, nonlabored breathing at rest.  Cardiac: Regular rate and rhythm, no S3, 2/6 systolic murmur, no pericardial rub.  Abdomen: Soft, nontender, bowel sounds present.  Extremities: 1+ edema, right foot with open diabetic support shoe. Skin: Warm and dry. Musculoskeletal: Mild kyphosis. Neuropsychiatric: Alert and oriented x3, affect appropriate.   Problem List and Plan   Coronary atherosclerosis of native coronary artery Symptomatically stable with multivessel disease status post CABG, documentation of occluded SVG to obtuse marginal system in April of last year. Plan to continue medical therapy including DAPT. She states that she wants to maintain annual followup at this time. We will schedule this visit, however if she develops any  worsening symptoms in the meanwhile, she was encouraged to come back sooner.  Hyperlipidemia She continues on high-dose Lipitor. Followup FLP and LFT for next visit.  Peripheral arterial occlusive disease As noted above, followed by Dr. Myra Gianotti. She is being considered for possible toe amputation on the right.  Essential hypertension, benign Blood pressure is normal today.    Jonelle Sidle, M.D., F.A.C.C.

## 2013-12-13 NOTE — Assessment & Plan Note (Signed)
She continues on high-dose Lipitor. Followup FLP and LFT for next visit.

## 2013-12-13 NOTE — Patient Instructions (Signed)
Continue all current medications. Your physician wants you to follow up in:  1 year.  You will receive a reminder letter in the mail one-two months in advance.  If you don't receive a letter, please call our office to schedule the follow up appointment   

## 2013-12-13 NOTE — Assessment & Plan Note (Signed)
Blood pressure is normal today. 

## 2013-12-18 ENCOUNTER — Encounter: Payer: Self-pay | Admitting: Surgery

## 2013-12-23 ENCOUNTER — Ambulatory Visit (INDEPENDENT_AMBULATORY_CARE_PROVIDER_SITE_OTHER): Payer: Medicare Other | Admitting: Surgery

## 2013-12-23 ENCOUNTER — Encounter: Payer: Self-pay | Admitting: Surgery

## 2013-12-23 ENCOUNTER — Ambulatory Visit (INDEPENDENT_AMBULATORY_CARE_PROVIDER_SITE_OTHER)
Admission: RE | Admit: 2013-12-23 | Discharge: 2013-12-23 | Disposition: A | Discharge status: Home / Self Care | Payer: Medicare Other | Source: Ambulatory Visit

## 2013-12-23 ENCOUNTER — Ambulatory Visit (HOSPITAL_COMMUNITY)
Admission: RE | Admit: 2013-12-23 | Discharge: 2013-12-23 | Disposition: A | Discharge status: Home / Self Care | Payer: Medicare Other | Source: Ambulatory Visit | Attending: Surgery | Admitting: Surgery

## 2013-12-23 ENCOUNTER — Encounter (INDEPENDENT_AMBULATORY_CARE_PROVIDER_SITE_OTHER): Payer: Self-pay

## 2013-12-23 VITALS — BP 151/67 | HR 79 | Temp 98.1°F | Ht 62.0 in | Wt 156.1 lb

## 2013-12-23 DIAGNOSIS — I739 Peripheral vascular disease, unspecified: Secondary | ICD-10-CM | POA: Insufficient documentation

## 2013-12-23 DIAGNOSIS — Z48812 Encounter for surgical aftercare following surgery on the circulatory system: Secondary | ICD-10-CM

## 2013-12-23 DIAGNOSIS — E119 Type 2 diabetes mellitus without complications: Secondary | ICD-10-CM | POA: Insufficient documentation

## 2013-12-23 DIAGNOSIS — S98119A Complete traumatic amputation of unspecified great toe, initial encounter: Secondary | ICD-10-CM | POA: Insufficient documentation

## 2013-12-23 DIAGNOSIS — L97509 Non-pressure chronic ulcer of other part of unspecified foot with unspecified severity: Secondary | ICD-10-CM | POA: Insufficient documentation

## 2013-12-23 DIAGNOSIS — L98499 Non-pressure chronic ulcer of skin of other sites with unspecified severity: Secondary | ICD-10-CM | POA: Insufficient documentation

## 2013-12-23 DIAGNOSIS — I10 Essential (primary) hypertension: Secondary | ICD-10-CM | POA: Insufficient documentation

## 2013-12-23 DIAGNOSIS — E785 Hyperlipidemia, unspecified: Secondary | ICD-10-CM | POA: Insufficient documentation

## 2013-12-23 NOTE — Progress Notes (Signed)
Patient name: [name] MRN: [id number] DOB: 12/12/1931 Sex: female     Chief Complaint  Patient presents with  . Re-evaluation    3 month f/u - 2nd opinion of possible amp of small toe R foot    HISTORY OF PRESENT ILLNESS: The patient is back today for followup. On 08/20/2013 she underwent successful recanalization of an occluded anterior tibial artery with subsequent balloon angioplasty using a 3 mm balloon. This was done for a nonhealing ulcer. She is back today for followup. She has noted improvement in the ulcer. She is getting her wound care at the wound center. She has completed hyperbaric treatments. She reports that the swelling and redness have gotten last.  She also reports the drainage has nearly subsided her biggest complaint is that of overall body weakness   Past Medical History  Diagnosis Date  . Essential hypertension, benign   . Gout   . Ischemic cardiomyopathy   . Systolic and diastolic CHF, chronic     Echo 04/05/12 EF 40-45%, anterolateral/posterior hypokinesis, grade 1 diastolic dysfunction, mild MR, mildly dilated LA  . Hyperlipidemia   . Coronary atherosclerosis of native coronary artery     s/p 4V CABG '97; NSTEMI -->Cardiac cath 04/03/12 revealed a new occlusion of SVG to OM system, Medical therapy (asa, plavix, statin, ranexa).  . Diabetes mellitus, type 2   . Seizures   . Arthritis   . Osteoporosis   . Retinal hemorrhage, both eyes   . Stroke     1980 and then 2 strokes 2012  . GERD (gastroesophageal reflux disease)   . Generalized convulsive epilepsy without mention of intractable epilepsy 10/25/2013  . Peripheral vascular disease   . Benign positional vertigo   . Gait disorder   . Closed right ankle fracture   . Diabetic peripheral neuropathy     Past Surgical History  Procedure Laterality Date  . Coronary artery bypass graft      1997  . Knee surgery      bilateral  . Abdominal hysterectomy    . Cardiac catheterization     11/2010-patent grafts  . Right foot surgery      big toe removed and other surgeries on rt foot because of infection  . Left hip hemi-arthroplasty  07-31-2011    SURGERY AT [facility] FOR FRACTURE LEFT FEMORAL NECK  . Total hip revision  01/23/2012    Procedure: TOTAL HIP REVISION;  Surgeon: Shelda Pal, MD;  Location: [facility];  Service: Orthopedics;  Laterality: Left;  Conversion of  Previous Surgery to a Left Total Hip  . Abdominal hysterectomy    . Joint replacement  Jan. 2013    Left Hip  . Cardiac catheterization      7/14,8/14    History   Social History  . Marital Status: Widowed    Spouse Name: N/A    Number of Children: 1  . Years of Education: 10 th   Occupational History  .     Social History Main Topics  . Smoking status: Never Smoker   . Smokeless tobacco: Never Used  . Alcohol Use: No  . Drug Use: No  . Sexual Activity: Not Currently    Birth Control/ Protection: Post-menopausal   Other Topics Concern  . Not on file   Social History Narrative  . No narrative on file    Family History  Problem Relation Age of Onset  . Heart attack Father 60  . Heart disease Father  Allergies as of 12/23/2013 - Review Complete 12/23/2013  Allergen Reaction Noted  . Ace inhibitors Other (See Comments) 10/20/2011  . Cephalexin Other (See Comments) 01/18/2012  . Furosemide Other (See Comments) 04/13/2011  . Levofloxacin Swelling 10/20/2011  . Vancomycin Hives and Itching 07/11/2013  . Bactrim Rash 04/13/2011  . Ciprocin-fluocin-procin [fluocinolone acetonide] Rash 04/13/2011  . Ciprofloxacin Rash 01/11/2012  . Codeine Rash 04/13/2011  . Morphine and related Rash 04/13/2011  . Pentazocine Rash 04/13/2011  . Promethazine Swelling and Rash 01/11/2012  . Sulfa antibiotics Swelling and Rash 01/11/2012  . Trimethoprim Swelling and Rash 01/11/2012    Current Outpatient Prescriptions on File Prior to Visit  Medication Sig Dispense Refill  . allopurinol (ZYLOPRIM) 300 MG  tablet Take 300 mg by mouth every morning.       Marland Kitchen aspirin EC 81 MG tablet Take 81 mg by mouth at bedtime.       Marland Kitchen atorvastatin (LIPITOR) 80 MG tablet Take 80 mg by mouth at bedtime.       . Biotin 1000 MCG tablet Take 1,000 mcg by mouth daily.      . Calcium Citrate (CITRACAL PO) Take 2 tablets by mouth every morning.      . carvedilol (COREG) 12.5 MG tablet Take 0.5 tablets (6.25 mg total) by mouth 2 (two) times daily with a meal.  60 tablet  0  . cholecalciferol (VITAMIN D) 1000 UNITS tablet Take 1,000 Units by mouth every morning.       . clopidogrel (PLAVIX) 75 MG tablet Take 1 tablet by mouth every morning.       . diphenhydramine-acetaminophen (TYLENOL PM) 25-500 MG TABS Take 1 tablet by mouth at bedtime.       . ferrous sulfate 325 (65 FE) MG tablet Take 325 mg by mouth 2 (two) times daily.       Marland Kitchen glyBURIDE (DIABETA) 5 MG tablet Take 5 mg by mouth daily.       . Glycerin-Hypromellose-PEG 400 (VISINE TEARS OP) Place 1 drop into both eyes daily as needed. For dry eyes      . insulin lispro (HUMALOG) 100 UNIT/ML injection Inject 5 Units into the skin 3 (three) times daily. With dinner; sliding scale      . isosorbide mononitrate (IMDUR) 30 MG 24 hr tablet Take 30 mg by mouth every morning.       . lamoTRIgine (LAMICTAL) 100 MG tablet Take 100 mg by mouth 2 (two) times daily.       Marland Kitchen LANTUS SOLOSTAR 100 UNIT/ML SOPN Inject 25 Units into the skin every morning.       . meclizine (ANTIVERT) 12.5 MG tablet Take 12.5 mg by mouth 3 (three) times daily as needed for dizziness. For inner ear/dizziness      . nitroGLYCERIN (NITROSTAT) 0.4 MG SL tablet Place 0.4 mg under the tongue every 5 (five) minutes as needed for chest pain.      Marland Kitchen omeprazole (PRILOSEC) 20 MG capsule Take 20 mg by mouth 2 (two) times daily.       . QC PEN NEEDLES 31G X 6 MM MISC 3 (three) times daily.      Marland Kitchen torsemide (DEMADEX) 20 MG tablet Take 20 mg by mouth every morning.        No current facility-administered medications  on file prior to visit.     REVIEW OF SYSTEMS: Please see history of present illness, otherwise no changes from prior PHYSICAL EXAMINATION:   Vital signs are BP 151/67  Pulse 79  Temp(Src) 98.1 F (36.7 C) (Oral)  Ht 5\' 2"  (1.575 m)  Wt 156 lb 1.6 oz (70.806 kg)  BMI 28.54 kg/m2  SpO2 98% General: The patient appears their stated age. HEENT:  No gross abnormalities Pulmonary:  Non labored breathing Abdomen: Soft and non-tender Musculoskeletal: There are no major deformities. Neurologic: No focal weakness or paresthesias are detected, Skin: The ulcer on the right fifth metatarsal head had slightly contracted.  I see no gross evidence of infection.  There is no erythema Psychiatric: The patient has normal affect. Cardiovascular: Pedal pulses not palpable   Diagnostic Studies I have ordered and reviewed her duplex ultrasound today.  This shows patent in line flow down to the ankle.  She has basically biphasic flow all the way down.  Her ABI is 0.69 she has dampened waveforms in her fifth digit  Assessment: Peripheral vascular disease with ulceration, right foot Plan: This is a very complex problem.  I feel that the patient has made a small progress with healing her ulcer from when I first intervene.  Unfortunately, she has an x-ray from about 5 weeks ago which shows with worsening osteomyelitis.  Amputation of the fifth toe has been recommended.  She is asking me for a second opinion.  While I do not disagree with amputation, I am concerned with her blood flow that she might not be able to heal an amputation, requiring a more proximal amputation.  I do feel that the wound has gotten slightly better and so I had recommended continued observation, understanding that the foot might get worse.  I would also recommending repeating the foot x-ray to see if is been any changes in her ostia.  She has been on antibiotics previously.  I have scheduled her back in 6 months  V. Charlena Cross,  M.D. [facility] [place] Office: [phone] Pager:  [phone]

## 2013-12-30 ENCOUNTER — Encounter: Payer: Self-pay | Admitting: Surgery

## 2014-01-06 ENCOUNTER — Ambulatory Visit: Payer: Medicare Other | Admitting: Surgery

## 2014-01-06 ENCOUNTER — Other Ambulatory Visit (HOSPITAL_COMMUNITY): Payer: Medicare Other

## 2014-01-13 ENCOUNTER — Encounter (HOSPITAL_COMMUNITY): Payer: Medicare Other

## 2014-01-13 ENCOUNTER — Ambulatory Visit: Payer: Medicare Other | Admitting: Surgery

## 2014-01-13 ENCOUNTER — Inpatient Hospital Stay (HOSPITAL_COMMUNITY): Admission: RE | Admit: 2014-01-13 | Payer: Medicare Other | Source: Ambulatory Visit

## 2014-01-27 ENCOUNTER — Telehealth: Payer: Self-pay | Admitting: *Deleted

## 2014-01-27 ENCOUNTER — Other Ambulatory Visit: Payer: Self-pay | Admitting: *Deleted

## 2014-01-27 DIAGNOSIS — M79609 Pain in unspecified limb: Secondary | ICD-10-CM

## 2014-01-27 NOTE — Telephone Encounter (Signed)
Mrs. [name] last seen by Dr. Myra GianottiBrabham on 12-23-2013 for follow up s/p 08-20-2013 recanalization of occluded anterior tibial artery with subsequent  ballon angioplasty (for nonhealing ulcer right 5th toe).  Mrs. [name] states that her ulcer (right 5th toe) has "almost healed."  States the ulcer has scabbed over and is about the "size of a pea."  Pt. denies redness, swelling, or drainage from right 5th toe.  Mrs. [name] states that for 3 weeks she has been experiencing severe bilateral leg pain (R>L) both at rest and when ambulating.  Reviewed pt.'s symptoms and office notes/labs with Dr. Myra GianottiBrabham who advised bringing pt. in for repeat ABIs and office visit.  Scheduler made appointments for repeat ABIs and to see NP on 02-03-2014 at 9:30AM/10:20AM.  Dr. Myra GianottiBrabham in office on 02-03-2014 and available to confer with NP if needed.  Notified pt. of appts. made  On 02-03-2014 for ABIs and office visit with NP.  Pt. Verbalized understanding.  Requested that pt. call if leg pain worsened or if she had further questions.

## 2014-01-31 ENCOUNTER — Encounter: Payer: Self-pay | Admitting: Family

## 2014-02-03 ENCOUNTER — Encounter: Payer: Self-pay | Admitting: Family

## 2014-02-03 ENCOUNTER — Ambulatory Visit (HOSPITAL_COMMUNITY)
Admission: RE | Admit: 2014-02-03 | Discharge: 2014-02-03 | Disposition: A | Discharge status: Home / Self Care | Payer: Medicare Other | Source: Ambulatory Visit | Attending: Family | Admitting: Family

## 2014-02-03 ENCOUNTER — Ambulatory Visit (INDEPENDENT_AMBULATORY_CARE_PROVIDER_SITE_OTHER): Payer: Medicare Other | Admitting: Family

## 2014-02-03 VITALS — BP 137/74 | HR 76 | Resp 16 | Ht 62.0 in | Wt 158.0 lb

## 2014-02-03 DIAGNOSIS — M79609 Pain in unspecified limb: Secondary | ICD-10-CM

## 2014-02-03 DIAGNOSIS — I10 Essential (primary) hypertension: Secondary | ICD-10-CM | POA: Insufficient documentation

## 2014-02-03 DIAGNOSIS — E119 Type 2 diabetes mellitus without complications: Secondary | ICD-10-CM | POA: Insufficient documentation

## 2014-02-03 DIAGNOSIS — I739 Peripheral vascular disease, unspecified: Secondary | ICD-10-CM

## 2014-02-03 DIAGNOSIS — E785 Hyperlipidemia, unspecified: Secondary | ICD-10-CM | POA: Insufficient documentation

## 2014-02-03 DIAGNOSIS — L98499 Non-pressure chronic ulcer of skin of other sites with unspecified severity: Principal | ICD-10-CM | POA: Insufficient documentation

## 2014-02-03 DIAGNOSIS — L97909 Non-pressure chronic ulcer of unspecified part of unspecified lower leg with unspecified severity: Secondary | ICD-10-CM | POA: Insufficient documentation

## 2014-02-03 NOTE — Progress Notes (Signed)
[facility] [place] HISTORY AND PHYSICAL -PAD  History of Present Illness [name] is a 80 y.o. female patient of Dr. Myra Gianotti who on 08/20/2013 underwent successful recanalization of an occluded anterior tibial artery with subsequent balloon angioplasty using a 3 mm balloon. This was done for a nonhealing ulcer.  She returns today for complaint of severe bilateral leg pain at rest and with walking.  She saw Dr. Myra Gianotti about 5 weeks ago who thought this a very complex problem. He felt that the patient had made a small amount of progress with healing her ulcer from when he first intervened. Unfortunately, she had an x-ray from early Dec., 2014, which showed worsening osteomyelitis. Amputation of the fifth toe had been recommended. She asked Dr. Myra Gianotti for a second opinion. While he did not disagree with amputation, I he was concerned with her blood flow that she may not be able to heal an amputation, requiring a more proximal amputation. He did feel that the wound had gotten slightly better and so had recommended continued observation, understanding that the foot might get worse. He recommended repeating the foot x-ray to see if there had been any changes in her ostia. She had been on antibiotics previously. He scheduled her back in 6 months.  She had a stroke in 1980, and a TIA in 2013, she states her neck has not had US's. She had an MI April, 2013, previous MI to that. She denies back problems, states she does have OA. She is doing home physical therapy excercises as taught to her by PT, but they are no longer seeing her. She uses a rolling walker.   Pt. reports pain from hips to feet at rest and at night, worse with walking.  The ulcer on the lateral aspect of her right foot has not drained since 2 months ago.  The patient denies New Medical or Surgical History.  Pt Diabetic: Yes, uncontrolled for a long time, per pt Pt smoker: non-smoker  Pt meds  include: Statin :Yes Betablocker: Yes ASA: Yes Other anticoagulants/antiplatelets: Plavix  Past Medical History  Diagnosis Date  . Essential hypertension, benign   . Gout   . Ischemic cardiomyopathy   . Systolic and diastolic CHF, chronic     Echo 04/05/12 EF 40-45%, anterolateral/posterior hypokinesis, grade 1 diastolic dysfunction, mild MR, mildly dilated LA  . Hyperlipidemia   . Coronary atherosclerosis of native coronary artery     s/p 4V CABG '97; NSTEMI -->Cardiac cath 04/03/12 revealed a new occlusion of SVG to OM system, Medical therapy (asa, plavix, statin, ranexa).  . Diabetes mellitus, type 2   . Seizures   . Arthritis   . Osteoporosis   . Retinal hemorrhage, both eyes   . Stroke     1980 and then 2 strokes 2012  . GERD (gastroesophageal reflux disease)   . Generalized convulsive epilepsy without mention of intractable epilepsy 10/25/2013  . Peripheral vascular disease   . Benign positional vertigo   . Gait disorder   . Closed right ankle fracture   . Diabetic peripheral neuropathy     Social History History  Substance Use Topics  . Smoking status: Never Smoker   . Smokeless tobacco: Never Used  . Alcohol Use: No    Family History Family History  Problem Relation Age of Onset  . Heart attack Father 27  . Heart disease Father     Past Surgical History  Procedure Laterality Date  . Coronary artery bypass graft  1997  . Knee surgery      bilateral  . Abdominal hysterectomy    . Cardiac catheterization      11/2010-patent grafts  . Right foot surgery      big toe removed and other surgeries on rt foot because of infection  . Left hip hemi-arthroplasty  07-31-2011    SURGERY AT [facility] FOR FRACTURE LEFT FEMORAL NECK  . Total hip revision  01/23/2012    Procedure: TOTAL HIP REVISION;  Surgeon: Shelda Pal, MD;  Location: [facility];  Service: Orthopedics;  Laterality: Left;  Conversion of  Previous Surgery to a Left Total Hip  . Abdominal hysterectomy     . Joint replacement  Jan. 2013    Left Hip  . Cardiac catheterization      7/14,8/14    Allergies  Allergen Reactions  . Ace Inhibitors Other (See Comments)    Hyperkalemia  . Cephalexin Other (See Comments)    REACTION: unknown-but on pt's list of medications that she was told she was very allergic to.  . Furosemide Other (See Comments)    presyncope  . Levofloxacin Swelling  . Vancomycin Hives and Itching  . Bactrim Rash  . Ciprocin-Fluocin-Procin [Fluocinolone Acetonide] Rash  . Ciprofloxacin Rash  . Codeine Rash  . Morphine And Related Rash  . Pentazocine Rash  . Promethazine Swelling and Rash  . Sulfa Antibiotics Swelling and Rash  . Trimethoprim Swelling and Rash    Current Outpatient Prescriptions  Medication Sig Dispense Refill  . allopurinol (ZYLOPRIM) 300 MG tablet Take 300 mg by mouth every morning.       Marland Kitchen aspirin EC 81 MG tablet Take 81 mg by mouth at bedtime.       Marland Kitchen atorvastatin (LIPITOR) 80 MG tablet Take 80 mg by mouth at bedtime.       . Biotin 1000 MCG tablet Take 1,000 mcg by mouth daily.      . Calcium Citrate (CITRACAL PO) Take 2 tablets by mouth every morning.      . carvedilol (COREG) 12.5 MG tablet Take 0.5 tablets (6.25 mg total) by mouth 2 (two) times daily with a meal.  60 tablet  0  . cholecalciferol (VITAMIN D) 1000 UNITS tablet Take 1,000 Units by mouth every morning.       . clopidogrel (PLAVIX) 75 MG tablet Take 1 tablet by mouth every morning.       . diphenhydramine-acetaminophen (TYLENOL PM) 25-500 MG TABS Take 1 tablet by mouth at bedtime.       . ferrous sulfate 325 (65 FE) MG tablet Take 325 mg by mouth 2 (two) times daily.       Marland Kitchen glyBURIDE (DIABETA) 5 MG tablet Take 5 mg by mouth daily.       . Glycerin-Hypromellose-PEG 400 (VISINE TEARS OP) Place 1 drop into both eyes daily as needed. For dry eyes      . insulin lispro (HUMALOG) 100 UNIT/ML injection Inject 5 Units into the skin 3 (three) times daily. With dinner; sliding scale       . isosorbide mononitrate (IMDUR) 30 MG 24 hr tablet Take 30 mg by mouth every morning.       . lamoTRIgine (LAMICTAL) 100 MG tablet Take 100 mg by mouth 2 (two) times daily.       Marland Kitchen LANTUS SOLOSTAR 100 UNIT/ML SOPN Inject 25 Units into the skin every morning.       . meclizine (ANTIVERT) 12.5 MG tablet Take 12.5 mg by  mouth 3 (three) times daily as needed for dizziness. For inner ear/dizziness      . nitroGLYCERIN (NITROSTAT) 0.4 MG SL tablet Place 0.4 mg under the tongue every 5 (five) minutes as needed for chest pain.      Marland Kitchen omeprazole (PRILOSEC) 20 MG capsule Take 20 mg by mouth 2 (two) times daily.       . QC PEN NEEDLES 31G X 6 MM MISC 3 (three) times daily.      Marland Kitchen torsemide (DEMADEX) 20 MG tablet Take 20 mg by mouth every morning.        No current facility-administered medications for this visit.    ROS: See HPI for pertinent positives and negatives.   Physical Examination  Filed Vitals:   02/03/14 0953  BP: 137/74  Pulse: 76  Resp: 16   Filed Weights   02/03/14 0953  Weight: 158 lb (71.668 kg)   Body mass index is 28.89 kg/(m^2).  General: A&O x 3, WDWN,. Gait: limp, using rolling walker Eyes: pupils =. Pulmonary: CTAB, without wheezes , rales or rhonchi. Cardiac: regular Rythm , without detected murmur.         Carotid Bruits Left Right   Negative Negative  Aorta is not palpable. Radial pulses: 2+ and =                           VASCULAR EXAM: Extremities with ischemic changes, healing ulcer lateral aspect right foot  without Gangrene; without drainage.                                                                                                          LE Pulses LEFT RIGHT       FEMORAL   palpable   palpable        POPLITEAL  not palpable   not palpable       POSTERIOR TIBIAL  not palpable   not palpable        DORSALIS PEDIS      ANTERIOR TIBIAL not palpable  not palpable    Abdomen: soft, NT, no masses. Skin: no rashes, dried ulcer at  lateral aspect right metatarsus, pink viable skin surrounding. Musculoskeletal: no muscle wasting or atrophy, Surgically absent right great toe.  Neurologic: A&O X 3; Appropriate Affect ; SENSATION: normal; MOTOR FUNCTION:  moving all extremities equally, motor strength 2/5 throughout. Speech is fluent/normal. CN 2-12 intact.  Non-Invasive Vascular Imaging: DATE: 12-23-2013: ABI: RIGHT 0.69, absent PT, monophasic DP;  LEFT 0.89, Waveforms: monophasic DUPLEX SCAN OF BYPASS:  >50% stenosis of the right SFA Right tibial artery occlusive disease.  The velocities in the right SFA are higher than on the previous Duplex performed on 09/30/2013.  SSESSMENT: KAMERA DUBAS is a 80 y.o. female who presents with pain from hips to feet at rest and at night, worse with walking.  Her ABI's are stable from 3 months ago: moderate arterial occlusive disease in the right leg, mild in the left leg, the rest pain she is experiencing  is more likely from her DM neuropathy, since Dr. Myra GianottiBrabham feels that the pain in her legs is not vascular related; given the long standing uncontrolled DM, consider DM neuropathy, pt states she has not yet tried gabapentin.   PLAN:  I discussed in depth with the patient the nature of atherosclerosis, and emphasized the importance of maximal medical management including strict control of blood pressure, blood glucose, and lipid levels, obtaining regular exercise, and continued cessation of smoking.  The patient is aware that without maximal medical management the underlying atherosclerotic disease process will progress, limiting the benefit of any interventions.  Based on the patient's vascular studies and examination, and after discussing with Dr. Myra GianottiBrabham, pt will return to clinic in as scheduled in about 4-5 months.  Will add carotid Doppler, history of stroke.  The patient was given information about PAD including signs, symptoms, treatment, what symptoms should prompt the patient  to seek immediate medical care, and risk reduction measures to take.  Charisse MarchSuzanne Nickel, RN, MSN, FNP-C [facility] [place] Office Phone: [phone]  Clinic MD: Myra GianottiBrabham  02/03/2014 10:08 AM

## 2014-02-03 NOTE — Patient Instructions (Addendum)
Peripheral Vascular Disease Peripheral Vascular Disease (PVD), also called Peripheral Arterial Disease (PAD), is a circulation problem caused by cholesterol (atherosclerotic plaque) deposits in the arteries. PVD commonly occurs in the lower extremities (legs) but it can occur in other areas of the body, such as your arms. The cholesterol buildup in the arteries reduces blood flow which can cause pain and other serious problems. The presence of PVD can place a person at risk for Coronary Artery Disease (CAD).  CAUSES  Causes of PVD can be many. It is usually associated with more than one risk factor such as:   High Cholesterol.  Smoking.  Diabetes.  Lack of exercise or inactivity.  High blood pressure (hypertension).  Obesity.  Family history. SYMPTOMS   When the lower extremities are affected, patients with PVD may experience:  Leg pain with exertion or physical activity. This is called INTERMITTENT CLAUDICATION. This may present as cramping or numbness with physical activity. The location of the pain is associated with the level of blockage. For example, blockage at the abdominal level (distal abdominal aorta) may result in buttock or hip pain. Lower leg arterial blockage may result in calf pain.  As PVD becomes more severe, pain can develop with less physical activity.  In people with severe PVD, leg pain may occur at rest.  Other PVD signs and symptoms:  Leg numbness or weakness.  Coldness in the affected leg or foot, especially when compared to the other leg.  A change in leg color.  Patients with significant PVD are more prone to ulcers or sores on toes, feet or legs. These may take longer to heal or may reoccur. The ulcers or sores can become infected.  If signs and symptoms of PVD are ignored, gangrene may occur. This can result in the loss of toes or loss of an entire limb.  Not all leg pain is related to PVD. Other medical conditions can cause leg pain such  as:  Blood clots (embolism) or Deep Vein Thrombosis.  Inflammation of the blood vessels (vasculitis).  Spinal stenosis. DIAGNOSIS  Diagnosis of PVD can involve several different types of tests. These can include:  Pulse Volume Recording Method (PVR). This test is simple, painless and does not involve the use of X-rays. PVR involves measuring and comparing the blood pressure in the arms and legs. An ABI (Ankle-Brachial Index) is calculated. The normal ratio of blood pressures is 1. As this number becomes smaller, it indicates more severe disease.  < 0.95  indicates significant narrowing in one or more leg vessels.  <0.8 there will usually be pain in the foot, leg or buttock with exercise.  <0.4 will usually have pain in the legs at rest.  <0.25  usually indicates limb threatening PVD.  Doppler detection of pulses in the legs. This test is painless and checks to see if you have a pulses in your legs/feet.  A dye or contrast material (a substance that highlights the blood vessels so they show up on x-ray) may be given to help your caregiver better see the arteries for the following tests. The dye is eliminated from your body by the kidney's. Your caregiver may order blood work to check your kidney function and other laboratory values before the following tests are performed:  Magnetic Resonance Angiography (MRA). An MRA is a picture study of the blood vessels and arteries. The MRA machine uses a large magnet to produce images of the blood vessels.  Computed Tomography Angiography (CTA). A CTA is a   specialized x-ray that looks at how the blood flows in your blood vessels. An IV may be inserted into your arm so contrast dye can be injected.  Angiogram. Is a procedure that uses x-rays to look at your blood vessels. This procedure is minimally invasive, meaning a small incision (cut) is made in your groin. A small tube (catheter) is then inserted into the artery of your groin. The catheter is  guided to the blood vessel or artery your caregiver wants to examine. Contrast dye is injected into the catheter. X-rays are then taken of the blood vessel or artery. After the images are obtained, the catheter is taken out. TREATMENT  Treatment of PVD involves many interventions which may include:  Lifestyle changes:  Quitting smoking.  Exercise.  Following a low fat, low cholesterol diet.  Control of diabetes.  Foot care is very important to the PVD patient. Good foot care can help prevent infection.  Medication:  Cholesterol-lowering medicine.  Blood pressure medicine.  Anti-platelet drugs.  Certain medicines may reduce symptoms of Intermittent Claudication.  Interventional/Surgical options:  Angioplasty. An Angioplasty is a procedure that inflates a balloon in the blocked artery. This opens the blocked artery to improve blood flow.  Stent Implant. A wire mesh tube (stent) is placed in the artery. The stent expands and stays in place, allowing the artery to remain open.  Peripheral Bypass Surgery. This is a surgical procedure that reroutes the blood around a blocked artery to help improve blood flow. This type of procedure may be performed if Angioplasty or stent implants are not an option. SEEK IMMEDIATE MEDICAL CARE IF:   You develop pain or numbness in your arms or legs.  Your arm or leg turns cold, becomes blue in color.  You develop redness, warmth, swelling and pain in your arms or legs. MAKE SURE YOU:   Understand these instructions.  Will watch your condition.  Will get help right away if you are not doing well or get worse. Document Released: 01/19/2005 Document Revised: 03/05/2012 Document Reviewed: 12/16/2008 Exit[employer] Patient Information 2014 [employer].   Stroke Prevention Some medical conditions and behaviors are associated with an increased chance of having a stroke. You may prevent a stroke by making healthy choices and managing medical  conditions. HOW CAN I REDUCE MY RISK OF HAVING A STROKE?   Stay physically active. Get at least 30 minutes of activity on most or all days.  Do not smoke. It may also be helpful to avoid exposure to secondhand smoke.  Limit alcohol use. Moderate alcohol use is considered to be:  No more than 2 drinks per day for men.  No more than 1 drink per day for nonpregnant women.  Eat healthy foods. This involves  Eating 5 or more servings of fruits and vegetables a day.  Following a diet that addresses high blood pressure (hypertension), high cholesterol, diabetes, or obesity.  Manage your cholesterol levels.  A diet low in saturated fat, trans fat, and cholesterol and high in fiber may control cholesterol levels.  Take any prescribed medicines to control cholesterol as directed by your health care provider.  Manage your diabetes.  A controlled-carbohydrate, controlled-sugar diet is recommended to manage diabetes.  Take any prescribed medicines to control diabetes as directed by your health care provider.  Control your hypertension.  A low-salt (sodium), low-saturated fat, low-trans fat, and low-cholesterol diet is recommended to manage hypertension.  Take any prescribed medicines to control hypertension as directed by your health care provider.    Maintain a healthy weight.  A reduced-calorie, low-sodium, low-saturated fat, low-trans fat, low-cholesterol diet is recommended to manage weight.  Stop drug abuse.  Avoid taking birth control pills.  Talk to your health care provider about the risks of taking birth control pills if you are over 35 years old, smoke, get migraines, or have ever had a blood clot.  Get evaluated for sleep disorders (sleep apnea).  Talk to your health care provider about getting a sleep evaluation if you snore a lot or have excessive sleepiness.  Take medicines as directed by your health care provider.  For some people, aspirin or blood thinners  (anticoagulants) are helpful in reducing the risk of forming abnormal blood clots that can lead to stroke. If you have the irregular heart rhythm of atrial fibrillation, you should be on a blood thinner unless there is a good reason you cannot take them.  Understand all your medicine instructions.  Make sure that other other conditions (such as anemia or atherosclerosis) are addressed. SEEK IMMEDIATE MEDICAL CARE IF:   You have sudden weakness or numbness of the face, arm, or leg, especially on one side of the body.  Your face or eyelid droops to one side.  You have sudden confusion.  You have trouble speaking (aphasia) or understanding.  You have sudden trouble seeing in one or both eyes.  You have sudden trouble walking.  You have dizziness.  You have a loss of balance or coordination.  You have a sudden, severe headache with no known cause.  You have new chest pain or an irregular heartbeat. Any of these symptoms may represent a serious problem that is an emergency. Do not wait to see if the symptoms will go away. Get medical help at once. Call your local emergency services  (911 in [place] Do not drive yourself to the hospital. Document Released: 01/19/2005 Document Revised: 10/02/2013 Document Reviewed: 06/14/2013 [employer] Patient Information 2014 [employer].  

## 2014-02-04 ENCOUNTER — Other Ambulatory Visit: Payer: Self-pay

## 2014-02-04 MED ORDER — LAMOTRIGINE 100 MG PO TABS: 100.0000 mg | ORAL_TABLET | Freq: Two times a day (BID) | ORAL | Status: DC

## 2014-03-25 ENCOUNTER — Emergency Department (HOSPITAL_COMMUNITY): Payer: Medicare Other

## 2014-03-25 ENCOUNTER — Emergency Department (HOSPITAL_COMMUNITY)
Admission: EM | Admit: 2014-03-25 | Discharge: 2014-03-26 | Disposition: A | Discharge status: Home / Self Care | Payer: Medicare Other | Attending: Emergency Medicine | Admitting: Emergency Medicine

## 2014-03-25 ENCOUNTER — Telehealth: Payer: Self-pay

## 2014-03-25 ENCOUNTER — Encounter (HOSPITAL_COMMUNITY): Payer: Self-pay | Admitting: Emergency Medicine

## 2014-03-25 DIAGNOSIS — Y939 Activity, unspecified: Secondary | ICD-10-CM | POA: Insufficient documentation

## 2014-03-25 DIAGNOSIS — H538 Other visual disturbances: Secondary | ICD-10-CM | POA: Insufficient documentation

## 2014-03-25 DIAGNOSIS — IMO0002 Reserved for concepts with insufficient information to code with codable children: Secondary | ICD-10-CM | POA: Insufficient documentation

## 2014-03-25 DIAGNOSIS — I2510 Atherosclerotic heart disease of native coronary artery without angina pectoris: Secondary | ICD-10-CM | POA: Insufficient documentation

## 2014-03-25 DIAGNOSIS — L97509 Non-pressure chronic ulcer of other part of unspecified foot with unspecified severity: Secondary | ICD-10-CM | POA: Insufficient documentation

## 2014-03-25 DIAGNOSIS — E1142 Type 2 diabetes mellitus with diabetic polyneuropathy: Secondary | ICD-10-CM | POA: Insufficient documentation

## 2014-03-25 DIAGNOSIS — G40309 Generalized idiopathic epilepsy and epileptic syndromes, not intractable, without status epilepticus: Secondary | ICD-10-CM | POA: Insufficient documentation

## 2014-03-25 DIAGNOSIS — R071 Chest pain on breathing: Secondary | ICD-10-CM | POA: Insufficient documentation

## 2014-03-25 DIAGNOSIS — M109 Gout, unspecified: Secondary | ICD-10-CM | POA: Insufficient documentation

## 2014-03-25 DIAGNOSIS — Z951 Presence of aortocoronary bypass graft: Secondary | ICD-10-CM | POA: Insufficient documentation

## 2014-03-25 DIAGNOSIS — Z7902 Long term (current) use of antithrombotics/antiplatelets: Secondary | ICD-10-CM | POA: Insufficient documentation

## 2014-03-25 DIAGNOSIS — R42 Dizziness and giddiness: Secondary | ICD-10-CM | POA: Insufficient documentation

## 2014-03-25 DIAGNOSIS — Y929 Unspecified place or not applicable: Secondary | ICD-10-CM | POA: Insufficient documentation

## 2014-03-25 DIAGNOSIS — K219 Gastro-esophageal reflux disease without esophagitis: Secondary | ICD-10-CM | POA: Insufficient documentation

## 2014-03-25 DIAGNOSIS — R079 Chest pain, unspecified: Secondary | ICD-10-CM

## 2014-03-25 DIAGNOSIS — G8929 Other chronic pain: Secondary | ICD-10-CM | POA: Insufficient documentation

## 2014-03-25 DIAGNOSIS — I252 Old myocardial infarction: Secondary | ICD-10-CM | POA: Insufficient documentation

## 2014-03-25 DIAGNOSIS — E1149 Type 2 diabetes mellitus with other diabetic neurological complication: Secondary | ICD-10-CM | POA: Insufficient documentation

## 2014-03-25 DIAGNOSIS — M810 Age-related osteoporosis without current pathological fracture: Secondary | ICD-10-CM | POA: Insufficient documentation

## 2014-03-25 DIAGNOSIS — I739 Peripheral vascular disease, unspecified: Secondary | ICD-10-CM | POA: Insufficient documentation

## 2014-03-25 DIAGNOSIS — Z794 Long term (current) use of insulin: Secondary | ICD-10-CM | POA: Insufficient documentation

## 2014-03-25 DIAGNOSIS — Z7982 Long term (current) use of aspirin: Secondary | ICD-10-CM | POA: Insufficient documentation

## 2014-03-25 DIAGNOSIS — Z8781 Personal history of (healed) traumatic fracture: Secondary | ICD-10-CM | POA: Insufficient documentation

## 2014-03-25 DIAGNOSIS — M9080 Osteopathy in diseases classified elsewhere, unspecified site: Secondary | ICD-10-CM | POA: Insufficient documentation

## 2014-03-25 DIAGNOSIS — G629 Polyneuropathy, unspecified: Secondary | ICD-10-CM

## 2014-03-25 DIAGNOSIS — M7989 Other specified soft tissue disorders: Secondary | ICD-10-CM | POA: Insufficient documentation

## 2014-03-25 DIAGNOSIS — I10 Essential (primary) hypertension: Secondary | ICD-10-CM | POA: Insufficient documentation

## 2014-03-25 DIAGNOSIS — I5042 Chronic combined systolic (congestive) and diastolic (congestive) heart failure: Secondary | ICD-10-CM | POA: Insufficient documentation

## 2014-03-25 DIAGNOSIS — E1169 Type 2 diabetes mellitus with other specified complication: Secondary | ICD-10-CM | POA: Insufficient documentation

## 2014-03-25 DIAGNOSIS — Z8673 Personal history of transient ischemic attack (TIA), and cerebral infarction without residual deficits: Secondary | ICD-10-CM | POA: Insufficient documentation

## 2014-03-25 DIAGNOSIS — Z79899 Other long term (current) drug therapy: Secondary | ICD-10-CM | POA: Insufficient documentation

## 2014-03-25 DIAGNOSIS — X58XXXA Exposure to other specified factors, initial encounter: Secondary | ICD-10-CM | POA: Insufficient documentation

## 2014-03-25 DIAGNOSIS — S98139A Complete traumatic amputation of one unspecified lesser toe, initial encounter: Secondary | ICD-10-CM | POA: Insufficient documentation

## 2014-03-25 LAB — BASIC METABOLIC PANEL
BUN: 13 mg/dL (ref 6–23)
CALCIUM: 9.2 mg/dL (ref 8.4–10.5)
CO2: 24 mEq/L (ref 19–32)
CREATININE: 1.04 mg/dL (ref 0.50–1.10)
Chloride: 91 mEq/L — ABNORMAL LOW (ref 96–112)
GFR, EST AFRICAN AMERICAN: 57 mL/min — AB (ref >90)
GFR, EST NON AFRICAN AMERICAN: 49 mL/min — AB (ref >90)
Glucose, Bld: 361 mg/dL — ABNORMAL HIGH (ref 70–99)
Potassium: 4.7 mEq/L (ref 3.7–5.3)
Sodium: 130 mEq/L — ABNORMAL LOW (ref 137–147)

## 2014-03-25 LAB — CBC
HCT: 36.9 % (ref 36.0–46.0)
Hemoglobin: 13.4 g/dL (ref 12.0–15.0)
MCH: 30.7 pg (ref 26.0–34.0)
MCHC: 36.3 g/dL — AB (ref 30.0–36.0)
MCV: 84.6 fL (ref 78.0–100.0)
PLATELETS: 292 10*3/uL (ref 150–400)
RBC: 4.36 MIL/uL (ref 3.87–5.11)
RDW: 11.7 % (ref 11.5–15.5)
WBC: 13.5 10*3/uL — AB (ref 4.0–10.5)

## 2014-03-25 LAB — I-STAT TROPONIN, ED
TROPONIN I, POC: 0.02 ng/mL (ref 0.00–0.08)
Troponin i, poc: 0.01 ng/mL (ref 0.00–0.08)

## 2014-03-25 MED ORDER — FENTANYL CITRATE 0.05 MG/ML IJ SOLN
50.0000 ug | Freq: Once | INTRAMUSCULAR | Status: AC
  Administered 2014-03-25: 50 ug via INTRAVENOUS
  Filled 2014-03-25: qty 2

## 2014-03-25 NOTE — Telephone Encounter (Signed)
Phone call from pt. C/o "terrible pain" in her legs and feet.  Reports the pain occurs with both activity and at rest.   Stated her pain is getting worse.  Denies any swelling in her lower extremities.  Denies any open sores.  Reports, also, that her arms and legs get numb at times.  Referred to last office note of 02/03/14; it was noted that due to stable ABI's, her pain was not likely related to a vascular problem, but possibly r/t Diabetic Neuropathy.  Discussed pt's complaints with Charisse MarchSuzanne Nickel, RN, NP.  Was advised that "pt. should report the symptoms to her PCP, because the pain is not related to her vascular disease."  Phone call to pt., and advised of the recommendation per the nurse practitioner.  Pt. stated "there is something wrong because I can hardly walk"; stated she is going to call Dr. Diona BrownerMcDowell tomorrow.

## 2014-03-25 NOTE — ED Notes (Addendum)
Pt. reports mid chest pain onset yesterday with pain at legs , hips and arms . Pt. stated history CAD / CABG , neuropathy and PVD .

## 2014-03-25 NOTE — ED Provider Notes (Signed)
CSN: [id number]     Arrival date & time 03/25/14  2054 History   First MD Initiated Contact with Patient 03/25/14 2255     Chief Complaint  Patient presents with  . Chest Pain     (Consider location/radiation/quality/duration/timing/severity/associated sxs/prior Treatment) HPI Comments: 80 year old female with complicated medical history including epilepsy, coronary artery disease, bypass x3, cardiomyopathy, peripheral vascular disease, diabetic osteomyelitis, neuropathy presents with diffuse extremity pains and chest pain. Patient has had worsening arm and leg pain bilateral for the past 2-3 months. Patient has been following with vascular disease physician and primary doctor. Patient recently placed on Neurontin to help with pain however no significant improvement.  The past 2-3 days patient has had brief anterior chest pain with mild radiation to the left shoulder. This pain is lasting seconds and only occurs when her other extremity pain is very severe. She has no diaphoresis or exertional component to this. She currently has no chest pain in ED. She follows the [facility] cardiology. She has been told by her specialists that she is medical management and no further stent or bypass with improve her cardiac status. Families with patient in the room. He is on Plavix and aspirin, has not missed medicines.  Patient is a 80 y.o. female presenting with chest pain. The history is provided by the patient and a relative.  Chest Pain Associated symptoms: no abdominal pain, no back pain, no cough, no fever, no headache, no shortness of breath and not vomiting     Past Medical History  Diagnosis Date  . Essential hypertension, benign   . Gout   . Ischemic cardiomyopathy   . Systolic and diastolic CHF, chronic     Echo 04/05/12 EF 40-45%, anterolateral/posterior hypokinesis, grade 1 diastolic dysfunction, mild MR, mildly dilated LA  . Hyperlipidemia   . Coronary atherosclerosis of native coronary artery      s/p 4V CABG '97; NSTEMI -->Cardiac cath 04/03/12 revealed a new occlusion of SVG to OM system, Medical therapy (asa, plavix, statin, ranexa).  . Diabetes mellitus, type 2   . Seizures   . Arthritis   . Osteoporosis   . Retinal hemorrhage, both eyes   . Stroke     1980 and then 2 strokes 2012  . GERD (gastroesophageal reflux disease)   . Generalized convulsive epilepsy without mention of intractable epilepsy 10/25/2013  . Peripheral vascular disease   . Benign positional vertigo   . Gait disorder   . Closed right ankle fracture   . Diabetic peripheral neuropathy    Past Surgical History  Procedure Laterality Date  . Coronary artery bypass graft      1997  . Knee surgery      bilateral  . Abdominal hysterectomy    . Cardiac catheterization      11/2010-patent grafts  . Right foot surgery      big toe removed and other surgeries on rt foot because of infection  . Left hip hemi-arthroplasty  07-31-2011    SURGERY AT [facility] FOR FRACTURE LEFT FEMORAL NECK  . Total hip revision  01/23/2012    Procedure: TOTAL HIP REVISION;  Surgeon: Shelda Pal, MD;  Location: [facility] ORS;  Service: Orthopedics;  Laterality: Left;  Conversion of  Previous Surgery to a Left Total Hip  . Abdominal hysterectomy    . Joint replacement  Jan. 2013    Left Hip  . Cardiac catheterization      7/14,8/14   Family History  Problem Relation Age of Onset  .  Heart attack Father 867  . Heart disease Father    History  Substance Use Topics  . Smoking status: Never Smoker   . Smokeless tobacco: Never Used  . Alcohol Use: No   OB History   Grav Para Term Preterm Abortions TAB SAB Ect Mult Living                 Review of Systems  Constitutional: Negative for fever and chills.  HENT: Negative for congestion.   Eyes: Positive for visual disturbance (chronic).  Respiratory: Negative for cough and shortness of breath.   Cardiovascular: Positive for chest pain and leg swelling (mild chronic).   Gastrointestinal: Negative for vomiting and abdominal pain.  Genitourinary: Negative for dysuria and flank pain.  Musculoskeletal: Positive for arthralgias. Negative for back pain, neck pain and neck stiffness.  Skin: Negative for rash.  Neurological: Positive for light-headedness. Negative for headaches.      Allergies  Ace inhibitors; Cephalexin; Furosemide; Levofloxacin; Vancomycin; Bactrim; Ciprocin-fluocin-procin; Ciprofloxacin; Codeine; Morphine and related; Pentazocine; Promethazine; Sulfa antibiotics; and Trimethoprim  Home Medications   Current Outpatient Rx  Name  Route  Sig  Dispense  Refill  . allopurinol (ZYLOPRIM) 300 MG tablet   Oral   Take 300 mg by mouth every morning.          Marland Kitchen. aspirin EC 81 MG tablet   Oral   Take 81 mg by mouth at bedtime.          Marland Kitchen. atorvastatin (LIPITOR) 80 MG tablet   Oral   Take 80 mg by mouth at bedtime.          . Biotin 1000 MCG tablet   Oral   Take 1,000 mcg by mouth daily.         . Calcium Citrate (CITRACAL PO)   Oral   Take 2 tablets by mouth every morning.         . carvedilol (COREG) 12.5 MG tablet   Oral   Take 0.5 tablets (6.25 mg total) by mouth 2 (two) times daily with a meal.   60 tablet   0   . cholecalciferol (VITAMIN D) 1000 UNITS tablet   Oral   Take 1,000 Units by mouth every morning.          . clopidogrel (PLAVIX) 75 MG tablet   Oral   Take 1 tablet by mouth every morning.          . diphenhydramine-acetaminophen (TYLENOL PM) 25-500 MG TABS   Oral   Take 1 tablet by mouth at bedtime.          . ferrous sulfate 325 (65 FE) MG tablet   Oral   Take 325 mg by mouth 2 (two) times daily.          Marland Kitchen. gabapentin (NEURONTIN) 100 MG capsule   Oral   Take 100 mg by mouth 2 (two) times daily.         Marland Kitchen. glyBURIDE (DIABETA) 5 MG tablet   Oral   Take 5 mg by mouth daily.          . Glycerin-Hypromellose-PEG 400 (VISINE TEARS OP)   Both Eyes   Place 1 drop into both eyes daily as  needed. For dry eyes         . insulin aspart (NOVOLOG) 100 UNIT/ML injection   Subcutaneous   Inject 5-10 Units into the skin 3 (three) times daily before meals. Use 5 units every morning and at lunch and use  10 units at night         . isosorbide mononitrate (IMDUR) 30 MG 24 hr tablet   Oral   Take 30 mg by mouth every morning.          . lamoTRIgine (LAMICTAL) 100 MG tablet   Oral   Take 1 tablet (100 mg total) by mouth 2 (two) times daily.   180 tablet   2   . LANTUS SOLOSTAR 100 UNIT/ML SOPN   Subcutaneous   Inject 20 Units into the skin every morning.          . meclizine (ANTIVERT) 12.5 MG tablet   Oral   Take 12.5 mg by mouth 3 (three) times daily as needed for dizziness. For inner ear/dizziness         . nitroGLYCERIN (NITROSTAT) 0.4 MG SL tablet   Sublingual   Place 0.4 mg under the tongue every 5 (five) minutes as needed for chest pain.         Marland Kitchen omeprazole (PRILOSEC) 20 MG capsule   Oral   Take 20 mg by mouth 2 (two) times daily.          Marland Kitchen torsemide (DEMADEX) 20 MG tablet   Oral   Take 20 mg by mouth every morning.          . QC PEN NEEDLES 31G X 6 MM MISC      3 (three) times daily.          BP 160/62  Pulse 78  Temp(Src) 98.3 F (36.8 C) (Oral)  Resp 18  Ht 5\' 4"  (1.626 m)  Wt 157 lb (71.215 kg)  BMI 26.94 kg/m2  SpO2 100% Physical Exam  Nursing note and vitals reviewed. Constitutional: She is oriented to person, place, and time. She appears well-developed and well-nourished.  HENT:  Head: Normocephalic and atraumatic.  Eyes: Conjunctivae are normal. Right eye exhibits no discharge. Left eye exhibits no discharge.  Neck: Normal range of motion. Neck supple. No tracheal deviation present.  Cardiovascular: Normal rate and regular rhythm.   2+ upper extremity pulses bilateral. 1+ lower extremity pulses bilateral Refill 3 seconds bilateral.  Pulmonary/Chest: Effort normal and breath sounds normal.  Abdominal: Soft. She exhibits  no distension. There is no tenderness. There is no guarding.  Musculoskeletal: She exhibits no edema.  Neurological: She is alert and oriented to person, place, and time.  Skin: Skin is warm. No rash noted.  Toe amputation, superficial ulcerations and abrasions to bilateral feet, no acute infection.  Psychiatric: She has a normal mood and affect.    ED Course  Procedures (including critical care time) Labs Review Labs Reviewed  CBC - Abnormal; Notable for the following:    WBC 13.5 (*)    MCHC 36.3 (*)    All other components within normal limits  BASIC METABOLIC PANEL - Abnormal; Notable for the following:    Sodium 130 (*)    Chloride 91 (*)    Glucose, Bld 361 (*)    GFR calc non Af Amer 49 (*)    GFR calc Af Amer 57 (*)    All other components within normal limits  I-STAT TROPOININ, ED  Rosezena Sensor, ED   Imaging Review Dg Chest 2 View  03/25/2014   CLINICAL DATA:  Midchest pain  EXAM: CHEST  2 VIEW  COMPARISON:  Prior chest x-ray 04/01/2012  FINDINGS: Stable cardiac and mediastinal contours. Status post median sternotomy with evidence of prior CABG including LIMA bypass. Stable bronchitic  changes and diffuse mild interstitial prominence. No suspicious pulmonary nodule, focal airspace consolidation, pulmonary edema, pleural effusion or pneumothorax No acute osseous abnormality. The lungs remain hyperexpanded. Degenerative spurring noted throughout the thoracolumbar spine.  IMPRESSION: Stable chest x-ray without evidence of acute cardiopulmonary process.   Electronically Signed   By: Malachy Moan M.D.   On: 03/25/2014 21:46     EKG Interpretation   Date/Time:  Tuesday March 25 2014 21:07:24 EDT Ventricular Rate:  75 PR Interval:  180 QRS Duration: 130 QT Interval:  422 QTC Calculation: 471 R Axis:   26 Text Interpretation:  Sinus rhythm with occasional Premature ventricular  complexes Right bundle branch block Abnormal ECG similar to previous  Confirmed by  Destan Franchini  MD, Dawsyn Zurn ([id number]) on 03/25/2014 11:01:04 PM      MDM   Final diagnoses:  Chest pain  Neuropathy   Patient with known vascular disease and complicated cardiac history. Although she is higher risk with her known disease her presentation is very atypical with brief chest pain only when her neuropathy and extremity pain is very severe. She is medical management per her and her family. She is already on blood thinners and taking regularly. Discussion with family in terms of what is the best care for her in the ED today. We all agree we want to rule out sign of acute heart attack with delta troponin and ensure no EKG changes. Trial of fentanyl and ED. Patient has multiple allergies which has limited her pain control outpatient. If fentanyl improves pain and ED and no acute cardiac findings patient comfortable with close outpatient followup to discuss fentanyl patch with her primary care provider. EKG reviewed right bundle branch block similar to previous. Fentanyl improved pain, no chest pain on recheck. Discussed with cardiology on call, reviewed results/ ekg, recommended close outpt fup.  Discussed with pt and family, will fup outpt with cardiology and pcp.   Results and differential diagnosis were discussed with the patient. Close follow up outpatient was discussed, patient comfortable with the plan.   Filed Vitals:   03/26/14 0030 03/26/14 0103 03/26/14 0130 03/26/14 0147  BP: 156/72 135/97 120/56   Pulse: 66  65   Temp:    98 F (36.7 C)  TempSrc:    Oral  Resp: 18  19   Height:      Weight:      SpO2: 96%  96%           Enid Skeens, MD 03/28/14 2102

## 2014-03-26 NOTE — Discharge Instructions (Signed)
Discuss fentanyl patch with your doctor. Call cardiology for follow up.  If you were given medicines take as directed.  If you are on coumadin or contraceptives realize their levels and effectiveness is altered by many different medicines.  If you have any reaction (rash, tongues swelling, other) to the medicines stop taking and see a physician.   Please follow up as directed and return to the ER or see a physician for new or worsening symptoms (fever, chest pain, passing out, other).  Thank you. Filed Vitals:   03/25/14 2218 03/25/14 2230 03/25/14 2345 03/26/14 0103  BP: 160/62 155/68 157/61 135/97  Pulse:  76 71   Temp:      TempSrc:      Resp:  16 12   Height:      Weight:      SpO2: 100% 99% 97%

## 2014-05-23 ENCOUNTER — Ambulatory Visit (INDEPENDENT_AMBULATORY_CARE_PROVIDER_SITE_OTHER): Payer: Medicare Other | Admitting: Ophthalmology

## 2014-05-23 DIAGNOSIS — E1139 Type 2 diabetes mellitus with other diabetic ophthalmic complication: Secondary | ICD-10-CM

## 2014-05-23 DIAGNOSIS — H35039 Hypertensive retinopathy, unspecified eye: Secondary | ICD-10-CM

## 2014-05-23 DIAGNOSIS — H4310 Vitreous hemorrhage, unspecified eye: Secondary | ICD-10-CM

## 2014-05-23 DIAGNOSIS — E11359 Type 2 diabetes mellitus with proliferative diabetic retinopathy without macular edema: Secondary | ICD-10-CM

## 2014-05-23 DIAGNOSIS — E11319 Type 2 diabetes mellitus with unspecified diabetic retinopathy without macular edema: Secondary | ICD-10-CM

## 2014-05-23 DIAGNOSIS — I10 Essential (primary) hypertension: Secondary | ICD-10-CM

## 2014-05-23 DIAGNOSIS — E1165 Type 2 diabetes mellitus with hyperglycemia: Secondary | ICD-10-CM

## 2014-06-13 ENCOUNTER — Encounter: Payer: Self-pay | Admitting: Surgery

## 2014-06-16 ENCOUNTER — Other Ambulatory Visit (HOSPITAL_COMMUNITY): Payer: Medicare Other

## 2014-06-16 ENCOUNTER — Ambulatory Visit (HOSPITAL_COMMUNITY)
Admission: RE | Admit: 2014-06-16 | Discharge: 2014-06-16 | Disposition: A | Discharge status: Home / Self Care | Payer: Medicare Other | Source: Ambulatory Visit | Attending: Surgery | Admitting: Surgery

## 2014-06-16 ENCOUNTER — Ambulatory Visit: Payer: Medicare Other | Admitting: Surgery

## 2014-06-16 ENCOUNTER — Ambulatory Visit (INDEPENDENT_AMBULATORY_CARE_PROVIDER_SITE_OTHER): Payer: Medicare Other | Admitting: Surgery

## 2014-06-16 ENCOUNTER — Encounter: Payer: Self-pay | Admitting: Surgery

## 2014-06-16 ENCOUNTER — Ambulatory Visit (INDEPENDENT_AMBULATORY_CARE_PROVIDER_SITE_OTHER)
Admission: RE | Admit: 2014-06-16 | Discharge: 2014-06-16 | Disposition: A | Discharge status: Home / Self Care | Payer: Medicare Other | Source: Ambulatory Visit | Attending: Surgery | Admitting: Surgery

## 2014-06-16 ENCOUNTER — Ambulatory Visit (INDEPENDENT_AMBULATORY_CARE_PROVIDER_SITE_OTHER)
Admission: RE | Admit: 2014-06-16 | Discharge: 2014-06-16 | Disposition: A | Discharge status: Home / Self Care | Payer: Medicare Other | Source: Ambulatory Visit | Attending: Family | Admitting: Family

## 2014-06-16 ENCOUNTER — Encounter (HOSPITAL_COMMUNITY): Payer: Medicare Other

## 2014-06-16 VITALS — BP 161/63 | HR 68 | Ht 64.0 in | Wt 157.0 lb

## 2014-06-16 DIAGNOSIS — L98499 Non-pressure chronic ulcer of skin of other sites with unspecified severity: Secondary | ICD-10-CM

## 2014-06-16 DIAGNOSIS — I739 Peripheral vascular disease, unspecified: Secondary | ICD-10-CM

## 2014-06-16 DIAGNOSIS — Z48812 Encounter for surgical aftercare following surgery on the circulatory system: Secondary | ICD-10-CM | POA: Insufficient documentation

## 2014-06-16 DIAGNOSIS — M79609 Pain in unspecified limb: Secondary | ICD-10-CM

## 2014-06-16 NOTE — Progress Notes (Signed)
Patient name: [name] MRN: [id number] DOB: 12/12/1931 Sex: female     Chief Complaint  Patient presents with  . Re-evaluation    6 month f/u     HISTORY OF PRESENT ILLNESS: The patient is back today for followup. On 08/20/2013 she underwent successful recanalization of an occluded anterior tibial artery with subsequent balloon angioplasty using a 3 mm balloon. This was done for a nonhealing ulcer.  She is complaining of 5 pain which happens at night and during the day.   Past Medical History  Diagnosis Date  . Essential hypertension, benign   . Gout   . Ischemic cardiomyopathy   . Systolic and diastolic CHF, chronic     Echo 04/05/12 EF 40-45%, anterolateral/posterior hypokinesis, grade 1 diastolic dysfunction, mild MR, mildly dilated LA  . Hyperlipidemia   . Coronary atherosclerosis of native coronary artery     s/p 4V CABG '97; NSTEMI -->Cardiac cath 04/03/12 revealed a new occlusion of SVG to OM system, Medical therapy (asa, plavix, statin, ranexa).  . Diabetes mellitus, type 2   . Seizures   . Arthritis   . Osteoporosis   . Retinal hemorrhage, both eyes   . Stroke     1980 and then 2 strokes 2012  . GERD (gastroesophageal reflux disease)   . Generalized convulsive epilepsy without mention of intractable epilepsy 10/25/2013  . Peripheral vascular disease   . Benign positional vertigo   . Gait disorder   . Closed right ankle fracture   . Diabetic peripheral neuropathy     Past Surgical History  Procedure Laterality Date  . Coronary artery bypass graft      1997  . Knee surgery      bilateral  . Abdominal hysterectomy    . Cardiac catheterization      11/2010-patent grafts  . Right foot surgery      big toe removed and other surgeries on rt foot because of infection  . Left hip hemi-arthroplasty  07-31-2011    [facility] FOR FRACTURE LEFT FEMORAL NECK  . Total hip revision  01/23/2012    Procedure: TOTAL HIP REVISION;  Surgeon: Shelda PalMatthew D Olin, MD;   Location: [facility];  Service: Orthopedics;  Laterality: Left;  Conversion of  Previous Surgery to a Left Total Hip  . Abdominal hysterectomy    . Joint replacement  Jan. 2013    Left Hip  . Cardiac catheterization      7/14,8/14    History   Social History  . Marital Status: Widowed    Spouse Name: N/A    Number of Children: 1  . Years of Education: 10 th   Occupational History  .     Social History Main Topics  . Smoking status: Never Smoker   . Smokeless tobacco: Never Used  . Alcohol Use: No  . Drug Use: No  . Sexual Activity: Not Currently    Birth Control/ Protection: Post-menopausal   Other Topics Concern  . Not on file   Social History Narrative  . No narrative on file    Family History  Problem Relation Age of Onset  . Heart attack Father [id number]  . Heart disease Father   . Vision loss Mother   . Diabetes Sister   . Heart disease Sister   . Hyperlipidemia Sister   . Diabetes Brother   . Heart disease Brother     before age 78  . Hyperlipidemia Brother   . Heart disease  Daughter     before age 16  . Diabetes Sister   . Heart disease Sister   . Hyperlipidemia Sister   . Diabetes Sister   . Heart disease Sister   . Diabetes Sister   . Diabetes Brother   . Heart disease Brother     before age 66    Allergies as of 06/16/2014 - Review Complete 06/16/2014  Allergen Reaction Noted  . Ace inhibitors Other (See Comments) 10/20/2011  . Cephalexin Other (See Comments) 01/18/2012  . Furosemide Other (See Comments) 04/13/2011  . Levofloxacin Swelling 10/20/2011  . Vancomycin Hives and Itching 07/11/2013  . Bactrim Rash 04/13/2011  . Ciprocin-fluocin-procin [fluocinolone acetonide] Rash 04/13/2011  . Ciprofloxacin Rash 01/11/2012  . Codeine Rash 04/13/2011  . Morphine and related Rash 04/13/2011  . Pentazocine Rash 04/13/2011  . Promethazine Swelling and Rash 01/11/2012  . Sulfa antibiotics Swelling and Rash 01/11/2012  . Trimethoprim Swelling and Rash  01/11/2012    Current Outpatient Prescriptions on File Prior to Visit  Medication Sig Dispense Refill  . allopurinol (ZYLOPRIM) 300 MG tablet Take 300 mg by mouth every morning.       Marland Kitchen aspirin EC 81 MG tablet Take 81 mg by mouth at bedtime.       Marland Kitchen atorvastatin (LIPITOR) 80 MG tablet Take 80 mg by mouth at bedtime.       . Biotin 1000 MCG tablet Take 1,000 mcg by mouth daily.      . Calcium Citrate (CITRACAL PO) Take 2 tablets by mouth every morning.      . carvedilol (COREG) 12.5 MG tablet Take 0.5 tablets (6.25 mg total) by mouth 2 (two) times daily with a meal.  60 tablet  0  . cholecalciferol (VITAMIN D) 1000 UNITS tablet Take 1,000 Units by mouth every morning.       . clopidogrel (PLAVIX) 75 MG tablet Take 1 tablet by mouth every morning.       . diphenhydramine-acetaminophen (TYLENOL PM) 25-500 MG TABS Take 1 tablet by mouth at bedtime.       . ferrous sulfate 325 (65 FE) MG tablet Take 325 mg by mouth 2 (two) times daily.       Marland Kitchen gabapentin (NEURONTIN) 100 MG capsule Take 100 mg by mouth 2 (two) times daily.      . Glycerin-Hypromellose-PEG 400 (VISINE TEARS OP) Place 1 drop into both eyes daily as needed. For dry eyes      . insulin aspart (NOVOLOG) 100 UNIT/ML injection Inject 5-10 Units into the skin 3 (three) times daily before meals. Use 5 units every morning and at lunch and use 10 units at night      . isosorbide mononitrate (IMDUR) 30 MG 24 hr tablet Take 30 mg by mouth every morning.       . lamoTRIgine (LAMICTAL) 100 MG tablet Take 1 tablet (100 mg total) by mouth 2 (two) times daily.  180 tablet  2  . LANTUS SOLOSTAR 100 UNIT/ML SOPN Inject 20 Units into the skin every morning.       . meclizine (ANTIVERT) 12.5 MG tablet Take 12.5 mg by mouth 3 (three) times daily as needed for dizziness. For inner ear/dizziness      . nitroGLYCERIN (NITROSTAT) 0.4 MG SL tablet Place 0.4 mg under the tongue every 5 (five) minutes as needed for chest pain.      Marland Kitchen omeprazole (PRILOSEC) 20 MG  capsule Take 20 mg by mouth 2 (two) times daily.       Marland Kitchen  QC PEN NEEDLES 31G X 6 MM MISC 3 (three) times daily.      Marland Kitchen. torsemide (DEMADEX) 20 MG tablet Take 20 mg by mouth every morning.       . glyBURIDE (DIABETA) 5 MG tablet Take 5 mg by mouth daily.        No current facility-administered medications on file prior to visit.     REVIEW OF SYSTEMS: Cardiovascular:  Positive for chest pain, shortness of breath when lying flat, pain in legs and walking, pain in legs when lying flat, leg swelling Pulmonary: No productive cough, asthma or wheezing. Neurologic: No weakness, paresthesias, aphasia, or amaurosis. No dizziness. Hematologic: Positive for weakness and numbness in the legs, difficulty with speaking, dizziness. Musculoskeletal: No joint pain or joint swelling. Gastrointestinal: No blood in stool or hematemesis Genitourinary: No dysuria or hematuria. Psychiatric:: No history of major depression. Integumentary: Positive for rash, no ulcers. Constitutional: No fever or chills.  PHYSICAL EXAMINATION:   Vital signs are BP 161/63  Pulse 68  Ht 5\' 4"  (1.626 m)  Wt 157 lb (71.215 kg)  BMI 26.94 kg/m2  SpO2 100% General: The patient appears their stated age. HEENT:  No gross abnormalities Pulmonary:  Non labored breathing Abdomen: Soft and non-tender Musculoskeletal: There are no major deformities. Neurologic: No focal weakness or paresthesias are detected, Skin: There are no ulcer or rashes noted.  The area on the right fifth metatarsal head has essentially healed  Psychiatric: The patient has normal affect. Cardiovascular: There is a regular rate and rhythm without significant murmur appreciated.   Diagnostic Studies Carotid duplex: No significant extracranial carotid stenosis ABI: Right is 0.73, left is 0.97 biphasic waveforms on the left, aphasic on the right duplex of the legs showed no elevation in velocities.  Assessment:  Neurovascular disease with ulceration, status  post percutaneous intervention and subsequent ulcer healing Plan:  Today's ultrasound shows a patent vascular system on the right leg which was the leg that was intervened on.  Her ulcer has healed.  No significant velocity elevations were detected on today's ultrasound.  The patient does complain of pain in her right leg which I do not think his vascular in origin.  I have asked her to go back to see Dr. Charlann Boxerlin or her regular medical doctor to see if this is anything to do with her right hip or back.  I will have her followup in 6 months with repeat ultrasound  V. Charlena CrossWells Keyetta Hollingworth IV, M.D. [facility] [place] Office: [phone] Pager:  [phone]

## 2014-06-16 NOTE — Addendum Note (Signed)
Addended by: Sharee PimpleMCCHESNEY, MARILYN K on: 06/16/2014 01:30 PM   Modules accepted: Orders

## 2014-07-02 ENCOUNTER — Other Ambulatory Visit: Payer: Self-pay | Admitting: Neurology

## 2014-07-07 ENCOUNTER — Other Ambulatory Visit (HOSPITAL_COMMUNITY): Payer: Medicare Other

## 2014-07-07 ENCOUNTER — Ambulatory Visit: Payer: Medicare Other | Admitting: Surgery

## 2014-07-07 ENCOUNTER — Encounter (HOSPITAL_COMMUNITY): Payer: Medicare Other

## 2014-08-04 ENCOUNTER — Telehealth: Payer: Self-pay | Admitting: *Deleted

## 2014-08-04 NOTE — Telephone Encounter (Signed)
Mrs. Renae GlossShelton called to report that she was [facility] last week and that they did xrays that showed "she had a disc problem in the back" and that her spine was degenerating. I told her that we did not have any notes yet from that appt. She said the she wanted to get a [place] and I told her that we do not fill out that paperwork. She will talk to her PCP, Dr. Sherryll BurgerShah about this.

## 2014-08-25 ENCOUNTER — Other Ambulatory Visit (HOSPITAL_COMMUNITY): Payer: Medicare Other

## 2014-08-25 ENCOUNTER — Encounter (HOSPITAL_COMMUNITY): Payer: Medicare Other

## 2014-08-25 ENCOUNTER — Ambulatory Visit: Payer: Medicare Other | Admitting: Family

## 2014-09-26 ENCOUNTER — Ambulatory Visit (INDEPENDENT_AMBULATORY_CARE_PROVIDER_SITE_OTHER): Payer: Medicare Other | Admitting: Ophthalmology

## 2014-09-26 DIAGNOSIS — H43813 Vitreous degeneration, bilateral: Secondary | ICD-10-CM

## 2014-09-26 DIAGNOSIS — E11351 Type 2 diabetes mellitus with proliferative diabetic retinopathy with macular edema: Secondary | ICD-10-CM

## 2014-09-26 DIAGNOSIS — E11331 Type 2 diabetes mellitus with moderate nonproliferative diabetic retinopathy with macular edema: Secondary | ICD-10-CM

## 2014-09-26 DIAGNOSIS — E11311 Type 2 diabetes mellitus with unspecified diabetic retinopathy with macular edema: Secondary | ICD-10-CM

## 2014-09-26 DIAGNOSIS — H35033 Hypertensive retinopathy, bilateral: Secondary | ICD-10-CM

## 2014-09-26 DIAGNOSIS — I10 Essential (primary) hypertension: Secondary | ICD-10-CM

## 2014-10-24 ENCOUNTER — Encounter: Payer: Self-pay | Admitting: Neurology

## 2014-10-24 ENCOUNTER — Ambulatory Visit (INDEPENDENT_AMBULATORY_CARE_PROVIDER_SITE_OTHER): Payer: Medicare Other | Admitting: Neurology

## 2014-10-24 VITALS — BP 155/73 | HR 74 | Ht 64.0 in | Wt 159.8 lb

## 2014-10-24 DIAGNOSIS — G40309 Generalized idiopathic epilepsy and epileptic syndromes, not intractable, without status epilepticus: Secondary | ICD-10-CM

## 2014-10-24 DIAGNOSIS — Z5181 Encounter for therapeutic drug level monitoring: Secondary | ICD-10-CM

## 2014-10-24 DIAGNOSIS — L98499 Non-pressure chronic ulcer of skin of other sites with unspecified severity: Secondary | ICD-10-CM

## 2014-10-24 DIAGNOSIS — I739 Peripheral vascular disease, unspecified: Secondary | ICD-10-CM

## 2014-10-24 NOTE — Patient Instructions (Signed)

## 2014-10-24 NOTE — Progress Notes (Signed)
Reason for visit: Seizures  Sherral HammersLouise E Jamil is an 80 y.o. female  History of present illness:  Ms. Renae GlossShelton is an 80 year old right-handed white female with a history of diabetes, and significant peripheral vascular disease, chronic renal insufficiency, and a significant gait disorder. She has a history of seizures, but the last seizure was in 2012. The patient fell with a seizure and fractured her left hip at that time. The patient has been placed on lamotrigine, and she has done well with his medication taking 100 mg twice daily. She has not had any further seizure episodes. The patient has a lot of joint discomfort associated with walking. The patient uses a walker for ambulation, and she reports no falls. She returns to this office for an evaluation.  Past Medical History  Diagnosis Date  . Essential hypertension, benign   . Gout   . Ischemic cardiomyopathy   . Systolic and diastolic CHF, chronic     Echo 04/05/12 EF 40-45%, anterolateral/posterior hypokinesis, grade 1 diastolic dysfunction, mild MR, mildly dilated LA  . Hyperlipidemia   . Coronary atherosclerosis of native coronary artery     s/p 4V CABG '97; NSTEMI -->Cardiac cath 04/03/12 revealed a new occlusion of SVG to OM system, Medical therapy (asa, plavix, statin, ranexa).  . Diabetes mellitus, type 2   . Seizures   . Arthritis   . Osteoporosis   . Retinal hemorrhage, both eyes   . Stroke     1980 and then 2 strokes 2012  . GERD (gastroesophageal reflux disease)   . Generalized convulsive epilepsy without mention of intractable epilepsy 10/25/2013  . Peripheral vascular disease   . Benign positional vertigo   . Gait disorder   . Closed right ankle fracture   . Diabetic peripheral neuropathy     Past Surgical History  Procedure Laterality Date  . Coronary artery bypass graft      1997  . Knee surgery      bilateral  . Abdominal hysterectomy    . Cardiac catheterization      11/2010-patent grafts  . Right  foot surgery      big toe removed and other surgeries on rt foot because of infection  . Left hip hemi-arthroplasty  07-31-2011    [facility] FOR FRACTURE LEFT FEMORAL NECK  . Total hip revision  01/23/2012    Procedure: TOTAL HIP REVISION;  Surgeon: Shelda PalMatthew D Olin, MD;  Location: [facility];  Service: Orthopedics;  Laterality: Left;  Conversion of  Previous Surgery to a Left Total Hip  . Abdominal hysterectomy    . Joint replacement  Jan. 2013    Left Hip  . Cardiac catheterization      7/14,8/14    Family History  Problem Relation Age of Onset  . Heart attack Father 4767  . Heart disease Father   . Vision loss Mother   . Diabetes Sister   . Heart disease Sister   . Hyperlipidemia Sister   . Diabetes Brother   . Heart disease Brother     before age 78  . Hyperlipidemia Brother   . Heart disease Daughter     before age 78  . Diabetes Sister   . Heart disease Sister   . Hyperlipidemia Sister   . Diabetes Sister   . Heart disease Sister   . Diabetes Sister   . Diabetes Brother   . Heart disease Brother     before age 260    Social history:  reports that she has never smoked. She has never used smokeless tobacco. She reports that she does not drink alcohol or use illicit drugs.    Allergies  Allergen Reactions  . Ace Inhibitors Other (See Comments)    Hyperkalemia  . Cephalexin Other (See Comments)    REACTION: unknown-but on pt's list of medications that she was told she was very allergic to.  . Furosemide Other (See Comments)    presyncope  . Levofloxacin Swelling  . Vancomycin Hives and Itching  . Bactrim Rash  . Ciprocin-Fluocin-Procin [Fluocinolone Acetonide] Rash  . Ciprofloxacin Rash  . Codeine Rash  . Morphine And Related Rash  . Pentazocine Rash  . Promethazine Swelling and Rash  . Sulfa Antibiotics Swelling and Rash  . Trimethoprim Swelling and Rash    Medications:  Current Outpatient Prescriptions on File Prior to Visit  Medication Sig Dispense  Refill  . allopurinol (ZYLOPRIM) 300 MG tablet Take 300 mg by mouth every morning.       Marland Kitchen aspirin EC 81 MG tablet Take 81 mg by mouth at bedtime.       Marland Kitchen atorvastatin (LIPITOR) 80 MG tablet Take 80 mg by mouth at bedtime.       . Biotin 1000 MCG tablet Take 1,000 mcg by mouth daily.      . Calcium Citrate (CITRACAL PO) Take 2 tablets by mouth every morning.      . carvedilol (COREG) 12.5 MG tablet Take 0.5 tablets (6.25 mg total) by mouth 2 (two) times daily with a meal.  60 tablet  0  . cholecalciferol (VITAMIN D) 1000 UNITS tablet Take 1,000 Units by mouth every morning.       . clopidogrel (PLAVIX) 75 MG tablet Take 1 tablet by mouth every morning.       . gabapentin (NEURONTIN) 100 MG capsule Take 100 mg by mouth 2 (two) times daily.      . Glycerin-Hypromellose-PEG 400 (VISINE TEARS OP) Place 1 drop into both eyes daily as needed. For dry eyes      . insulin aspart (NOVOLOG) 100 UNIT/ML injection Inject 5-10 Units into the skin 3 (three) times daily before meals. Use 5 units every morning and at lunch and use 10 units at night      . isosorbide mononitrate (IMDUR) 30 MG 24 hr tablet Take 30 mg by mouth every morning.       . lamoTRIgine (LAMICTAL) 100 MG tablet TAKE 1 TABLET BY MOUTH TWICE DAILY  180 tablet  1  . LANTUS SOLOSTAR 100 UNIT/ML SOPN Inject 20 Units into the skin every morning.       . meclizine (ANTIVERT) 12.5 MG tablet Take 12.5 mg by mouth 3 (three) times daily as needed for dizziness. For inner ear/dizziness      . nitroGLYCERIN (NITROSTAT) 0.4 MG SL tablet Place 0.4 mg under the tongue every 5 (five) minutes as needed for chest pain.      Marland Kitchen omeprazole (PRILOSEC) 20 MG capsule Take 20 mg by mouth 2 (two) times daily.       . QC PEN NEEDLES 31G X 6 MM MISC 3 (three) times daily.      Marland Kitchen torsemide (DEMADEX) 20 MG tablet Take 20 mg by mouth every morning.        No current facility-administered medications on file prior to visit.    ROS:  Out of a complete 14 system review  of symptoms, the patient complains only of the following symptoms, and all other  reviewed systems are negative.  Hearing loss, drooling Eye redness, light sensitivity, decreased vision right eye Leg swelling Frequency of urination Joint pain, achy muscles, walking difficulties Itching Bruising easily Memory loss  Blood pressure 155/73, pulse 74, height 5\' 4"  (1.626 m), weight 159 lb 12.8 oz (72.485 kg).  Physical Exam  General: The patient is alert and cooperative at the time of the examination. The patient is moderately obese.  Skin: No significant peripheral edema is noted.   Neurologic Exam  Mental status: The patient is oriented x 3.  Cranial nerves: Facial symmetry is present. Speech is normal, no aphasia or dysarthria is noted. Extraocular movements are full. Visual fields are full.  Motor: The patient has good strength in all 4 extremities.  Sensory examination: Soft touch sensation is symmetric on the face, arms, and legs.  Coordination: The patient has good finger-nose-finger and heel-to-shin bilaterally, but the patient has apraxia with the use of the lower extremities..  Gait and station: The patient has a very unstable gait. The patient requires assistance with walking. Tandem gait was not attempted. Romberg is positive.  Reflexes: Deep tendon reflexes are symmetric, but are depressed.   Assessment/Plan:  1. History of seizures  2. Gait disorder  3. Peripheral vascular disease  4. Diabetes  The patient has done quite well with her seizure control. She will continue the Lamictal, and we will check a blood level today. She indicates that within the last week or 2, she has had annual blood work done through her primary care physician, and a liver profile and CBC was done. The patient will follow-up in about one year, or sooner if needed. The patient once again indicates that [place] may be [place].  Marlan Palau. Keith Lenny Fiumara  MD 10/24/2014 11:39 AM  [facility] [address] [place], [place] [id number]  Phone [phone] Fax [phone]

## 2014-10-27 LAB — LAMOTRIGINE LEVEL: LAMOTRIGINE LVL: 4.1 ug/mL (ref 2.0–20.0)

## 2014-11-12 ENCOUNTER — Encounter: Payer: Self-pay | Admitting: Neurology

## 2014-11-17 ENCOUNTER — Telehealth: Payer: Self-pay | Admitting: Cardiology

## 2014-11-17 NOTE — Telephone Encounter (Signed)
Mrs. [name] called today stating that her left leg, foot and ankle continues to swell. States that this has been going on for approximately 3 weeks ago. States also that her hands and arms are hurting with sharp pains in her chest.

## 2014-11-17 NOTE — Telephone Encounter (Signed)
Spoke with patient and she c/o swelling in both legs and feet especially around her ankle. Patient denied having sharp chest pains to nurse and described hurting on the right side of her chest area when she bends down to pick something up that resolves once she gets back into an upright position. Front staff advised to get an appointment for patient with Joni ReiningKathryn Lawrence.

## 2014-11-18 ENCOUNTER — Encounter: Payer: Self-pay | Admitting: Neurology

## 2014-12-04 ENCOUNTER — Encounter (HOSPITAL_COMMUNITY): Payer: Self-pay | Admitting: Cardiology

## 2014-12-05 ENCOUNTER — Encounter: Payer: Self-pay | Admitting: Cardiology

## 2014-12-05 ENCOUNTER — Ambulatory Visit (INDEPENDENT_AMBULATORY_CARE_PROVIDER_SITE_OTHER): Payer: Medicare Other | Admitting: Cardiology

## 2014-12-05 VITALS — BP 131/68 | HR 69 | Ht 62.0 in | Wt 159.0 lb

## 2014-12-05 DIAGNOSIS — L98499 Non-pressure chronic ulcer of skin of other sites with unspecified severity: Secondary | ICD-10-CM

## 2014-12-05 DIAGNOSIS — E785 Hyperlipidemia, unspecified: Secondary | ICD-10-CM

## 2014-12-05 DIAGNOSIS — I2510 Atherosclerotic heart disease of native coronary artery without angina pectoris: Secondary | ICD-10-CM

## 2014-12-05 DIAGNOSIS — I10 Essential (primary) hypertension: Secondary | ICD-10-CM

## 2014-12-05 DIAGNOSIS — I429 Cardiomyopathy, unspecified: Secondary | ICD-10-CM

## 2014-12-05 DIAGNOSIS — I739 Peripheral vascular disease, unspecified: Secondary | ICD-10-CM

## 2014-12-05 MED ORDER — ATORVASTATIN CALCIUM 20 MG PO TABS: 20.0000 mg | ORAL_TABLET | Freq: Every day | ORAL | Status: DC

## 2014-12-05 NOTE — Assessment & Plan Note (Signed)
Reduce Lipitor to 20 mg daily. Hopefully this will help with some of her leg and arm pain. Would still expect good control of LDL on lower dose.

## 2014-12-05 NOTE — Progress Notes (Signed)
Reason for visit: CAD, cardiomyopathy, hypertension, hyperlipidemia  Clinical Summary [name] is an 80 y.o.female last seen in December 2014. She is here with her daughter today for a routine visit. Since I saw her she has become more functionally limited, using a wheelchair and a walker. Describes chronic leg and hand pain. She also reports atypical, brief episodes of sharp chest pain when she twists a certain way, does not sound like angina. She has not been using nitroglycerin for this.  She has a history of multivessel disease status post CABG, documentation of occluded SVG to obtuse marginal system in April 2013. Medical therapy pursued. She has had no major changes in her antianginal regimen. Medications include aspirin, Plavix, Imdur, and Coreg.  Interval follow-up with Dr. Myra GianottiBrabham noted for PAD, note reviewed.  She continues on Lipitor. Lab work from October showed total cholesterol 106, triglycerides 103, HDL 49, and LDL 36. She had normal LFTs. We discussed reducing her Lipitor dose to see if this might help at all with any of her arm and leg pain.  Weight is relatively stable. She reports no progressive edema on Demadex. Lab work in October showed BUN 33, creatinine 1.5.   Allergies  Allergen Reactions  . Ace Inhibitors Other (See Comments)    Hyperkalemia  . Cephalexin Other (See Comments)    REACTION: unknown-but on pt's list of medications that she was told she was very allergic to.  . Furosemide Other (See Comments)    presyncope  . Levofloxacin Swelling  . Vancomycin Hives and Itching  . Bactrim Rash  . Ciprocin-Fluocin-Procin [Fluocinolone Acetonide] Rash  . Ciprofloxacin Rash  . Codeine Rash  . Morphine And Related Rash  . Pentazocine Rash  . Promethazine Swelling and Rash  . Sulfa Antibiotics Swelling and Rash  . Trimethoprim Swelling and Rash    Current Outpatient Prescriptions  Medication Sig Dispense Refill  . allopurinol (ZYLOPRIM) 300 MG tablet  Take 300 mg by mouth every morning.     Marland Kitchen. aspirin EC 81 MG tablet Take 81 mg by mouth at bedtime.     . Biotin 1000 MCG tablet Take 1,000 mcg by mouth daily.    . Calcium Citrate (CITRACAL PO) Take 2 tablets by mouth every morning.    . carvedilol (COREG) 12.5 MG tablet Take 0.5 tablets (6.25 mg total) by mouth 2 (two) times daily with a meal. 60 tablet 0  . cholecalciferol (VITAMIN D) 1000 UNITS tablet Take 1,000 Units by mouth every morning.     . clopidogrel (PLAVIX) 75 MG tablet Take 1 tablet by mouth every morning.     . gabapentin (NEURONTIN) 100 MG capsule Take 100 mg by mouth 2 (two) times daily.    Marland Kitchen. glipiZIDE (GLUCOTROL) 5 MG tablet Take by mouth daily before breakfast.    . Glycerin-Hypromellose-PEG 400 (VISINE TEARS OP) Place 1 drop into both eyes daily as needed. For dry eyes    . insulin aspart (NOVOLOG) 100 UNIT/ML injection Inject 5-10 Units into the skin 3 (three) times daily before meals. Use 5 units every morning and at lunch and use 10 units at night    . isosorbide mononitrate (IMDUR) 30 MG 24 hr tablet Take 30 mg by mouth every morning.     . lamoTRIgine (LAMICTAL) 100 MG tablet TAKE 1 TABLET BY MOUTH TWICE DAILY 180 tablet 1  . LANTUS SOLOSTAR 100 UNIT/ML SOPN Inject 20 Units into the skin every morning.     . nitroGLYCERIN (NITROSTAT) 0.4 MG SL  tablet Place 0.4 mg under the tongue every 5 (five) minutes as needed for chest pain.    Marland Kitchen. omeprazole (PRILOSEC) 20 MG capsule Take 20 mg by mouth 2 (two) times daily.     . QC PEN NEEDLES 31G X 6 MM MISC 3 (three) times daily.    Marland Kitchen. torsemide (DEMADEX) 20 MG tablet Take 20 mg by mouth every morning.     Marland Kitchen. atorvastatin (LIPITOR) 20 MG tablet Take 1 tablet (20 mg total) by mouth daily. 90 tablet 3  . meclizine (ANTIVERT) 12.5 MG tablet Take 12.5 mg by mouth 3 (three) times daily as needed for dizziness. For inner ear/dizziness     No current facility-administered medications for this visit.    Past Medical History  Diagnosis Date   . Essential hypertension, benign   . Gout   . Ischemic cardiomyopathy     LVEF 40-45%  . Systolic and diastolic CHF, chronic   . Hyperlipidemia   . Coronary atherosclerosis of native coronary artery     Multivessel s/p CABG 1997; NSTEMI 04/03/12 - new occlusion of SVG to OM system  . Diabetes mellitus, type 2   . Seizures   . Arthritis   . Osteoporosis   . Retinal hemorrhage, both eyes   . Stroke 1980 and 2012  . GERD (gastroesophageal reflux disease)   . Generalized convulsive epilepsy without mention of intractable epilepsy 10/25/2013  . Peripheral vascular disease   . Benign positional vertigo   . Gait disorder   . Closed right ankle fracture   . Diabetic peripheral neuropathy     Social History [name] reports that she has never smoked. She has never used smokeless tobacco. [name] reports that she does not drink alcohol.  Review of Systems Complete review of systems negative except as otherwise outlined in the clinical summary and also the following. Stable appetite. No bleeding problems. No recent falls.  Physical Examination Filed Vitals:   12/05/14 1331  BP: 131/68  Pulse: 69   Filed Weights   12/05/14 1331  Weight: 159 lb (72.122 kg)    Elderly woman, appears comfortable at rest. In wheelchair. HEENT: Conjunctiva and lids normal, oropharynx clear.  Neck: Supple, no elevated JVP or carotid bruits, no thyromegaly.  Lungs: Clear to auscultation, diminished, nonlabored breathing at rest.  Cardiac: Regular rate and rhythm, no S3, 2/6 systolic murmur, no pericardial rub.  Abdomen: Soft, nontender, bowel sounds present.  Extremities: 1+ edema, right foot with open diabetic support shoe. Skin: Warm and dry. Musculoskeletal: Mild kyphosis. Neuropsychiatric: Alert and oriented x3, affect appropriate.   Problem List and Plan   Coronary atherosclerosis of native coronary artery Multivessel disease with graft disease, plan to continue medical therapy.  No changes to current antianginal regimen. She prefers to keep annual follow-up, sooner if symptoms progress.  Secondary cardiomyopathy LVEF 40-45%. She is symptomatically stable, no major change in weight. She continues on Demadex, beta blocker, has had prior problems with Ace inhibitors (hyperkalemia).  Essential hypertension, benign Blood pressure control is reasonable today. No changes made.  Hyperlipidemia Reduce Lipitor to 20 mg daily. Hopefully this will help with some of her leg and arm pain. Would still expect good control of LDL on lower dose.    Jonelle SidleSamuel G. Sheli Dorin, M.D., F.A.C.C.

## 2014-12-05 NOTE — Assessment & Plan Note (Signed)
LVEF 40-45%. She is symptomatically stable, no major change in weight. She continues on Demadex, beta blocker, has had prior problems with Ace inhibitors (hyperkalemia).

## 2014-12-05 NOTE — Assessment & Plan Note (Signed)
Multivessel disease with graft disease, plan to continue medical therapy. No changes to current antianginal regimen. She prefers to keep annual follow-up, sooner if symptoms progress.

## 2014-12-05 NOTE — Patient Instructions (Signed)
Your physician recommends that you schedule a follow-up appointment in: 1 year f/u. You will receive a reminder letter in the mail in about 10 months reminding you to call and schedule your appointment. If you don't receive this letter, please contact our office. Your physician has recommended you make the following change in your medication:  Decrease your atorvastatin to 20 mg daily. Continue all other medications the same.

## 2014-12-05 NOTE — Assessment & Plan Note (Signed)
Blood pressure control is reasonable today. No changes made. 

## 2014-12-08 ENCOUNTER — Other Ambulatory Visit: Payer: Self-pay | Admitting: *Deleted

## 2014-12-08 ENCOUNTER — Encounter (HOSPITAL_COMMUNITY): Payer: Medicare Other

## 2014-12-08 ENCOUNTER — Other Ambulatory Visit (HOSPITAL_COMMUNITY): Payer: Medicare Other

## 2014-12-08 ENCOUNTER — Ambulatory Visit: Payer: Medicare Other | Admitting: Family

## 2014-12-08 DIAGNOSIS — Z9862 Peripheral vascular angioplasty status: Secondary | ICD-10-CM

## 2014-12-22 ENCOUNTER — Other Ambulatory Visit (HOSPITAL_COMMUNITY): Payer: Medicare Other

## 2014-12-22 ENCOUNTER — Encounter (HOSPITAL_COMMUNITY): Payer: Medicare Other

## 2014-12-22 ENCOUNTER — Ambulatory Visit: Payer: Medicare Other | Admitting: Surgery

## 2014-12-23 ENCOUNTER — Encounter: Payer: Self-pay | Admitting: Surgery

## 2014-12-24 ENCOUNTER — Other Ambulatory Visit: Payer: Self-pay | Admitting: Neurology

## 2014-12-29 ENCOUNTER — Encounter: Payer: Self-pay | Admitting: Surgery

## 2014-12-29 ENCOUNTER — Ambulatory Visit (INDEPENDENT_AMBULATORY_CARE_PROVIDER_SITE_OTHER): Payer: Medicare Other | Admitting: Surgery

## 2014-12-29 ENCOUNTER — Ambulatory Visit (HOSPITAL_COMMUNITY)
Admission: RE | Admit: 2014-12-29 | Discharge: 2014-12-29 | Disposition: A | Discharge status: Home / Self Care | Payer: Medicare Other | Source: Ambulatory Visit | Attending: Surgery | Admitting: Surgery

## 2014-12-29 ENCOUNTER — Ambulatory Visit (INDEPENDENT_AMBULATORY_CARE_PROVIDER_SITE_OTHER)
Admission: RE | Admit: 2014-12-29 | Discharge: 2014-12-29 | Disposition: A | Discharge status: Home / Self Care | Payer: Medicare Other | Source: Ambulatory Visit | Attending: Surgery | Admitting: Surgery

## 2014-12-29 VITALS — BP 157/55 | HR 80 | Temp 98.2°F | Resp 16 | Ht 62.0 in | Wt 158.8 lb

## 2014-12-29 DIAGNOSIS — Z9862 Peripheral vascular angioplasty status: Secondary | ICD-10-CM

## 2014-12-29 DIAGNOSIS — Z9889 Other specified postprocedural states: Secondary | ICD-10-CM

## 2014-12-29 DIAGNOSIS — I739 Peripheral vascular disease, unspecified: Secondary | ICD-10-CM | POA: Insufficient documentation

## 2014-12-29 DIAGNOSIS — Z48812 Encounter for surgical aftercare following surgery on the circulatory system: Secondary | ICD-10-CM

## 2014-12-29 DIAGNOSIS — I70269 Atherosclerosis of native arteries of extremities with gangrene, unspecified extremity: Secondary | ICD-10-CM

## 2014-12-29 NOTE — Addendum Note (Signed)
Addended by: Sharee Pimple on: 12/29/2014 04:47 PM   Modules accepted: Orders

## 2014-12-29 NOTE — Progress Notes (Signed)
Patient name: [name] MRN: [id number] DOB: 09-06-1931 Sex: female     Chief Complaint  Patient presents with  . PVD    6 Month follow up with vascular lab     HISTORY OF PRESENT ILLNESS: The patient is back today for followup. On 08/20/2013 she underwent successful recanalization of an occluded anterior tibial artery with subsequent balloon angioplasty using a 3 mm balloon. This was done for a nonhealing ulcer. She has also undergone right great toe amputation.  Of her wounds have healed.  The patient complains of pain all over.  She states that she gets pain in her legs that wakes her up at night.  She states that she has pain in her arms that wakes her up at night.  She states that she has pain in her hands that is only relieved by opening and closing her hands.  Past Medical History  Diagnosis Date  . Essential hypertension, benign   . Gout   . Ischemic cardiomyopathy     LVEF 40-45%  . Systolic and diastolic CHF, chronic   . Hyperlipidemia   . Coronary atherosclerosis of native coronary artery     Multivessel s/p CABG 1997; NSTEMI 04/03/12 - new occlusion of SVG to OM system  . Diabetes mellitus, type 2   . Seizures   . Arthritis   . Osteoporosis   . Retinal hemorrhage, both eyes   . Stroke 1980 and 2012  . GERD (gastroesophageal reflux disease)   . Generalized convulsive epilepsy without mention of intractable epilepsy 10/25/2013  . Peripheral vascular disease   . Benign positional vertigo   . Gait disorder   . Closed right ankle fracture   . Diabetic peripheral neuropathy     Past Surgical History  Procedure Laterality Date  . Coronary artery bypass graft      1997  . Knee surgery      bilateral  . Abdominal hysterectomy    . Cardiac catheterization      11/2010-patent grafts  . Right foot surgery      big toe removed and other surgeries on rt foot because of infection  . Left hip hemi-arthroplasty  07-31-2011    SURGERY AT [facility] FOR FRACTURE LEFT  FEMORAL NECK  . Total hip revision  01/23/2012    Procedure: TOTAL HIP REVISION;  Surgeon: Shelda Pal, MD;  Location: [facility] ORS;  Service: Orthopedics;  Laterality: Left;  Conversion of  Previous Surgery to a Left Total Hip  . Abdominal hysterectomy    . Joint replacement  Jan. 2013    Left Hip  . Cardiac catheterization      7/14,8/14  . Left heart catheterization with coronary/graft angiogram N/A 04/03/2012    Procedure: LEFT HEART CATHETERIZATION WITH Isabel Caprice;  Surgeon: Herby Abraham, MD;  Location: [facility] CATH LAB;  Service: Cardiovascular;  Laterality: N/A;  . Abdominal aortagram N/A 07/09/2013    Procedure: ABDOMINAL AORTAGRAM;  Surgeon: Nada Libman, MD;  Location: [facility] CATH LAB;  Service: Cardiovascular;  Laterality: N/A;    History   Social History  . Marital Status: Widowed    Spouse Name: N/A    Number of Children: 1  . Years of Education: 10 th   Occupational History  .     Social History Main Topics  . Smoking status: Never Smoker   . Smokeless tobacco: Never Used  . Alcohol Use: No  . Drug Use: No  . Sexual Activity: Not Currently  Birth Control/ Protection: Post-menopausal   Other Topics Concern  . Not on file   Social History Narrative    Family History  Problem Relation Age of Onset  . Heart attack Father 48  . Heart disease Father   . Vision loss Mother   . Diabetes Sister   . Heart disease Sister   . Hyperlipidemia Sister   . Diabetes Brother   . Heart disease Brother     before age 64  . Hyperlipidemia Brother   . Heart disease Daughter     before age 89  . Diabetes Sister   . Heart disease Sister   . Hyperlipidemia Sister   . Diabetes Sister   . Heart disease Sister   . Diabetes Sister   . Diabetes Brother   . Heart disease Brother     before age 11    Allergies as of 12/29/2014 - Review Complete 12/29/2014  Allergen Reaction Noted  . Ace inhibitors Other (See Comments) 10/20/2011  . Cephalexin Other (See  Comments) 01/18/2012  . Furosemide Other (See Comments) 04/13/2011  . Levofloxacin Swelling 10/20/2011  . Vancomycin Hives and Itching 07/11/2013  . Bactrim Rash 04/13/2011  . Ciprocin-fluocin-procin [fluocinolone acetonide] Rash 04/13/2011  . Ciprofloxacin Rash 01/11/2012  . Codeine Rash 04/13/2011  . Morphine and related Rash 04/13/2011  . Pentazocine Rash 04/13/2011  . Promethazine Swelling and Rash 01/11/2012  . Sulfa antibiotics Swelling and Rash 01/11/2012  . Trimethoprim Swelling and Rash 01/11/2012    Current Outpatient Prescriptions on File Prior to Visit  Medication Sig Dispense Refill  . allopurinol (ZYLOPRIM) 300 MG tablet Take 300 mg by mouth every morning.     Marland Kitchen aspirin EC 81 MG tablet Take 81 mg by mouth at bedtime.     Marland Kitchen atorvastatin (LIPITOR) 20 MG tablet Take 1 tablet (20 mg total) by mouth daily. 90 tablet 3  . Biotin 1000 MCG tablet Take 1,000 mcg by mouth daily.    . Calcium Citrate (CITRACAL PO) Take 2 tablets by mouth every morning.    . carvedilol (COREG) 12.5 MG tablet Take 0.5 tablets (6.25 mg total) by mouth 2 (two) times daily with a meal. 60 tablet 0  . cholecalciferol (VITAMIN D) 1000 UNITS tablet Take 1,000 Units by mouth every morning.     . clopidogrel (PLAVIX) 75 MG tablet Take 1 tablet by mouth every morning.     . gabapentin (NEURONTIN) 100 MG capsule Take 100 mg by mouth 2 (two) times daily.    Marland Kitchen glipiZIDE (GLUCOTROL) 5 MG tablet Take by mouth daily before breakfast.    . Glycerin-Hypromellose-PEG 400 (VISINE TEARS OP) Place 1 drop into both eyes daily as needed. For dry eyes    . insulin aspart (NOVOLOG) 100 UNIT/ML injection Inject 5-10 Units into the skin 3 (three) times daily before meals. Use 5 units every morning and at lunch and use 10 units at night    . isosorbide mononitrate (IMDUR) 30 MG 24 hr tablet Take 30 mg by mouth every morning.     . lamoTRIgine (LAMICTAL) 100 MG tablet TAKE 1 TABLET BY MOUTH TWICE DAILY 180 tablet 3  . LANTUS  SOLOSTAR 100 UNIT/ML SOPN Inject 20 Units into the skin every morning.     . meclizine (ANTIVERT) 12.5 MG tablet Take 12.5 mg by mouth 3 (three) times daily as needed for dizziness. For inner ear/dizziness    . nitroGLYCERIN (NITROSTAT) 0.4 MG SL tablet Place 0.4 mg under the tongue every 5 (five) minutes  as needed for chest pain.    Marland Kitchen omeprazole (PRILOSEC) 20 MG capsule Take 20 mg by mouth 2 (two) times daily.     . QC PEN NEEDLES 31G X 6 MM MISC 3 (three) times daily.    Marland Kitchen torsemide (DEMADEX) 20 MG tablet Take 20 mg by mouth every morning.      No current facility-administered medications on file prior to visit.     REVIEW OF SYSTEMS: Please see history of present illness, otherwise negative  PHYSICAL EXAMINATION:   Vital signs are BP 157/55 mmHg  Pulse 80  Temp(Src) 98.2 F (36.8 C) (Oral)  Resp 16  Ht  (1.575 m)  Wt 158 lb 12.8 oz (72.031 kg)  BMI 29.04 kg/m2  SpO2 100% General: The patient appears their stated age. HEENT:  No gross abnormalities Pulmonary:  Non labored breathing Musculoskeletal: There are no major deformities. Neurologic: No focal weakness or paresthesias are detected, Skin: Callused area at the site of the ulcer at the right fifth metatarsal head and base Psychiatric: The patient has normal affect. Cardiovascular: There is a regular rate and rhythm without significant murmur appreciated.  Nonpalpable pedal pulses   Diagnostic Studies Ultrasound was ordered and reviewed.  The patient has had a decrease in her ankle-brachial index on the right.  Today it is 0.59.  Previously it is 0.73.  On the left her previous ankle-brachial index was 0.97.  Today it is 0.83.  On duplex, there are elevated velocities in the popliteal artery at 355 above the knee.  Assessment: Atherosclerosis with history of ulcer Plan: I discussed the ultrasound findings today with the patient.  Because she does not have any open wounds, and because the disease progression appears  to be in the popliteal/superficial femoral artery which has not been previously treated, I would not elect to intervene on this area.  She will come back in 6 months with a repeat ultrasound.  I stressed to the patient today that I do not feel any of her complaints either and her arms or her legs are from vascular etiology.  She has palpable pulses in bilateral radial arteries.  Her hands are warm.  It appears that her discomfort is in the joints.  I suggested that she may have arthritis but she rejected this idea.    Jorge Ny, M.D. [facility] Office: [phone] Pager:  [phone]

## 2015-01-01 ENCOUNTER — Telehealth: Payer: Self-pay | Admitting: *Deleted

## 2015-01-01 NOTE — Telephone Encounter (Signed)
Need a letter to help get medicaid. Nurse advised patient that there isn't any letters that we could provide to qualify her for medicaid. Nurse advised patient that if social services had any requests for the cardiologists, that they should contact our office directly. Patient verbalized understanding of plan.

## 2015-01-23 ENCOUNTER — Telehealth: Payer: Self-pay | Admitting: Cardiology

## 2015-01-23 NOTE — Telephone Encounter (Signed)
Dr Sherryll BurgerShah sent niece over to us to see if she could take diabetic medication due to her heart. Best that we could tell the medications listed below are what he wrote down to show us.  Cozaar ARB

## 2015-01-29 NOTE — Telephone Encounter (Signed)
Spoke with patient and she wasn't sure about the name of the medication that Dr. Sherryll BurgerShah is questioning. [facility] the [facility] and was informed that patient was started on any new medications by Dr. Sherryll BurgerShah or any other providers.

## 2015-04-03 ENCOUNTER — Ambulatory Visit (INDEPENDENT_AMBULATORY_CARE_PROVIDER_SITE_OTHER): Payer: Medicare Other | Admitting: Ophthalmology

## 2015-05-07 ENCOUNTER — Ambulatory Visit (INDEPENDENT_AMBULATORY_CARE_PROVIDER_SITE_OTHER): Payer: Medicare Other | Admitting: Ophthalmology

## 2015-05-14 ENCOUNTER — Ambulatory Visit (INDEPENDENT_AMBULATORY_CARE_PROVIDER_SITE_OTHER): Payer: Medicare Other | Admitting: Ophthalmology

## 2015-05-14 DIAGNOSIS — E11351 Type 2 diabetes mellitus with proliferative diabetic retinopathy with macular edema: Secondary | ICD-10-CM | POA: Diagnosis not present

## 2015-05-14 DIAGNOSIS — H35033 Hypertensive retinopathy, bilateral: Secondary | ICD-10-CM

## 2015-05-14 DIAGNOSIS — I10 Essential (primary) hypertension: Secondary | ICD-10-CM | POA: Diagnosis not present

## 2015-05-14 DIAGNOSIS — E11319 Type 2 diabetes mellitus with unspecified diabetic retinopathy without macular edema: Secondary | ICD-10-CM | POA: Diagnosis not present

## 2015-05-14 DIAGNOSIS — H43813 Vitreous degeneration, bilateral: Secondary | ICD-10-CM | POA: Diagnosis not present

## 2015-05-14 DIAGNOSIS — E11339 Type 2 diabetes mellitus with moderate nonproliferative diabetic retinopathy without macular edema: Secondary | ICD-10-CM | POA: Diagnosis not present

## 2015-06-16 IMAGING — CR DG FOOT COMPLETE 3+V*R*
3 series · 3 of 3 positions shown · non-contrast
Comparison: 06/13/2013

CLINICAL DATA: Wound infection.  Diabetic ulcer.  Redness,
swelling.

RIGHT FOOT COMPLETE - 3+ VIEW

[x foot ap right]
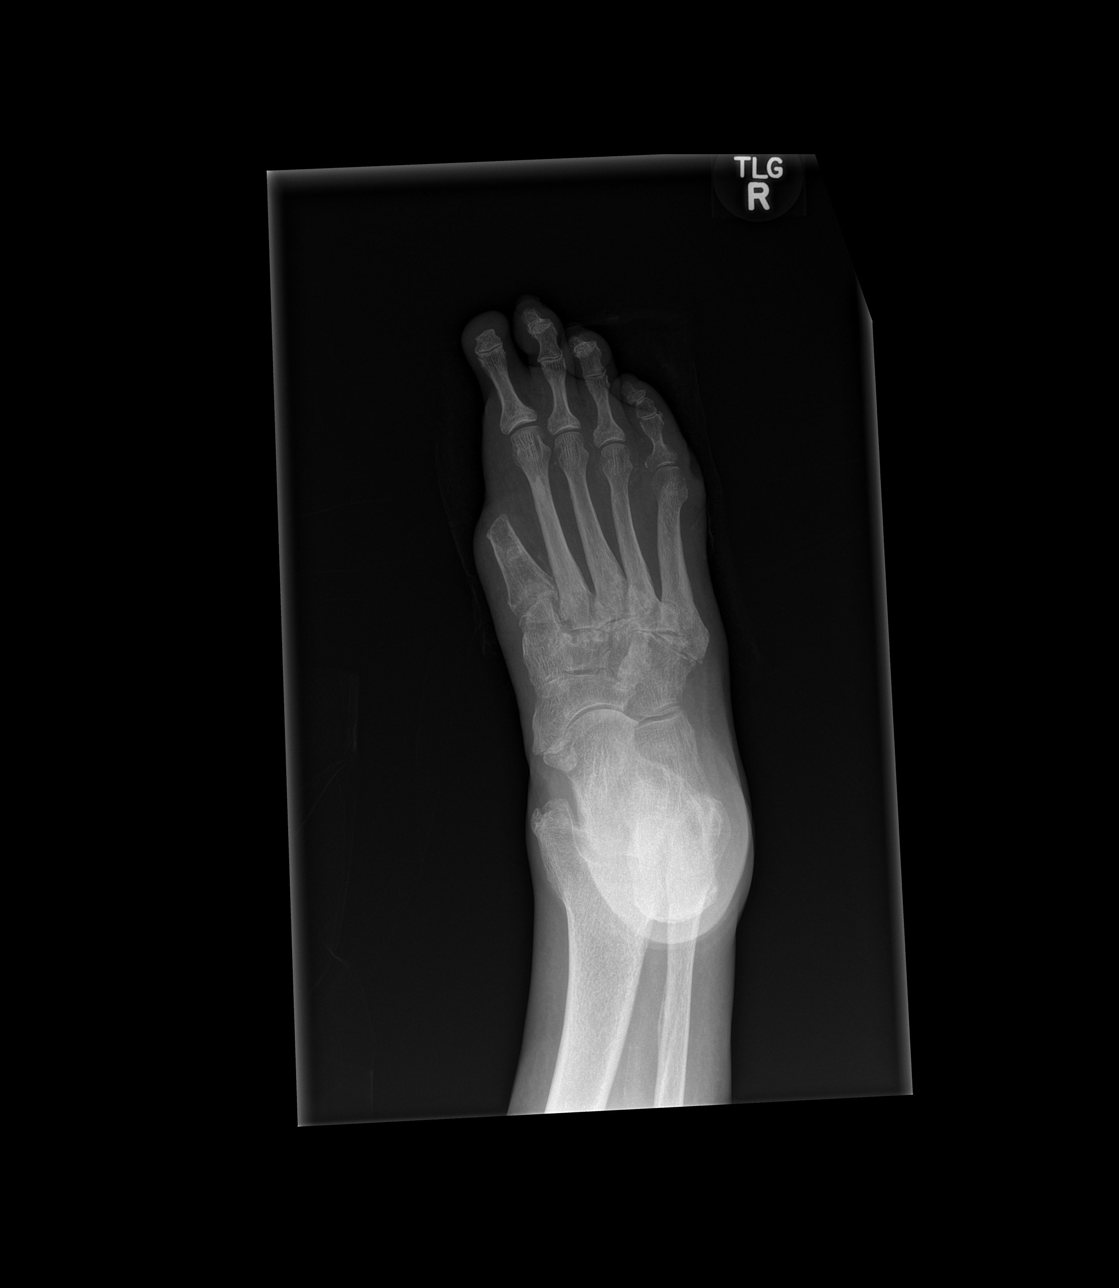

[x foot obl right]
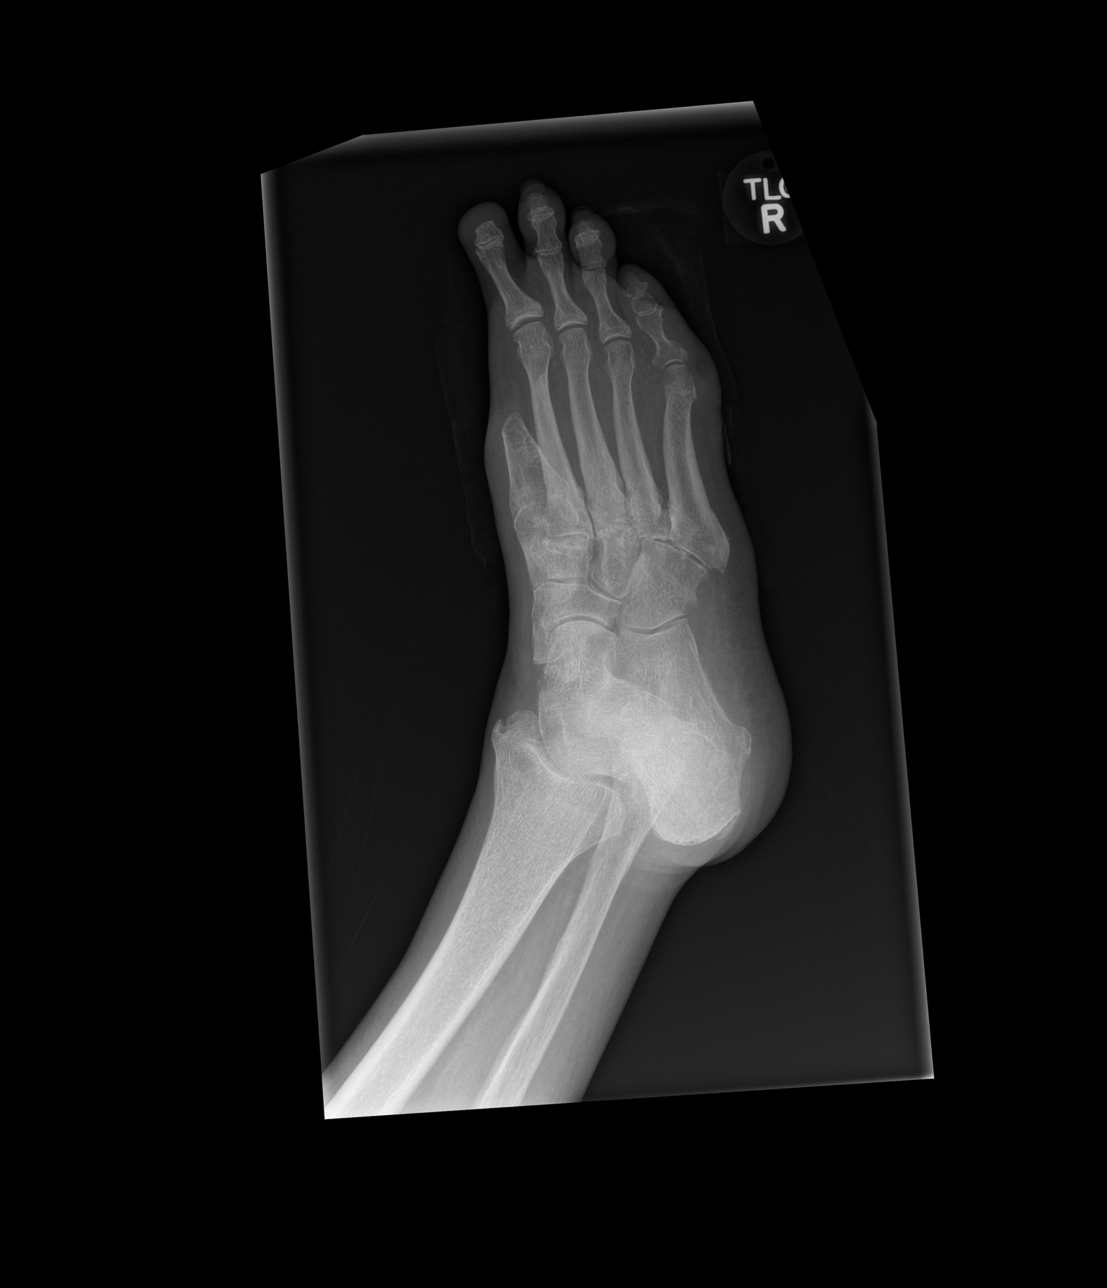

[x foot lat right]
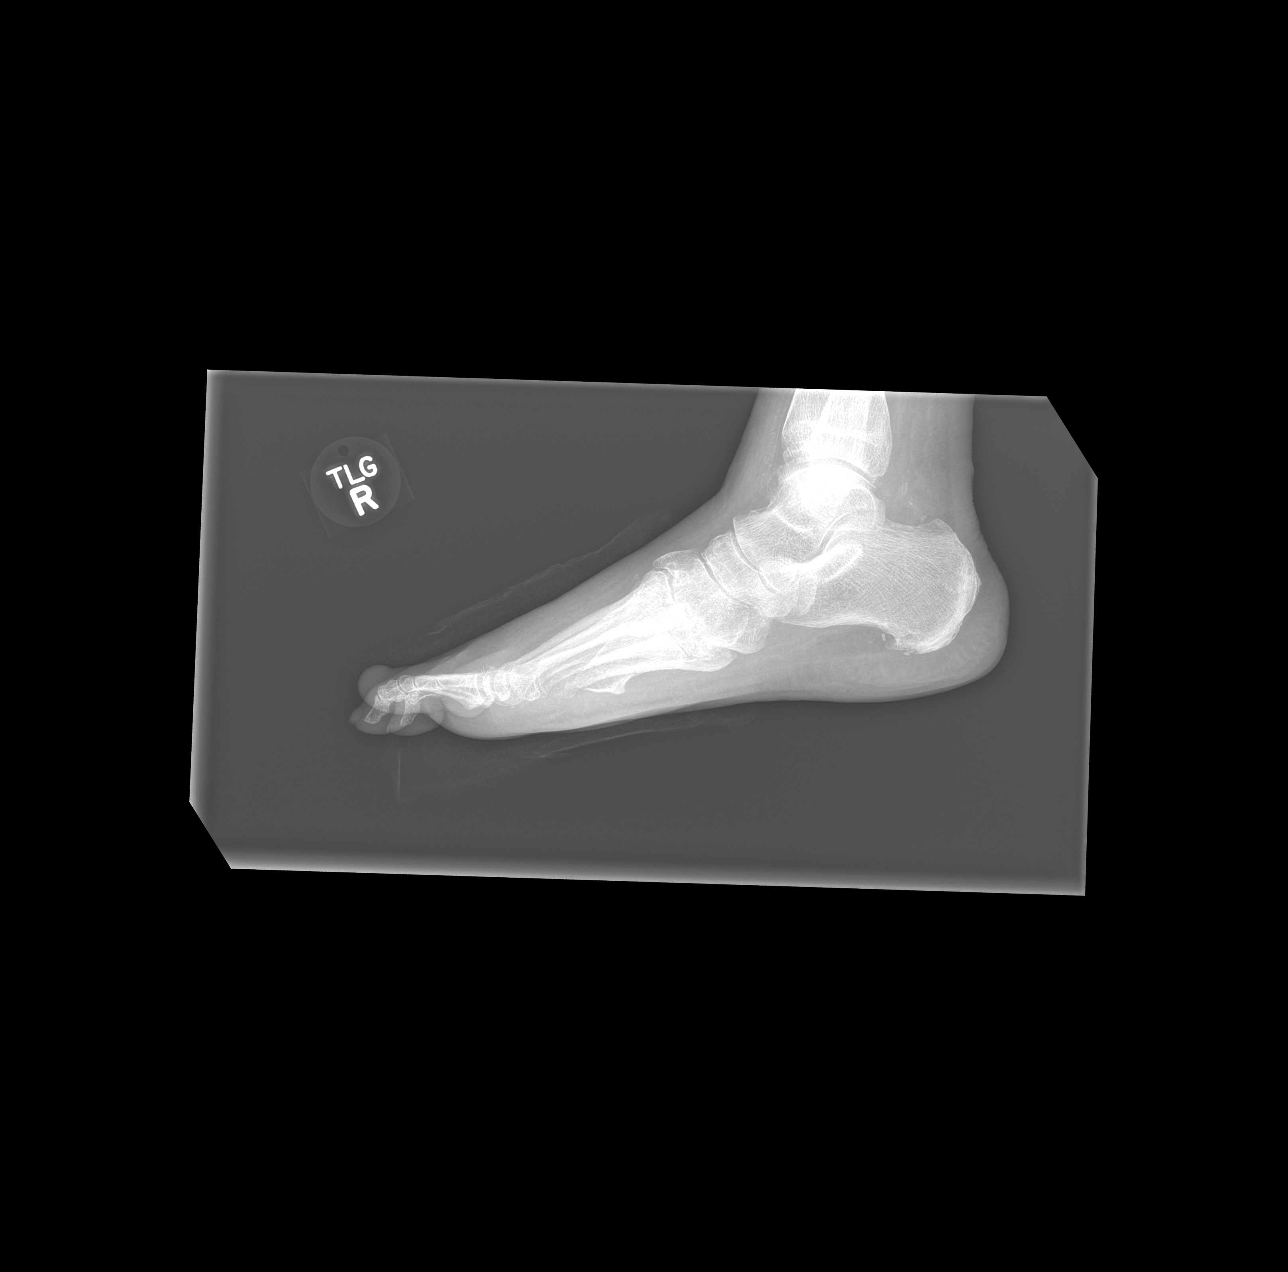

[3 of 3 positions shown; findings below may reference images not displayed]

FINDINGS: Prior right first transmetatarsal amputation. Amputation
of the distal phalanx and distal portion of the middle phalanx of
the second toe.  Soft tissue defect noted over the fifth MTP joint,
similar to prior study.  No underlying acute bony abnormality.  No
convincing evidence for osteomyelitis.
IMPRESSION: Stable chronic findings.  No acute process.

## 2015-07-02 ENCOUNTER — Encounter: Payer: Self-pay | Admitting: Surgery

## 2015-07-06 ENCOUNTER — Ambulatory Visit (HOSPITAL_COMMUNITY)
Admission: RE | Admit: 2015-07-06 | Discharge: 2015-07-06 | Disposition: A | Discharge status: Home / Self Care | Payer: Medicare Other | Source: Ambulatory Visit | Attending: Surgery | Admitting: Surgery

## 2015-07-06 ENCOUNTER — Encounter: Payer: Self-pay | Admitting: Surgery

## 2015-07-06 ENCOUNTER — Ambulatory Visit (INDEPENDENT_AMBULATORY_CARE_PROVIDER_SITE_OTHER)
Admission: RE | Admit: 2015-07-06 | Discharge: 2015-07-06 | Disposition: A | Discharge status: Home / Self Care | Payer: Medicare Other | Source: Ambulatory Visit | Attending: Surgery | Admitting: Surgery

## 2015-07-06 ENCOUNTER — Other Ambulatory Visit: Payer: Self-pay | Admitting: Surgery

## 2015-07-06 ENCOUNTER — Ambulatory Visit (INDEPENDENT_AMBULATORY_CARE_PROVIDER_SITE_OTHER): Payer: Medicare Other | Admitting: Surgery

## 2015-07-06 VITALS — BP 179/75 | HR 64 | Ht 62.0 in | Wt 159.7 lb

## 2015-07-06 DIAGNOSIS — I739 Peripheral vascular disease, unspecified: Secondary | ICD-10-CM

## 2015-07-06 DIAGNOSIS — I70203 Unspecified atherosclerosis of native arteries of extremities, bilateral legs: Secondary | ICD-10-CM | POA: Insufficient documentation

## 2015-07-06 DIAGNOSIS — I70269 Atherosclerosis of native arteries of extremities with gangrene, unspecified extremity: Secondary | ICD-10-CM | POA: Diagnosis not present

## 2015-07-06 DIAGNOSIS — Z48812 Encounter for surgical aftercare following surgery on the circulatory system: Secondary | ICD-10-CM | POA: Diagnosis not present

## 2015-07-06 NOTE — Addendum Note (Signed)
Addended by: Adria DillELDRIDGE-LEWIS, Sheenah Dimitroff L on: 07/06/2015 02:48 PM   Modules accepted: Orders

## 2015-07-06 NOTE — Progress Notes (Signed)
HISTORY AND PHYSICAL     CC:  6 month F/u ABI's Referring Provider:  Kirstie Peri, MD  HPI: This is a 81 y.o. female who has had a balloon angioplasty of the right AT artery and right DP artery August 2014 for a non healing ulcer.  She also had subsequent right great toe amputation.  All of this has healed.  She presents today for 6 month f/u.    She has multiple complaints that include weakness of her BUE, which also includes numbness when she straightens her arms out at night.  She c/o weakness and having a hard time getting in and out of bed and having pain in her arms and legs at night.    She states that she does not have any non healing sores on her feet, but does have a callus on the right outside of her foot, which is doing fine.  She does c/o some swelling in her left ankle as well.    She does have DM, which she takes insulin and glucotrol.  She does take Lipitor for her cholesterol management.  She is also on Plavix/aspirin.  She also takes a beta blocker.   Past Medical History  Diagnosis Date  . Essential hypertension, benign   . Gout   . Ischemic cardiomyopathy     LVEF 40-45%  . Systolic and diastolic CHF, chronic   . Hyperlipidemia   . Coronary atherosclerosis of native coronary artery     Multivessel s/p CABG 1997; NSTEMI 04/03/12 - new occlusion of SVG to OM system  . Diabetes mellitus, type 2   . Seizures   . Arthritis   . Osteoporosis   . Retinal hemorrhage, both eyes   . Stroke 1980 and 2012  . GERD (gastroesophageal reflux disease)   . Generalized convulsive epilepsy without mention of intractable epilepsy 10/25/2013  . Peripheral vascular disease   . Benign positional vertigo   . Gait disorder   . Closed right ankle fracture   . Diabetic peripheral neuropathy   . Myocardial infarction     Past Surgical History  Procedure Laterality Date  . Coronary artery bypass graft      1997  . Knee surgery      bilateral  . Abdominal hysterectomy    . Cardiac  catheterization      11/2010-patent grafts  . Right foot surgery      big toe removed and other surgeries on rt foot because of infection  . Left hip hemi-arthroplasty  07-31-2011    SURGERY AT [facility] FOR FRACTURE LEFT FEMORAL NECK  . Total hip revision  01/23/2012    Procedure: TOTAL HIP REVISION;  Surgeon: Shelda Pal, MD;  Location: [facility];  Service: Orthopedics;  Laterality: Left;  Conversion of  Previous Surgery to a Left Total Hip  . Abdominal hysterectomy    . Joint replacement  Jan. 2013    Left Hip  . Cardiac catheterization      7/14,8/14  . Left heart catheterization with coronary/graft angiogram N/A 04/03/2012    Procedure: LEFT HEART CATHETERIZATION WITH Isabel Caprice;  Surgeon: Herby Abraham, MD;  Location: [facility] CATH LAB;  Service: Cardiovascular;  Laterality: N/A;  . Abdominal aortagram N/A 07/09/2013    Procedure: ABDOMINAL AORTAGRAM;  Surgeon: Nada Libman, MD;  Location: [facility] CATH LAB;  Service: Cardiovascular;  Laterality: N/A;    Allergies  Allergen Reactions  . Ace Inhibitors Other (See Comments)    Hyperkalemia  . Cephalexin Other (  See Comments)    REACTION: unknown-but on pt's list of medications that she was told she was very allergic to.  . Furosemide Other (See Comments)    presyncope  . Levofloxacin Swelling  . Vancomycin Hives and Itching  . Bactrim Rash  . Ciprocin-Fluocin-Procin [Fluocinolone Acetonide] Rash  . Ciprofloxacin Rash  . Codeine Rash  . Morphine And Related Rash  . Pentazocine Rash  . Promethazine Swelling and Rash  . Sulfa Antibiotics Swelling and Rash  . Trimethoprim Swelling and Rash    Current Outpatient Prescriptions  Medication Sig Dispense Refill  . allopurinol (ZYLOPRIM) 300 MG tablet Take 300 mg by mouth every morning.     Marland Kitchen aspirin EC 81 MG tablet Take 81 mg by mouth at bedtime.     Marland Kitchen atorvastatin (LIPITOR) 20 MG tablet Take 1 tablet (20 mg total) by mouth daily. 90 tablet 3  . Biotin 1000 MCG tablet Take 1,000  mcg by mouth daily.    . Calcium Citrate (CITRACAL PO) Take 2 tablets by mouth every morning.    . carvedilol (COREG) 12.5 MG tablet Take 0.5 tablets (6.25 mg total) by mouth 2 (two) times daily with a meal. 60 tablet 0  . cholecalciferol (VITAMIN D) 1000 UNITS tablet Take 1,000 Units by mouth every morning.     . clopidogrel (PLAVIX) 75 MG tablet Take 1 tablet by mouth every morning.     . gabapentin (NEURONTIN) 100 MG capsule Take 100 mg by mouth 2 (two) times daily.    . Glycerin-Hypromellose-PEG 400 (VISINE TEARS OP) Place 1 drop into both eyes daily as needed. For dry eyes    . insulin aspart (NOVOLOG) 100 UNIT/ML injection Inject 5-10 Units into the skin 3 (three) times daily before meals. Use 5 units every morning and at lunch and use 10 units at night    . isosorbide mononitrate (IMDUR) 30 MG 24 hr tablet Take 30 mg by mouth every morning.     . lamoTRIgine (LAMICTAL) 100 MG tablet TAKE 1 TABLET BY MOUTH TWICE DAILY 180 tablet 3  . LANTUS SOLOSTAR 100 UNIT/ML SOPN Inject 20 Units into the skin every morning.     . meclizine (ANTIVERT) 12.5 MG tablet Take 12.5 mg by mouth 3 (three) times daily as needed for dizziness. For inner ear/dizziness    . nitroGLYCERIN (NITROSTAT) 0.4 MG SL tablet Place 0.4 mg under the tongue every 5 (five) minutes as needed for chest pain.    Marland Kitchen omeprazole (PRILOSEC) 20 MG capsule Take 20 mg by mouth 2 (two) times daily.     . QC PEN NEEDLES 31G X 6 MM MISC 3 (three) times daily.    Marland Kitchen torsemide (DEMADEX) 20 MG tablet Take 20 mg by mouth every morning.     Marland Kitchen glipiZIDE (GLUCOTROL) 5 MG tablet Take by mouth daily before breakfast.     No current facility-administered medications for this visit.    Family History  Problem Relation Age of Onset  . Heart attack Father 52  . Heart disease Father   . Hyperlipidemia Father   . Hypertension Father   . Varicose Veins Father   . Vision loss Mother   . Cancer Mother   . Hyperlipidemia Mother   . Hypertension Mother    . Diabetes Sister   . Heart disease Sister   . Hyperlipidemia Sister   . Deep vein thrombosis Sister   . Hypertension Sister   . Varicose Veins Sister   . Heart attack Sister   .  Peripheral vascular disease Sister     amputation  . Diabetes Brother   . Heart disease Brother     before age 79  . Hyperlipidemia Brother   . Cancer Brother   . Hypertension Brother   . Heart disease Daughter     before age 79  . Diabetes Sister   . Heart disease Sister   . Hyperlipidemia Sister   . Cancer Sister   . Hypertension Sister   . Varicose Veins Sister   . Heart attack Sister   . Diabetes Sister   . Heart disease Sister   . Hypertension Sister   . Diabetes Sister   . Hypertension Sister   . Diabetes Brother   . Heart disease Brother     before age 79  . AAA (abdominal aortic aneurysm) Brother     History   Social History  . Marital Status: Widowed    Spouse Name: N/A  . Number of Children: 1  . Years of Education: 10 th   Occupational History  .     Social History Main Topics  . Smoking status: Never Smoker   . Smokeless tobacco: Never Used  . Alcohol Use: No  . Drug Use: No  . Sexual Activity: Not Currently    Birth Control/ Protection: Post-menopausal   Other Topics Concern  . Not on file   Social History Narrative     ROS: [x]  Positive   [ ]  Negative   [ ]  All sytems reviewed and are negative  Cardiovascular: []  chest pain/pressure []  palpitations []  SOB lying flat []  DOE [x]  pain in legs while walking [x]  pain in feet when lying flat [x]  hx of DVT []  hx of phlebitis [x]  swelling in legs []  varicose veins  Pulmonary: []  productive cough []  asthma []  wheezing  Neurologic: [x]  weakness in [x]  arms [x]  legs [x]  numbness in [x]  arms [x]  legs [x] difficulty speaking or slurred speech [x]  temporary loss of vision in one eye [x]  dizziness  Hematologic: []  bleeding problems []  problems with blood clotting easily  GI []  vomiting blood []   blood in stool  GU: [x]  burning with urination []  blood in urine  Psychiatric: []  hx of major depression  Integumentary: []  rashes []  ulcers  Constitutional: []  fever []  chills   PHYSICAL EXAMINATION:  Filed Vitals:   07/06/15 1332  BP: 179/75  Pulse:    Body mass index is 29.2 kg/(m^2).  General:  WDWN in NAD Gait: Not observed HENT: WNL, normocephalic Pulmonary: normal non-labored breathing , without Rales, rhonchi,  wheezing Cardiac: RRR, without  Murmurs, rubs or gallops; without carotid bruits Abdomen: soft, NT, no masses Skin: without rashes, without ulcers  Vascular Exam/Pulses:  Right Left  Radial 2+ (normal) 2+ (normal)  Ulnar Unable to palpate  Unable to palpate   DP Unable to palpate  Unable to palpate   PT Unable to palpate  Unable to palpate    Extremities: without ischemic changes, without Gangrene , without cellulitis; without open wounds; she does have a callus on the lateral aspect of the right foot Musculoskeletal: no muscle wasting or atrophy  Neurologic: A&O X 3; Appropriate Affect ; SENSATION: normal; MOTOR FUNCTION:  moving all extremities equally. Speech is fluent/normal   Non-Invasive Vascular Imaging:   ABI's 07/06/15: Right:  0.62 Left:  0.98 Previous ABI's 01/08/15: Right:  0.59 Left:  0.83  Segmental BP BUE 07/06/15: Right & left waveforms are triphasic  Arterial duplex 07/06/15: Decreased velocity from previous duplex in  the SFA/popliteal artery from 355 to 257 above the knee.   Pt meds includes: Statin:  Yes.   Beta Blocker:  Yes.   Aspirin:  Yes.   ACEI:  No. ARB:  No. Other Antiplatelet/Anticoagulant:  Yes.   Plavix   ASSESSMENT/PLAN:: 81 y.o. female with hx of balloon angioplasty to the right DP/AT in August 2014.     -her velocities in the right SFA/popliteal artery have improved as have her ABI's.  She does not have any non healing wounds, therefore, we will have her f/u in 6 months with the NP. -she does  have complaints of weakness and numbness in her arms.  Segmental pressures today revealed triphasic waveforms in the brachial artery and she does have palpable radial pulses bilaterally therefore, her symptoms are not vascular in nature.  -she will need to f/u with her PCP for further workup for these issues, which may be related to diabetes, arthritis, etc.  She is on Neurontin.     Doreatha Massed, PA-C [facility] [phone]  Clinic MD:  Pt seen and examined in conjunction with Dr. Myra Gianotti  I agree with the above.  I have seen and evaluated the patient.  We'll vascular standpoint, she remains stable.  I do not think any of her complaints are related to arterial insufficiency.  She will follow up in 6 months with a repeat ultrasound  Wells Brabham

## 2015-10-30 ENCOUNTER — Ambulatory Visit: Payer: Medicare Other | Admitting: Adult Health

## 2015-11-05 ENCOUNTER — Ambulatory Visit: Payer: Medicare Other | Admitting: Adult Health

## 2015-11-18 ENCOUNTER — Other Ambulatory Visit: Payer: Self-pay | Admitting: Cardiology

## 2015-11-26 ENCOUNTER — Ambulatory Visit (INDEPENDENT_AMBULATORY_CARE_PROVIDER_SITE_OTHER): Payer: Medicare Other | Admitting: Ophthalmology

## 2015-12-01 ENCOUNTER — Encounter: Payer: Self-pay | Admitting: Cardiology

## 2015-12-01 ENCOUNTER — Ambulatory Visit (INDEPENDENT_AMBULATORY_CARE_PROVIDER_SITE_OTHER): Payer: Medicare Other | Admitting: Cardiology

## 2015-12-01 VITALS — BP 197/75 | HR 76 | Ht 62.0 in | Wt 157.6 lb

## 2015-12-01 DIAGNOSIS — I70269 Atherosclerosis of native arteries of extremities with gangrene, unspecified extremity: Secondary | ICD-10-CM

## 2015-12-01 DIAGNOSIS — I2510 Atherosclerotic heart disease of native coronary artery without angina pectoris: Secondary | ICD-10-CM | POA: Diagnosis not present

## 2015-12-01 DIAGNOSIS — E785 Hyperlipidemia, unspecified: Secondary | ICD-10-CM | POA: Diagnosis not present

## 2015-12-01 DIAGNOSIS — I429 Cardiomyopathy, unspecified: Secondary | ICD-10-CM | POA: Diagnosis not present

## 2015-12-01 DIAGNOSIS — I10 Essential (primary) hypertension: Secondary | ICD-10-CM | POA: Diagnosis not present

## 2015-12-01 DIAGNOSIS — I779 Disorder of arteries and arterioles, unspecified: Secondary | ICD-10-CM

## 2015-12-01 NOTE — Progress Notes (Signed)
Cardiology Office Note  Date: 12/01/2015   ID: [name], DOB 04/06/1931, MRN [id number]  PCP: Kirstie PeriSHAH,ASHISH, MD  Primary Cardiologist: Nona DellSamuel Gabrian Hoque, MD   Chief Complaint  Patient presents with  . Coronary Artery Disease  . Cardiomyopathy   History of Present Illness: Tiffany HammersLouise E Proctor is an [age] y.o. female last seen in December 2015. She presents with her daughter for a routine follow-up visit. She states that she has had multiple issues, trouble with progressive back and hip pain. She states that she has been seeing Dr. Charlann Boxerlin and there was some discussion about referral to a spine specialist to consider potential surgery, although pain management injections also remain a possibility. At this point she has no plans for scheduled surgery in the works.  She has a history of multivessel disease status post CABG, documentation of occluded SVG to obtuse marginal system in April 2013. Medical therapy has been pursued. She reports no angina symptoms on current regimen, although is functionally limited, uses a wheelchair.  She continues to follow with Dr. Myra GianottiBrabham with history of angioplasty of the right DP/AT in August 2014. She reports chronic leg pain, her vascular studies have been relatively stable however, I reviewed the note from July.  We reviewed her medications which are outlined below. From a cardiac perspective she continues on aspirin, Lipitor, Coreg, Plavix, Imdur, Demadex.  She does have very mild ankle edema, not progressive. No orthopnea or PND.  Past Medical History  Diagnosis Date  . Essential hypertension, benign   . Gout   . Ischemic cardiomyopathy     LVEF 40-45%  . Systolic and diastolic CHF, chronic (HCC)   . Hyperlipidemia   . Coronary atherosclerosis of native coronary artery     Multivessel s/p CABG 1997; NSTEMI 04/03/12 - new occlusion of SVG to OM system  . Diabetes mellitus, type 2 (HCC)   . Seizures (HCC)   . Arthritis   . Osteoporosis   . Retinal  hemorrhage, both eyes   . Stroke [facility](HCC) 1980 and 2012  . GERD (gastroesophageal reflux disease)   . Generalized convulsive epilepsy without mention of intractable epilepsy 10/25/2013  . Peripheral vascular disease (HCC)   . Benign positional vertigo   . Gait disorder   . Closed right ankle fracture   . Diabetic peripheral neuropathy (HCC)   . Myocardial infarction [facility](HCC)     Past Surgical History  Procedure Laterality Date  . Coronary artery bypass graft      1997  . Knee surgery      bilateral  . Abdominal hysterectomy    . Cardiac catheterization      11/2010-patent grafts  . Right foot surgery      big toe removed and other surgeries on rt foot because of infection  . Left hip hemi-arthroplasty  07-31-2011    [facility]ERY [facility] FOR FRACTURE LEFT FEMORAL NECK  . Total hip revision  01/23/2012    Procedure: TOTAL HIP REVISION;  Surgeon: Shelda PalMatthew D Olin, MD;  Location: [facility] ORS;  Service: Orthopedics;  Laterality: Left;  Conversion of  Previous Surgery to a Left Total Hip  . Abdominal hysterectomy    . Joint replacement  Jan. 2013    Left Hip  . Cardiac catheterization      7/14,8/14  . Left heart catheterization with coronary/graft angiogram N/A 04/03/2012    Procedure: LEFT HEART CATHETERIZATION WITH Isabel CapriceORONARY/GRAFT ANGIOGRAM;  Surgeon: Herby Abrahamhomas D Stuckey, MD;  [facility];  Service: Cardiovascular;  Laterality: N/A;  . Abdominal aortagram N/A 07/09/2013    Procedure: ABDOMINAL AORTAGRAM;  Surgeon: Nada Libman, MD;  Location: [facility] CATH LAB;  Service: Cardiovascular;  Laterality: N/A;    Current Outpatient Prescriptions  Medication Sig Dispense Refill  . allopurinol (ZYLOPRIM) 300 MG tablet Take 300 mg by mouth every morning.     Marland Kitchen aspirin EC 81 MG tablet Take 81 mg by mouth at bedtime.     Marland Kitchen atorvastatin (LIPITOR) 20 MG tablet TAKE 1 TABLET BY MOUTH EVERY DAY (DOSE DECREASE) 90 tablet 0  . Biotin 1000 MCG tablet Take 1,000 mcg by mouth daily.    . Calcium Citrate (CITRACAL  PO) Take 2 tablets by mouth every morning.    . carvedilol (COREG) 12.5 MG tablet Take 0.5 tablets (6.25 mg total) by mouth 2 (two) times daily with a meal. 60 tablet 0  . cholecalciferol (VITAMIN D) 1000 UNITS tablet Take 1,000 Units by mouth every morning.     . clopidogrel (PLAVIX) 75 MG tablet Take 1 tablet by mouth every morning.     . gabapentin (NEURONTIN) 100 MG capsule Take 100 mg by mouth 2 (two) times daily.    . Glycerin-Hypromellose-PEG 400 (VISINE TEARS OP) Place 1 drop into both eyes daily as needed. For dry eyes    . insulin aspart (NOVOLOG) 100 UNIT/ML injection Inject 5-10 Units into the skin 3 (three) times daily before meals. Use 5 units every morning and at lunch and use 10 units at night    . isosorbide mononitrate (IMDUR) 30 MG 24 hr tablet Take 30 mg by mouth every morning.     . lamoTRIgine (LAMICTAL) 100 MG tablet TAKE 1 TABLET BY MOUTH TWICE DAILY 180 tablet 3  . LANTUS SOLOSTAR 100 UNIT/ML SOPN Inject 20 Units into the skin every morning.     . meclizine (ANTIVERT) 12.5 MG tablet Take 12.5 mg by mouth 3 (three) times daily as needed for dizziness. For inner ear/dizziness    . nitroGLYCERIN (NITROSTAT) 0.4 MG SL tablet Place 0.4 mg under the tongue every 5 (five) minutes as needed for chest pain.    Marland Kitchen omeprazole (PRILOSEC) 20 MG capsule Take 20 mg by mouth 2 (two) times daily.     . QC PEN NEEDLES 31G X 6 MM MISC 3 (three) times daily.    Marland Kitchen torsemide (DEMADEX) 20 MG tablet Take 20 mg by mouth every morning.      No current facility-administered medications for this visit.   Allergies:  Ace inhibitors; Cephalexin; Furosemide; Levofloxacin; Vancomycin; Bactrim; Ciprocin-fluocin-procin; Ciprofloxacin; Codeine; Morphine and related; Pentazocine; Promethazine; Sulfa antibiotics; and Trimethoprim   Social History: The patient  reports that she has never smoked. She has never used smokeless tobacco. She reports that she does not drink alcohol or use illicit drugs.   ROS:   Please see the history of present illness. Otherwise, complete review of systems is positive for chronic back pain and hip pain.  All other systems are reviewed and negative.   Physical Exam: VS:  BP 197/75 mmHg  Pulse 76  Ht  (1.575 m)  Wt 157 lb 9.6 oz (71.487 kg)  BMI 28.82 kg/m2  SpO2 100%, BMI Body mass index is 28.82 kg/(m^2).  Wt Readings from Last 3 Encounters:  12/01/15 157 lb 9.6 oz (71.487 kg)  07/06/15 159 lb 11.2 oz (72.439 kg)  12/29/14 158 lb 12.8 oz (72.031 kg)    Elderly woman, appears comfortable at rest. In wheelchair. HEENT: Conjunctiva and lids  normal, oropharynx clear.  Neck: Supple, no elevated JVP or carotid bruits, no thyromegaly.  Lungs: Clear to auscultation, diminished, nonlabored breathing at rest.  Cardiac: Regular rate and rhythm, no S3, 2/6 systolic murmur, no pericardial rub.  Abdomen: Soft, nontender, bowel sounds present.  Extremities: Trace ankle edema. Skin: Warm and dry. Musculoskeletal: Mild kyphosis. Neuropsychiatric: Alert and oriented x3, affect appropriate.  ECG: ECG is not ordered today.  Assessment and Plan:  1. Multivessel CAD status post CABG with documented graft disease is being managed medically. She is symptomatically stable on medical therapy with low-level activity. If she were to consider undergoing extensive spine surgery, she would need to have a Cardiolite study to better clarify perioperative risk.  2. History of cardiomyopathy with LVEF 40-45%. She continues on Demadex and her weight is stable. No significant evidence of volume overload.  3. Essential hypertension, blood pressure is elevated today although patient complaining of back and hip pain. She will keep follow-up with Dr. Sherryll Burger and can consider medication adjustments as needed. I do not have access to her recent lab work at this time.  4. Hyperlipidemia, on Lipitor. Keep follow-up with Dr. Sherryll Burger.  5. PAD, followed by Dr. Myra Gianotti. Complaints of chronic  leg pain, although no ulcerations or obvious evidence of progression based on on invasive studies over the last 6 months.  Current medicines were reviewed with the patient today.  Disposition: FU with me in 1  year.   Signed, Jonelle Sidle, MD, [facility] 12/01/2015 4:35 PM    [facility] [address] [place], [place], [place] [address] Phone: [phone]; Fax: [phone]

## 2015-12-01 NOTE — Patient Instructions (Signed)

## 2015-12-21 ENCOUNTER — Other Ambulatory Visit: Payer: Self-pay | Admitting: Neurology

## 2016-01-07 ENCOUNTER — Ambulatory Visit: Payer: Medicare Other | Admitting: Adult Health

## 2016-01-08 ENCOUNTER — Encounter: Payer: Self-pay | Admitting: Family

## 2016-01-18 ENCOUNTER — Encounter (HOSPITAL_COMMUNITY): Payer: Medicare Other

## 2016-01-18 ENCOUNTER — Other Ambulatory Visit: Payer: Self-pay | Admitting: Neurology

## 2016-01-18 ENCOUNTER — Ambulatory Visit: Payer: Medicare Other | Admitting: Family

## 2016-01-18 NOTE — Telephone Encounter (Signed)
Has appt scheduled.

## 2016-02-04 ENCOUNTER — Ambulatory Visit: Payer: Medicare Other | Admitting: Adult Health

## 2016-02-11 ENCOUNTER — Ambulatory Visit (INDEPENDENT_AMBULATORY_CARE_PROVIDER_SITE_OTHER): Payer: Medicare Other | Admitting: Ophthalmology

## 2016-02-17 ENCOUNTER — Other Ambulatory Visit: Payer: Self-pay | Admitting: Cardiology

## 2016-02-17 ENCOUNTER — Other Ambulatory Visit: Payer: Self-pay | Admitting: Neurology

## 2016-03-09 ENCOUNTER — Encounter: Payer: Self-pay | Admitting: Surgery

## 2016-03-14 ENCOUNTER — Ambulatory Visit (INDEPENDENT_AMBULATORY_CARE_PROVIDER_SITE_OTHER)
Admission: RE | Admit: 2016-03-14 | Discharge: 2016-03-14 | Disposition: A | Discharge status: Home / Self Care | Payer: Medicare Other | Source: Ambulatory Visit | Attending: Surgery | Admitting: Surgery

## 2016-03-14 ENCOUNTER — Ambulatory Visit (HOSPITAL_COMMUNITY)
Admission: RE | Admit: 2016-03-14 | Discharge: 2016-03-14 | Disposition: A | Discharge status: Home / Self Care | Payer: Medicare Other | Source: Ambulatory Visit | Attending: Surgery | Admitting: Surgery

## 2016-03-14 ENCOUNTER — Encounter: Payer: Self-pay | Admitting: Surgery

## 2016-03-14 ENCOUNTER — Ambulatory Visit (INDEPENDENT_AMBULATORY_CARE_PROVIDER_SITE_OTHER): Payer: Medicare Other | Admitting: Surgery

## 2016-03-14 VITALS — BP 170/78 | HR 66 | Ht 62.0 in | Wt 157.0 lb

## 2016-03-14 DIAGNOSIS — E1142 Type 2 diabetes mellitus with diabetic polyneuropathy: Secondary | ICD-10-CM | POA: Insufficient documentation

## 2016-03-14 DIAGNOSIS — I739 Peripheral vascular disease, unspecified: Secondary | ICD-10-CM

## 2016-03-14 DIAGNOSIS — I255 Ischemic cardiomyopathy: Secondary | ICD-10-CM | POA: Diagnosis not present

## 2016-03-14 DIAGNOSIS — I110 Hypertensive heart disease with heart failure: Secondary | ICD-10-CM | POA: Diagnosis not present

## 2016-03-14 DIAGNOSIS — R938 Abnormal findings on diagnostic imaging of other specified body structures: Secondary | ICD-10-CM | POA: Insufficient documentation

## 2016-03-14 DIAGNOSIS — K219 Gastro-esophageal reflux disease without esophagitis: Secondary | ICD-10-CM | POA: Insufficient documentation

## 2016-03-14 DIAGNOSIS — R0989 Other specified symptoms and signs involving the circulatory and respiratory systems: Secondary | ICD-10-CM | POA: Diagnosis present

## 2016-03-14 DIAGNOSIS — E785 Hyperlipidemia, unspecified: Secondary | ICD-10-CM | POA: Diagnosis not present

## 2016-03-14 DIAGNOSIS — I5042 Chronic combined systolic (congestive) and diastolic (congestive) heart failure: Secondary | ICD-10-CM | POA: Insufficient documentation

## 2016-03-14 NOTE — Progress Notes (Signed)
Patient name: [name] MRN: [id number] DOB: September 29, 1931 Sex: female     Chief Complaint  Patient presents with  . Re-evaluation    6 month f/u     HISTORY OF PRESENT ILLNESS: This is a 82 y.o. female who has had a balloon angioplasty of the right AT artery and right DP artery August 2014 for a non healing ulcer. She also had subsequent right great toe amputation. All of this has healed. She presents today for 6 month f/u.   She has multiple complaints that include weakness of her BUE, which also includes numbness when she straightens her arms out at night. She c/o weakness and having a hard time getting in and out of bed and having pain in her arms and legs at night.   She states that she does not have any non healing sores on her feet, but does have a callus on the right outside of her foot, which is doing fine. She does c/o some swelling in her left ankle as well.   She does have DM, which she takes insulin and glucotrol. She does take Lipitor for her cholesterol management. She is also on Plavix/aspirin. She also takes a beta blocker  Past Medical History  Diagnosis Date  . Essential hypertension, benign   . Gout   . Ischemic cardiomyopathy     LVEF 40-45%  . Systolic and diastolic CHF, chronic (HCC)   . Hyperlipidemia   . Coronary atherosclerosis of native coronary artery     Multivessel s/p CABG 1997; NSTEMI 04/03/12 - new occlusion of SVG to OM system  . Diabetes mellitus, type 2 (HCC)   . Seizures (HCC)   . Arthritis   . Osteoporosis   . Retinal hemorrhage, both eyes   . Stroke [facility]) 1980 and 2012  . GERD (gastroesophageal reflux disease)   . Generalized convulsive epilepsy without mention of intractable epilepsy 10/25/2013  . Peripheral vascular disease (HCC)   . Benign positional vertigo   . Gait disorder   . Closed right ankle fracture   . Diabetic peripheral neuropathy (HCC)   . Myocardial infarction [facility])     Past Surgical History    Procedure Laterality Date  . Coronary artery bypass graft      1997  . Knee surgery      bilateral  . Abdominal hysterectomy    . Cardiac catheterization      11/2010-patent grafts  . Right foot surgery      big toe removed and other surgeries on rt foot because of infection  . Left hip hemi-arthroplasty  07-31-2011    SURGERY AT [facility] FOR FRACTURE LEFT FEMORAL NECK  . Total hip revision  01/23/2012    Procedure: TOTAL HIP REVISION;  Surgeon: Shelda Pal, MD;  Location: [facility];  Service: Orthopedics;  Laterality: Left;  Conversion of  Previous Surgery to a Left Total Hip  . Abdominal hysterectomy    . Joint replacement  Jan. 2013    Left Hip  . Cardiac catheterization      7/14,8/14  . Left heart catheterization with coronary/graft angiogram N/A 04/03/2012    Procedure: LEFT HEART CATHETERIZATION WITH Isabel Caprice;  Surgeon: Herby Abraham, MD;  Location: [facility] CATH LAB;  Service: Cardiovascular;  Laterality: N/A;  . Abdominal aortagram N/A 07/09/2013    Procedure: ABDOMINAL AORTAGRAM;  Surgeon: Nada Libman, MD;  Location: [facility] CATH LAB;  Service: Cardiovascular;  Laterality: N/A;    Social History  Social History  . Marital Status: Widowed    Spouse Name: N/A  . Number of Children: 1  . Years of Education: 10 th   Occupational History  .     Social History Main Topics  . Smoking status: Never Smoker   . Smokeless tobacco: Never Used  . Alcohol Use: No  . Drug Use: No  . Sexual Activity: Not Currently    Birth Control/ Protection: Post-menopausal   Other Topics Concern  . Not on file   Social History Narrative    Family History  Problem Relation Age of Onset  . Heart attack Father 2967  . Heart disease Father   . Hyperlipidemia Father   . Hypertension Father   . Varicose Veins Father   . Vision loss Mother   . Cancer Mother   . Hyperlipidemia Mother   . Hypertension Mother   . Diabetes Sister   . Heart disease Sister   . Hyperlipidemia Sister    . Deep vein thrombosis Sister   . Hypertension Sister   . Varicose Veins Sister   . Heart attack Sister   . Peripheral vascular disease Sister     amputation  . Diabetes Brother   . Heart disease Brother     before age 80  . Hyperlipidemia Brother   . Cancer Brother   . Hypertension Brother   . Heart disease Daughter     before age 80  . Diabetes Sister   . Heart disease Sister   . Hyperlipidemia Sister   . Cancer Sister   . Hypertension Sister   . Varicose Veins Sister   . Heart attack Sister   . Diabetes Sister   . Heart disease Sister   . Hypertension Sister   . Diabetes Sister   . Hypertension Sister   . Diabetes Brother   . Heart disease Brother     before age 80  . AAA (abdominal aortic aneurysm) Brother     Allergies as of 03/14/2016 - Review Complete 03/14/2016  Allergen Reaction Noted  . Ace inhibitors Other (See Comments) 10/20/2011  . Cephalexin Other (See Comments) 01/18/2012  . Furosemide Other (See Comments) 04/13/2011  . Levofloxacin Swelling 10/20/2011  . Vancomycin Hives and Itching 07/11/2013  . Bactrim Rash 04/13/2011  . Ciprocin-fluocin-procin [fluocinolone acetonide] Rash 04/13/2011  . Ciprofloxacin Rash 01/11/2012  . Codeine Rash 04/13/2011  . Morphine and related Rash 04/13/2011  . Pentazocine Rash 04/13/2011  . Promethazine Swelling and Rash 01/11/2012  . Sulfa antibiotics Swelling and Rash 01/11/2012  . Trimethoprim Swelling and Rash 01/11/2012    Current Outpatient Prescriptions on File Prior to Visit  Medication Sig Dispense Refill  . allopurinol (ZYLOPRIM) 300 MG tablet Take 300 mg by mouth every morning.     Marland Kitchen. aspirin EC 81 MG tablet Take 81 mg by mouth at bedtime.     Marland Kitchen. atorvastatin (LIPITOR) 20 MG tablet TAKE 1 TABLET BY MOUTH EVERY DAY (DOSE DECREASE) 90 tablet 1  . Biotin 1000 MCG tablet Take 1,000 mcg by mouth daily.    . Calcium Citrate (CITRACAL PO) Take 2 tablets by mouth every morning.    . carvedilol (COREG) 12.5 MG  tablet Take 0.5 tablets (6.25 mg total) by mouth 2 (two) times daily with a meal. 60 tablet 0  . cholecalciferol (VITAMIN D) 1000 UNITS tablet Take 1,000 Units by mouth every morning.     . clopidogrel (PLAVIX) 75 MG tablet Take 1 tablet by mouth every morning.     .Marland Kitchen  gabapentin (NEURONTIN) 100 MG capsule Take 100 mg by mouth 2 (two) times daily.    . Glycerin-Hypromellose-PEG 400 (VISINE TEARS OP) Place 1 drop into both eyes daily as needed. For dry eyes    . insulin aspart (NOVOLOG) 100 UNIT/ML injection Inject 5-10 Units into the skin 2 (two) times daily. Use 5 units every morning and at lunch and use 10 units at night    . isosorbide mononitrate (IMDUR) 30 MG 24 hr tablet Take 30 mg by mouth every morning.     . lamoTRIgine (LAMICTAL) 100 MG tablet TAKE 1 TABLET BY MOUTH TWICE DAILY 60 tablet 0  . LANTUS SOLOSTAR 100 UNIT/ML SOPN Inject 25 Units into the skin every morning.     . meclizine (ANTIVERT) 12.5 MG tablet Take 12.5 mg by mouth 3 (three) times daily as needed for dizziness. For inner ear/dizziness    . nitroGLYCERIN (NITROSTAT) 0.4 MG SL tablet Place 0.4 mg under the tongue every 5 (five) minutes as needed for chest pain.    Marland Kitchen omeprazole (PRILOSEC) 20 MG capsule Take 20 mg by mouth 2 (two) times daily.     . QC PEN NEEDLES 31G X 6 MM MISC 3 (three) times daily.    Marland Kitchen torsemide (DEMADEX) 20 MG tablet Take 20 mg by mouth every morning.      No current facility-administered medications on file prior to visit.     REVIEW OF SYSTEMS: No changes from prior visit  PHYSICAL EXAMINATION:   Vital signs are  Filed Vitals:   03/14/16 1543 03/14/16 1545  BP: 166/81 170/78  Pulse: 66   Height:  (1.575 m)   Weight: 157 lb (71.215 kg)   SpO2: 98%    Body mass index is 28.71 kg/(m^2). General: The patient appears their stated age. HEENT:  No gross abnormalities Pulmonary:  Non labored breathing Abdomen: Soft and non-tender Musculoskeletal: There are no major  deformities. Neurologic: No focal weakness or paresthesias are detected, Skin: Callison base of right fifth metatarsal head.  No open wounds. Psychiatric: The patient has normal affect. Cardiovascular: There is a regular rate and rhythm without significant murmur appreciated.   Diagnostic Studies I have reviewed her ultrasound.  Velocity profile is 322 and the popliteal artery.  Assessment: Peripheral vascular disease, right lower extremity Plan: I think the ultrasound findings are relatively stable over the course of the past year.  She does not have claudication symptoms or any nonhealing wounds.  Therefore I have elected to continue to monitor the stenosis within the right popliteal artery.  She also has occlusive disease in her tibial vessels.  I will have her follow up in 6 months with a repeat duplex.  Jorge Ny, M.D. [facility] Office: [phone] Pager:  [phone]

## 2016-03-15 NOTE — Addendum Note (Signed)
Addended by: Sharee PimpleMCCHESNEY, MARILYN K on: 03/15/2016 10:15 AM   Modules accepted: Orders

## 2016-04-14 ENCOUNTER — Ambulatory Visit: Payer: Medicare Other | Admitting: Adult Health

## 2016-04-21 ENCOUNTER — Ambulatory Visit (INDEPENDENT_AMBULATORY_CARE_PROVIDER_SITE_OTHER): Payer: Medicare Other | Admitting: Ophthalmology

## 2016-04-21 DIAGNOSIS — H35033 Hypertensive retinopathy, bilateral: Secondary | ICD-10-CM

## 2016-04-21 DIAGNOSIS — E11311 Type 2 diabetes mellitus with unspecified diabetic retinopathy with macular edema: Secondary | ICD-10-CM

## 2016-04-21 DIAGNOSIS — E113511 Type 2 diabetes mellitus with proliferative diabetic retinopathy with macular edema, right eye: Secondary | ICD-10-CM

## 2016-04-21 DIAGNOSIS — E113312 Type 2 diabetes mellitus with moderate nonproliferative diabetic retinopathy with macular edema, left eye: Secondary | ICD-10-CM | POA: Diagnosis not present

## 2016-04-21 DIAGNOSIS — H43813 Vitreous degeneration, bilateral: Secondary | ICD-10-CM | POA: Diagnosis not present

## 2016-04-21 DIAGNOSIS — I10 Essential (primary) hypertension: Secondary | ICD-10-CM

## 2016-04-28 ENCOUNTER — Encounter: Payer: Self-pay | Admitting: Adult Health

## 2016-04-28 ENCOUNTER — Ambulatory Visit (INDEPENDENT_AMBULATORY_CARE_PROVIDER_SITE_OTHER): Payer: Medicare Other | Admitting: Adult Health

## 2016-04-28 VITALS — BP 152/72 | HR 74

## 2016-04-28 DIAGNOSIS — R569 Unspecified convulsions: Secondary | ICD-10-CM | POA: Diagnosis not present

## 2016-04-28 DIAGNOSIS — Z5181 Encounter for therapeutic drug level monitoring: Secondary | ICD-10-CM

## 2016-04-28 MED ORDER — LAMOTRIGINE 100 MG PO TABS: 100.0000 mg | ORAL_TABLET | Freq: Two times a day (BID) | ORAL | Status: AC

## 2016-04-28 NOTE — Progress Notes (Signed)
I have read the note, and I agree with the clinical assessment and plan.  Graceanna Theissen KEITH   

## 2016-04-28 NOTE — Patient Instructions (Signed)
Continue Lamictal 100 mg twice a day Blood work today If you have any seizure events please let us know.    

## 2016-04-28 NOTE — Progress Notes (Signed)
PATIENT: Tiffany HammersLouise E Proctor DOB: 10/28/1931  REASON FOR VISIT: follow up- seizures HISTORY FROM: patient  HISTORY OF PRESENT ILLNESS: [name] is an 82 year old female with a history of seizures. She also has a history of diabetes, peripheral vascular disease and chronic renal insufficiency. She returns today for follow-up. The patient has not been seen in our office since 2015. She reports that she has continued on Lamictal 100 mg twice a day. She denies any seizure events. She does live home alone. She is able to complete most ADLs independently. She does have someone that comes in 3 times a week to help with household chores. She reports that [employer] brings her  dinner. She uses a walker for ambulation. Denies any recent falls. She operates a Librarian, academicmotor vehicle without difficulty. She returns today for an evaluation.  HISTORY 10/24/14 (WILLIS): [name] is an 82 year old right-handed white female with a history of diabetes, and significant peripheral vascular disease, chronic renal insufficiency, and a significant gait disorder. She has a history of seizures, but the last seizure was in 2012. The patient fell with a seizure and fractured her left hip at that time. The patient has been placed on lamotrigine, and she has done well with his medication taking 100 mg twice daily. She has not had any further seizure episodes. The patient has a lot of joint discomfort associated with walking. The patient uses a walker for ambulation, and she reports no falls. She returns to this office for an evaluation  REVIEW OF SYSTEMS: Out of a complete 14 system review of symptoms, the patient complains only of the following symptoms, and all other reviewed systems are negative.  Eye redness, light sensitivity, eye pain, blurred vision, hearing loss, drooling, leg swelling, joint pain, back pain, walking difficulty, frequency of urination, dizziness, bruise easily  ALLERGIES: Allergies  Allergen  Reactions  . Ace Inhibitors Other (See Comments)    Hyperkalemia  . Cephalexin Other (See Comments)    REACTION: unknown-but on pt's list of medications that she was told she was very allergic to.  . Furosemide Other (See Comments)    presyncope  . Levofloxacin Swelling  . Vancomycin Hives and Itching  . Bactrim Rash  . Ciprocin-Fluocin-Procin [Fluocinolone Acetonide] Rash  . Ciprofloxacin Rash  . Codeine Rash  . Morphine And Related Rash  . Pentazocine Rash  . Promethazine Swelling and Rash  . Sulfa Antibiotics Swelling and Rash  . Trimethoprim Swelling and Rash    HOME MEDICATIONS: Outpatient Prescriptions Prior to Visit  Medication Sig Dispense Refill  . allopurinol (ZYLOPRIM) 300 MG tablet Take 300 mg by mouth every morning.     Marland Kitchen. aspirin EC 81 MG tablet Take 81 mg by mouth at bedtime.     Marland Kitchen. atorvastatin (LIPITOR) 20 MG tablet TAKE 1 TABLET BY MOUTH EVERY DAY (DOSE DECREASE) 90 tablet 1  . Biotin 1000 MCG tablet Take 1,000 mcg by mouth daily.    . Calcium Citrate (CITRACAL PO) Take 2 tablets by mouth every morning.    . carvedilol (COREG) 12.5 MG tablet Take 0.5 tablets (6.25 mg total) by mouth 2 (two) times daily with a meal. 60 tablet 0  . cholecalciferol (VITAMIN D) 1000 UNITS tablet Take 1,000 Units by mouth every morning.     . clopidogrel (PLAVIX) 75 MG tablet Take 1 tablet by mouth every morning.     . gabapentin (NEURONTIN) 100 MG capsule Take 100 mg by mouth 2 (two) times daily.    .Marland Kitchen  Glycerin-Hypromellose-PEG 400 (VISINE TEARS OP) Place 1 drop into both eyes daily as needed. For dry eyes    . insulin aspart (NOVOLOG) 100 UNIT/ML injection Inject 5-10 Units into the skin 2 (two) times daily. Use 5 units every morning and at lunch and use 10 units at night    . isosorbide mononitrate (IMDUR) 30 MG 24 hr tablet Take 30 mg by mouth every morning.     . lamoTRIgine (LAMICTAL) 100 MG tablet TAKE 1 TABLET BY MOUTH TWICE DAILY 60 tablet 0  . LANTUS SOLOSTAR 100 UNIT/ML SOPN  Inject 25 Units into the skin every morning.     . meclizine (ANTIVERT) 12.5 MG tablet Take 12.5 mg by mouth 3 (three) times daily as needed for dizziness. For inner ear/dizziness    . nitroGLYCERIN (NITROSTAT) 0.4 MG SL tablet Place 0.4 mg under the tongue every 5 (five) minutes as needed for chest pain.    Marland Kitchen omeprazole (PRILOSEC) 20 MG capsule Take 20 mg by mouth 2 (two) times daily.     . QC PEN NEEDLES 31G X 6 MM MISC 3 (three) times daily.    Marland Kitchen torsemide (DEMADEX) 20 MG tablet Take 20 mg by mouth every morning.      No facility-administered medications prior to visit.    PAST MEDICAL HISTORY: Past Medical History  Diagnosis Date  . Essential hypertension, benign   . Gout   . Ischemic cardiomyopathy     LVEF 40-45%  . Systolic and diastolic CHF, chronic (HCC)   . Hyperlipidemia   . Coronary atherosclerosis of native coronary artery     Multivessel s/p CABG 1997; NSTEMI 04/03/12 - new occlusion of SVG to OM system  . Diabetes mellitus, type 2 (HCC)   . Seizures (HCC)   . Arthritis   . Osteoporosis   . Retinal hemorrhage, both eyes   . Stroke [facility]) 1980 and 2012  . GERD (gastroesophageal reflux disease)   . Generalized convulsive epilepsy without mention of intractable epilepsy 10/25/2013  . Peripheral vascular disease (HCC)   . Benign positional vertigo   . Gait disorder   . Closed right ankle fracture   . Diabetic peripheral neuropathy (HCC)   . Myocardial infarction [facility])     PAST SURGICAL HISTORY: Past Surgical History  Procedure Laterality Date  . Coronary artery bypass graft      1997  . Knee surgery      bilateral  . Abdominal hysterectomy    . Cardiac catheterization      11/2010-patent grafts  . Right foot surgery      big toe removed and other surgeries on rt foot because of infection  . Left hip hemi-arthroplasty  07-31-2011    SURGERY AT [facility] FOR FRACTURE LEFT FEMORAL NECK  . Total hip revision  01/23/2012    Procedure: TOTAL HIP REVISION;  Surgeon:  Shelda Pal, MD;  Location: [facility];  Service: Orthopedics;  Laterality: Left;  Conversion of  Previous Surgery to a Left Total Hip  . Abdominal hysterectomy    . Joint replacement  Jan. 2013    Left Hip  . Cardiac catheterization      7/14,8/14  . Left heart catheterization with coronary/graft angiogram N/A 04/03/2012    Procedure: LEFT HEART CATHETERIZATION WITH Isabel Caprice;  Surgeon: Herby Abraham, MD;  Location: [facility] CATH LAB;  Service: Cardiovascular;  Laterality: N/A;  . Abdominal aortagram N/A 07/09/2013    Procedure: ABDOMINAL AORTAGRAM;  Surgeon: Nada Libman, MD;  Location:  [facility] CATH LAB;  Service: Cardiovascular;  Laterality: N/A;    FAMILY HISTORY: Family History  Problem Relation Age of Onset  . Heart attack Father 25  . Heart disease Father   . Hyperlipidemia Father   . Hypertension Father   . Varicose Veins Father   . Vision loss Mother   . Cancer Mother   . Hyperlipidemia Mother   . Hypertension Mother   . Diabetes Sister   . Heart disease Sister   . Hyperlipidemia Sister   . Deep vein thrombosis Sister   . Hypertension Sister   . Varicose Veins Sister   . Heart attack Sister   . Peripheral vascular disease Sister     amputation  . Diabetes Brother   . Heart disease Brother     before age 62  . Hyperlipidemia Brother   . Cancer Brother   . Hypertension Brother   . Heart disease Daughter     before age 51  . Diabetes Sister   . Heart disease Sister   . Hyperlipidemia Sister   . Cancer Sister   . Hypertension Sister   . Varicose Veins Sister   . Heart attack Sister   . Diabetes Sister   . Heart disease Sister   . Hypertension Sister   . Diabetes Sister   . Hypertension Sister   . Diabetes Brother   . Heart disease Brother     before age 40  . AAA (abdominal aortic aneurysm) Brother     SOCIAL HISTORY: Social History   Social History  . Marital Status: Widowed    Spouse Name: N/A  . Number of Children: 1  . Years of  Education: 10 th   Occupational History  .     Social History Main Topics  . Smoking status: Never Smoker   . Smokeless tobacco: Never Used  . Alcohol Use: No  . Drug Use: No  . Sexual Activity: Not Currently    Birth Control/ Protection: Post-menopausal   Other Topics Concern  . Not on file   Social History Narrative      PHYSICAL EXAM  Filed Vitals:   04/28/16 1547  BP: 152/72  Pulse: 74   There is no weight on file to calculate BMI.  Generalized: Well developed, in no acute distress   Neurological examination  Mentation: Alert oriented to time, place, history taking. Follows all commands speech and language fluent Cranial nerve II-XII: Pupils were equal round reactive to light. Extraocular movements were full, visual field were full on confrontational test. Facial sensation and strength were normal. Uvula tongue midline. Head turning and shoulder shrug  were normal and symmetric. Motor: The motor testing reveals 5 over 5 strength of all 4 extremities. Good symmetric motor tone is noted throughout.  Sensory: Sensory testing is intact to soft touch on all 4 extremities. No evidence of extinction is noted.  Coordination: Cerebellar testing reveals good finger-nose-finger and heel-to-shin bilaterally.  Gait and station: Patient in a wheelchair..  Reflexes: Deep tendon reflexes are symmetric and normal bilaterally.   DIAGNOSTIC DATA (LABS, IMAGING, TESTING) - I reviewed patient records, labs, notes, testing and imaging myself where available.     ASSESSMENT AND PLAN 82 y.o. 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 here with:  1. Seizures  Overall the patient has done well. She will continue on Lamictal 100 mg twice a day. I will check blood work today. Patient has been advised that if she has any additional seizure events she should let us know. She will follow-up in one year or sooner if needed.  Butch Penny, MSN, NP-C 04/28/2016, 3:45 PM [facility] [address], [address] [place], [place] [address] [phone]  ,

## 2016-04-29 LAB — CBC WITH DIFFERENTIAL/PLATELET
Basophils Absolute: 0.1 10*3/uL (ref 0.0–0.2)
Basos: 1 %
EOS (ABSOLUTE): 0.3 10*3/uL (ref 0.0–0.4)
Eos: 3 %
HEMOGLOBIN: 12.3 g/dL (ref 11.1–15.9)
Hematocrit: 37.9 % (ref 34.0–46.6)
IMMATURE GRANULOCYTES: 0 %
Immature Grans (Abs): 0 10*3/uL (ref 0.0–0.1)
Lymphocytes Absolute: 1.9 10*3/uL (ref 0.7–3.1)
Lymphs: 22 %
MCH: 29.5 pg (ref 26.6–33.0)
MCHC: 32.5 g/dL (ref 31.5–35.7)
MCV: 91 fL (ref 79–97)
MONOCYTES: 9 %
Monocytes Absolute: 0.7 10*3/uL (ref 0.1–0.9)
NEUTROS PCT: 65 %
Neutrophils Absolute: 5.7 10*3/uL (ref 1.4–7.0)
PLATELETS: 197 10*3/uL (ref 150–379)
RBC: 4.17 x10E6/uL (ref 3.77–5.28)
RDW: 14.2 % (ref 12.3–15.4)
WBC: 8.7 10*3/uL (ref 3.4–10.8)

## 2016-04-29 LAB — COMPREHENSIVE METABOLIC PANEL
A/G RATIO: 1.7 (ref 1.2–2.2)
ALT: 15 IU/L (ref 0–32)
AST: 24 IU/L (ref 0–40)
Albumin: 4 g/dL (ref 3.5–4.7)
Alkaline Phosphatase: 94 IU/L (ref 39–117)
BUN/Creatinine Ratio: 22 (ref 12–28)
BUN: 24 mg/dL (ref 8–27)
Bilirubin Total: 0.3 mg/dL (ref 0.0–1.2)
CALCIUM: 9.1 mg/dL (ref 8.7–10.3)
CO2: 24 mmol/L (ref 18–29)
CREATININE: 1.1 mg/dL — AB (ref 0.57–1.00)
Chloride: 99 mmol/L (ref 96–106)
GFR calc Af Amer: 54 mL/min/{1.73_m2} — ABNORMAL LOW (ref >59)
GFR calc non Af Amer: 47 mL/min/{1.73_m2} — ABNORMAL LOW (ref >59)
GLUCOSE: 179 mg/dL — AB (ref 65–99)
Globulin, Total: 2.4 g/dL (ref 1.5–4.5)
POTASSIUM: 4.1 mmol/L (ref 3.5–5.2)
Sodium: 140 mmol/L (ref 134–144)
TOTAL PROTEIN: 6.4 g/dL (ref 6.0–8.5)

## 2016-04-29 LAB — LAMOTRIGINE LEVEL: LAMOTRIGINE LVL: 4.6 ug/mL (ref 2.0–20.0)

## 2016-05-03 ENCOUNTER — Telehealth: Payer: Self-pay | Admitting: *Deleted

## 2016-05-03 NOTE — Telephone Encounter (Signed)
-----   Message from Butch PennyMegan Millikan, NP sent at 05/02/2016  4:30 PM EDT ----- Lab work ok. Kidney function consistent with previous lab work. Please call patient.

## 2016-05-03 NOTE — Telephone Encounter (Signed)
I spoke to pt and relayed that her labwork was ok, kidney function consistent with previous labs work.  Will fax ofv note and lab results to Dr. Sherryll BurgerShah per pts request.

## 2016-05-03 NOTE — Telephone Encounter (Signed)
Fax confirmation received. 

## 2016-05-09 ENCOUNTER — Other Ambulatory Visit: Payer: Self-pay | Admitting: Cardiology

## 2016-05-09 MED ORDER — NITROGLYCERIN 0.4 MG SL SUBL: 0.4000 mg | SUBLINGUAL_TABLET | SUBLINGUAL | Status: DC | PRN

## 2016-05-09 NOTE — Telephone Encounter (Signed)
Medication sent to pharmacy  

## 2016-05-09 NOTE — Telephone Encounter (Signed)
Refill:  nitroGLYCERIN (NITROSTAT) 0.4 MG SL tablet [facility]

## 2016-08-15 ENCOUNTER — Other Ambulatory Visit: Payer: Self-pay | Admitting: Cardiology

## 2016-08-20 ENCOUNTER — Other Ambulatory Visit: Payer: Self-pay | Admitting: Cardiology

## 2016-10-03 ENCOUNTER — Ambulatory Visit: Payer: Medicare Other | Admitting: Surgery

## 2016-10-03 ENCOUNTER — Encounter (HOSPITAL_COMMUNITY): Payer: Medicare Other

## 2016-11-04 ENCOUNTER — Encounter: Payer: Self-pay | Admitting: Family

## 2016-11-09 ENCOUNTER — Encounter (HOSPITAL_COMMUNITY): Payer: Medicare Other

## 2016-11-09 ENCOUNTER — Ambulatory Visit: Payer: Medicare Other | Admitting: Family

## 2016-11-11 ENCOUNTER — Encounter (INDEPENDENT_AMBULATORY_CARE_PROVIDER_SITE_OTHER): Payer: Self-pay | Admitting: *Deleted

## 2016-11-24 ENCOUNTER — Ambulatory Visit (INDEPENDENT_AMBULATORY_CARE_PROVIDER_SITE_OTHER): Payer: Medicare Other | Admitting: Ophthalmology

## 2016-11-24 DIAGNOSIS — H43813 Vitreous degeneration, bilateral: Secondary | ICD-10-CM

## 2016-11-24 DIAGNOSIS — E113511 Type 2 diabetes mellitus with proliferative diabetic retinopathy with macular edema, right eye: Secondary | ICD-10-CM | POA: Diagnosis not present

## 2016-11-24 DIAGNOSIS — E113392 Type 2 diabetes mellitus with moderate nonproliferative diabetic retinopathy without macular edema, left eye: Secondary | ICD-10-CM | POA: Diagnosis not present

## 2016-11-24 DIAGNOSIS — I10 Essential (primary) hypertension: Secondary | ICD-10-CM | POA: Diagnosis not present

## 2016-11-24 DIAGNOSIS — H35033 Hypertensive retinopathy, bilateral: Secondary | ICD-10-CM

## 2016-11-24 DIAGNOSIS — E11311 Type 2 diabetes mellitus with unspecified diabetic retinopathy with macular edema: Secondary | ICD-10-CM

## 2016-11-29 ENCOUNTER — Ambulatory Visit (INDEPENDENT_AMBULATORY_CARE_PROVIDER_SITE_OTHER): Payer: Medicare Other | Admitting: Internal Medicine

## 2017-01-10 ENCOUNTER — Encounter: Payer: Self-pay | Admitting: Family

## 2017-01-11 ENCOUNTER — Ambulatory Visit: Payer: Medicare Other | Admitting: Cardiology

## 2017-01-12 ENCOUNTER — Ambulatory Visit: Payer: Medicare Other | Admitting: Family

## 2017-01-12 ENCOUNTER — Ambulatory Visit (HOSPITAL_COMMUNITY)
Admission: RE | Admit: 2017-01-12 | Payer: Medicare Other | Source: Ambulatory Visit | Attending: Surgery | Admitting: Surgery

## 2017-01-27 ENCOUNTER — Encounter: Payer: Self-pay | Admitting: Internal Medicine

## 2017-02-01 ENCOUNTER — Encounter: Payer: Self-pay | Admitting: *Deleted

## 2017-02-01 NOTE — Progress Notes (Deleted)
Cardiology Office Note  Date: 02/01/2017   ID: [name], DOB 11-15-1931, MRN [id number]  PCP: Tiffany Peri, MD  Primary Cardiologist: Tiffany Dell, MD   No chief complaint on file.   History of Present Illness: [name] is an 83 y.o. female last seen in December 2016.  She has a history of multivessel disease status post CABG, documentation of occluded SVG to obtuse marginal system in April 2013. Medical therapy has been pursued.  Past Medical History:  Diagnosis Date  . Arthritis   . Benign positional vertigo   . Closed right ankle fracture   . Coronary atherosclerosis of native coronary artery    Multivessel s/p CABG 1997; NSTEMI 04/03/12 - new occlusion of SVG to OM system  . Diabetes mellitus, type 2 (HCC)   . Diabetic peripheral neuropathy (HCC)   . Essential hypertension, benign   . Gait disorder   . Generalized convulsive epilepsy without mention of intractable epilepsy 10/25/2013  . GERD (gastroesophageal reflux disease)   . Gout   . Hyperlipidemia   . Ischemic cardiomyopathy    LVEF 40-45%  . Myocardial infarction   . Osteoporosis   . Peripheral vascular disease (HCC)   . Retinal hemorrhage, both eyes   . Seizures (HCC)   . Stroke [facility]) 1980 and 2012  . Systolic and diastolic CHF, chronic (HCC)     Past Surgical History:  Procedure Laterality Date  . ABDOMINAL AORTAGRAM N/A 07/09/2013   Procedure: ABDOMINAL Ronny Flurry;  Surgeon: Tiffany Libman, MD;  Location: [facility] CATH LAB;  Service: Cardiovascular;  Laterality: N/A;  . ABDOMINAL HYSTERECTOMY    . ABDOMINAL HYSTERECTOMY    . CARDIAC CATHETERIZATION     11/2010-patent grafts  . CARDIAC CATHETERIZATION     7/14,8/14  . CORONARY ARTERY BYPASS GRAFT     1997  . JOINT REPLACEMENT  Jan. 2013   Left Hip  . KNEE SURGERY     bilateral  . LEFT HEART CATHETERIZATION WITH CORONARY/GRAFT ANGIOGRAM N/A 04/03/2012   Procedure: LEFT HEART CATHETERIZATION WITH [name];  Surgeon: Tiffany Abraham, MD;  Location: [facility] CATH LAB;  Service: Cardiovascular;  Laterality: N/A;  . LEFT HIP HEMI-ARTHROPLASTY  07-31-2011   SURGERY AT [facility] FOR FRACTURE LEFT FEMORAL NECK  . Right foot surgery     big toe removed and other surgeries on rt foot because of infection  . TOTAL HIP REVISION  01/23/2012   Procedure: TOTAL HIP REVISION;  Surgeon: Tiffany Pal, MD;  Location: [facility] ORS;  Service: Orthopedics;  Laterality: Left;  Conversion of  Previous Surgery to a Left Total Hip    Current Outpatient Prescriptions  Medication Sig Dispense Refill  . allopurinol (ZYLOPRIM) 300 MG tablet Take 300 mg by mouth every morning.     Marland Kitchen aspirin EC 81 MG tablet Take 81 mg by mouth at bedtime.     Marland Kitchen atorvastatin (LIPITOR) 20 MG tablet TAKE 1 TABLET BY MOUTH EVERY DAY 90 tablet 1  . Biotin 1000 MCG tablet Take 1,000 mcg by mouth daily.    . Calcium Citrate (CITRACAL PO) Take 2 tablets by mouth every morning.    . carvedilol (COREG) 12.5 MG tablet Take 0.5 tablets (6.25 mg total) by mouth 2 (two) times daily with a meal. 60 tablet 0  . cholecalciferol (VITAMIN D) 1000 UNITS tablet Take 1,000 Units by mouth every morning.     . clopidogrel (PLAVIX) 75 MG tablet Take 1 tablet by mouth every morning.     Marland Kitchen  gabapentin (NEURONTIN) 100 MG capsule Take 100 mg by mouth 2 (two) times daily.    . Glycerin-Hypromellose-PEG 400 (VISINE TEARS OP) Place 1 drop into both eyes daily as needed. For dry eyes    . insulin aspart (NOVOLOG) 100 UNIT/ML injection Inject 5-10 Units into the skin 2 (two) times daily. Use 5 units every morning and at lunch and use 10 units at night    . isosorbide mononitrate (IMDUR) 30 MG 24 hr tablet Take 30 mg by mouth every morning.     . lamoTRIgine (LAMICTAL) 100 MG tablet Take 1 tablet (100 mg total) by mouth 2 (two) times daily. 60 tablet 11  . LANTUS SOLOSTAR 100 UNIT/ML SOPN Inject 25 Units into the skin every morning.     . meclizine (ANTIVERT) 12.5 MG tablet Take 12.5 mg by mouth 3 (three)  times daily as needed for dizziness. For inner ear/dizziness    . nitroGLYCERIN (NITROSTAT) 0.4 MG SL tablet Place 1 tablet (0.4 mg total) under the tongue every 5 (five) minutes as needed for chest pain. 25 tablet 3  . omeprazole (PRILOSEC) 20 MG capsule Take 20 mg by mouth 2 (two) times daily.     . QC PEN NEEDLES 31G X 6 MM MISC 3 (three) times daily.    Marland Kitchen. torsemide (DEMADEX) 20 MG tablet Take 20 mg by mouth every morning.      No current facility-administered medications for this visit.    Allergies:  Ace inhibitors; Cephalexin; Furosemide; Levofloxacin; Vancomycin; Bactrim; Ciprocin-fluocin-procin [fluocinolone acetonide]; Ciprofloxacin; Codeine; Morphine and related; Pentazocine; Promethazine; Sulfa antibiotics; and Trimethoprim   Social History: The patient  reports that she has never smoked. She has never used smokeless tobacco. She reports that she does not drink alcohol or use drugs.   Family History: The patient's family history includes AAA (abdominal aortic aneurysm) in her brother; Cancer in her brother, mother, and sister; Deep vein thrombosis in her sister; Diabetes in her brother, brother, sister, sister, sister, and sister; Heart attack in her sister and sister; Heart attack (age of onset: 1567) in her father; Heart disease in her brother, brother, daughter, father, sister, sister, and sister; Hyperlipidemia in her brother, father, mother, sister, and sister; Hypertension in her brother, father, mother, sister, sister, sister, and sister; Peripheral vascular disease in her sister; Varicose Veins in her father, sister, and sister; Vision loss in her mother.   ROS:  Please see the history of present illness. Otherwise, complete review of systems is positive for {NONE DEFAULTED:18576::"none"}.  All other systems are reviewed and negative.   Physical Exam: VS:  There were no vitals taken for this visit., BMI There is no height or weight on file to calculate BMI.  Wt Readings from Last  3 Encounters:  03/14/16 157 lb (71.2 kg)  12/01/15 157 lb 9.6 oz (71.5 kg)  07/06/15 159 lb 11.2 oz (72.4 kg)    Elderly woman, appears comfortable at rest. In wheelchair. HEENT: Conjunctiva and lids normal, oropharynx clear.  Neck: Supple, no elevated JVP or carotid bruits, no thyromegaly.  Lungs: Clear to auscultation, diminished, nonlabored breathing at rest.  Cardiac: Regular rate and rhythm, no S3, 2/6 systolic murmur, no pericardial rub.  Abdomen: Soft, nontender, bowel sounds present.  Extremities: Trace ankle edema. Skin: Warm and dry. Musculoskeletal: Mild kyphosis. Neuropsychiatric: Alert and oriented x3, affect appropriate.  ECG: I personally reviewed the tracing from 03/25/2014 which showed sinus rhythm with PVC, right bundle branch block, low voltage.  Recent Labwork: 04/28/2016: ALT 15;  AST 24; BUN 24; Creatinine, Ser 1.10; Platelets 197; Potassium 4.1; Sodium 140     Component Value Date/Time   CHOL 227 (H) 04/03/2012 0620   TRIG 268 (H) 04/03/2012 0620   HDL 48 04/03/2012 0620   CHOLHDL 4.7 04/03/2012 0620   VLDL 54 (H) 04/03/2012 0620   LDLCALC 125 (H) 04/03/2012 0620    Other Studies Reviewed Today:  Echocardiogram 07/05/2013: Study Conclusions  Left ventricle: The cavity size was normal. Wall thickness was normal. Systolic function was normal. The estimated ejection fraction was in the range of 55% to 65%. Wall motion was normal; there were no regional wall motion abnormalities. There was an increased relative contribution of atrial contraction to ventricular filling.  Assessment and Plan:    Current medicines were reviewed with the patient today.  No orders of the defined types were placed in this encounter.   Disposition:  Signed, Jonelle Sidle, MD, [facility] 02/01/2017 12:38 PM    [facility] at [facility] [address] [place], [place], [place] [address] Phone: [phone]; Fax: [phone]

## 2017-02-02 ENCOUNTER — Ambulatory Visit: Payer: Medicare Other | Admitting: Cardiology

## 2017-02-13 ENCOUNTER — Other Ambulatory Visit: Payer: Self-pay | Admitting: Cardiology

## 2017-03-06 ENCOUNTER — Encounter: Payer: Self-pay | Admitting: Surgery

## 2017-03-08 NOTE — Progress Notes (Deleted)
Cardiology Office Note  Date: 03/08/2017   ID: [name], DOB 1931/10/09, MRN [id number]  PCP: Kirstie Peri, MD  Primary Cardiologist: Nona Dell, MD   No chief complaint on file.   History of Present Illness: [name] is an 83 y.o. female last seen in December 2016.   She has a history of multivessel disease status post CABG, documentation of occluded SVG to obtuse marginal system in April 2013. Medical therapy has been pursued. She reports no angina symptoms on current regimen, although is functionally limited, uses a wheelchair.  She continues to follow with Dr. Myra Gianotti, had ABIs last year with pending studies for follow-up.  Past Medical History:  Diagnosis Date  . Arthritis   . Benign positional vertigo   . Closed right ankle fracture   . Coronary atherosclerosis of native coronary artery    Multivessel s/p CABG 1997; NSTEMI 04/03/12 - new occlusion of SVG to OM system  . Diabetes mellitus, type 2 (HCC)   . Diabetic peripheral neuropathy (HCC)   . Essential hypertension, benign   . Gait disorder   . Generalized convulsive epilepsy without mention of intractable epilepsy 10/25/2013  . GERD (gastroesophageal reflux disease)   . Gout   . Hyperlipidemia   . Ischemic cardiomyopathy    LVEF 40-45%  . Myocardial infarction   . Osteoporosis   . Peripheral vascular disease (HCC)   . Retinal hemorrhage, both eyes   . Seizures (HCC)   . Stroke [facility]) 1980 and 2012  . Systolic and diastolic CHF, chronic (HCC)     Past Surgical History:  Procedure Laterality Date  . ABDOMINAL AORTAGRAM N/A 07/09/2013   Procedure: ABDOMINAL Ronny Flurry;  Surgeon: Nada Libman, MD;  Location: [facility] CATH LAB;  Service: Cardiovascular;  Laterality: N/A;  . ABDOMINAL HYSTERECTOMY    . ABDOMINAL HYSTERECTOMY    . CARDIAC CATHETERIZATION     11/2010-patent grafts  . CARDIAC CATHETERIZATION     7/14,8/14  . CORONARY ARTERY BYPASS GRAFT     1997  . JOINT REPLACEMENT  Jan. 2013     Left Hip  . KNEE SURGERY     bilateral  . LEFT HEART CATHETERIZATION WITH CORONARY/GRAFT ANGIOGRAM N/A 04/03/2012   Procedure: LEFT HEART CATHETERIZATION WITH Isabel Caprice;  Surgeon: Herby Abraham, MD;  Location: [facility] CATH LAB;  Service: Cardiovascular;  Laterality: N/A;  . LEFT HIP HEMI-ARTHROPLASTY  07-31-2011   SURGERY AT [facility] FOR FRACTURE LEFT FEMORAL NECK  . Right foot surgery     big toe removed and other surgeries on rt foot because of infection  . TOTAL HIP REVISION  01/23/2012   Procedure: TOTAL HIP REVISION;  Surgeon: Shelda Pal, MD;  Location: [facility] ORS;  Service: Orthopedics;  Laterality: Left;  Conversion of  Previous Surgery to a Left Total Hip    Current Outpatient Prescriptions  Medication Sig Dispense Refill  . allopurinol (ZYLOPRIM) 300 MG tablet Take 300 mg by mouth every morning.     Marland Kitchen aspirin EC 81 MG tablet Take 81 mg by mouth at bedtime.     Marland Kitchen atorvastatin (LIPITOR) 20 MG tablet TAKE 1 TABLET BY MOUTH EVERY DAY 90 tablet 1  . Biotin 1000 MCG tablet Take 1,000 mcg by mouth daily.    . Calcium Citrate (CITRACAL PO) Take 2 tablets by mouth every morning.    . carvedilol (COREG) 12.5 MG tablet Take 0.5 tablets (6.25 mg total) by mouth 2 (two) times daily with a meal. 60 tablet 0  .  cholecalciferol (VITAMIN D) 1000 UNITS tablet Take 1,000 Units by mouth every morning.     . clopidogrel (PLAVIX) 75 MG tablet Take 1 tablet by mouth every morning.     . gabapentin (NEURONTIN) 100 MG capsule Take 100 mg by mouth 2 (two) times daily.    . Glycerin-Hypromellose-PEG 400 (VISINE TEARS OP) Place 1 drop into both eyes daily as needed. For dry eyes    . insulin aspart (NOVOLOG) 100 UNIT/ML injection Inject 5-10 Units into the skin 2 (two) times daily. Use 5 units every morning and at lunch and use 10 units at night    . isosorbide mononitrate (IMDUR) 30 MG 24 hr tablet Take 30 mg by mouth every morning.     . lamoTRIgine (LAMICTAL) 100 MG tablet Take 1 tablet (100 mg  total) by mouth 2 (two) times daily. 60 tablet 11  . LANTUS SOLOSTAR 100 UNIT/ML SOPN Inject 25 Units into the skin every morning.     . meclizine (ANTIVERT) 12.5 MG tablet Take 12.5 mg by mouth 3 (three) times daily as needed for dizziness. For inner ear/dizziness    . nitroGLYCERIN (NITROSTAT) 0.4 MG SL tablet Place 1 tablet (0.4 mg total) under the tongue every 5 (five) minutes as needed for chest pain. 25 tablet 3  . omeprazole (PRILOSEC) 20 MG capsule Take 20 mg by mouth 2 (two) times daily.     . QC PEN NEEDLES 31G X 6 MM MISC 3 (three) times daily.    Marland Kitchen torsemide (DEMADEX) 20 MG tablet Take 20 mg by mouth every morning.      No current facility-administered medications for this visit.    Allergies:  Ace inhibitors; Cephalexin; Furosemide; Levofloxacin; Vancomycin; Bactrim; Ciprocin-fluocin-procin [fluocinolone acetonide]; Ciprofloxacin; Codeine; Morphine and related; Pentazocine; Promethazine; Sulfa antibiotics; and Trimethoprim   Social History: The patient  reports that she has never smoked. She has never used smokeless tobacco. She reports that she does not drink alcohol or use drugs.   Family History: The patient's family history includes AAA (abdominal aortic aneurysm) in her brother; Cancer in her brother, mother, and sister; Deep vein thrombosis in her sister; Diabetes in her brother, brother, sister, sister, sister, and sister; Heart attack in her sister and sister; Heart attack (age of onset: 40) in her father; Heart disease in her brother, brother, daughter, father, sister, sister, and sister; Hyperlipidemia in her brother, father, mother, sister, and sister; Hypertension in her brother, father, mother, sister, sister, sister, and sister; Peripheral vascular disease in her sister; Varicose Veins in her father, sister, and sister; Vision loss in her mother.   ROS:  Please see the history of present illness. Otherwise, complete review of systems is positive for {NONE  DEFAULTED:18576::"none"}.  All other systems are reviewed and negative.   Physical Exam: VS:  There were no vitals taken for this visit., BMI There is no height or weight on file to calculate BMI.  Wt Readings from Last 3 Encounters:  03/14/16 157 lb (71.2 kg)  12/01/15 157 lb 9.6 oz (71.5 kg)  07/06/15 159 lb 11.2 oz (72.4 kg)    Elderly woman, appears comfortable at rest. In wheelchair. HEENT: Conjunctiva and lids normal, oropharynx clear.  Neck: Supple, no elevated JVP or carotid bruits, no thyromegaly.  Lungs: Clear to auscultation, diminished, nonlabored breathing at rest.  Cardiac: Regular rate and rhythm, no S3, 2/6 systolic murmur, no pericardial rub.  Abdomen: Soft, nontender, bowel sounds present.  Extremities: Trace ankle edema. Skin: Warm and dry. Musculoskeletal: Mild  kyphosis. Neuropsychiatric: Alert and oriented x3, affect appropriate.  ECG: I personally reviewed the tracing from 03/25/2014 which showed sinus rhythm with incomplete right bundle-branch block and PVC.  Recent Labwork: 04/28/2016: ALT 15; AST 24; BUN 24; Creatinine, Ser 1.10; Platelets 197; Potassium 4.1; Sodium 140     Component Value Date/Time   CHOL 227 (H) 04/03/2012 0620   TRIG 268 (H) 04/03/2012 0620   HDL 48 04/03/2012 0620   CHOLHDL 4.7 04/03/2012 0620   VLDL 54 (H) 04/03/2012 0620   LDLCALC 125 (H) 04/03/2012 0620    Other Studies Reviewed Today:  Lower extremity ABIs 03/14/2016: Right 0.70, left 0.97  Assessment and Plan:    Current medicines were reviewed with the patient today.  No orders of the defined types were placed in this encounter.   Disposition:  Signed, Jonelle SidleSamuel G. Lamerle Jabs, MD, [facility] 03/08/2017 2:59 PM    [facility] [address], [place], [place] [address] Phone: [phone]; Fax: [phone]

## 2017-03-09 ENCOUNTER — Encounter (HOSPITAL_COMMUNITY): Payer: Medicare Other

## 2017-03-09 ENCOUNTER — Ambulatory Visit: Payer: Medicare Other

## 2017-03-10 ENCOUNTER — Encounter: Payer: Self-pay | Admitting: Cardiology

## 2017-03-10 ENCOUNTER — Ambulatory Visit: Payer: Medicare Other | Admitting: Cardiology

## 2017-03-20 ENCOUNTER — Ambulatory Visit: Payer: Medicare Other | Admitting: Surgery

## 2017-03-20 ENCOUNTER — Encounter (HOSPITAL_COMMUNITY): Payer: Medicare Other

## 2017-03-20 ENCOUNTER — Ambulatory Visit (HOSPITAL_COMMUNITY): Payer: Medicare Other

## 2017-05-02 ENCOUNTER — Telehealth: Payer: Self-pay

## 2017-05-02 NOTE — Telephone Encounter (Signed)
I called pt's home number, it is not in service I called [name], niece, per DPR. She advised me that the pt has moved to [place], and the appt on 05/04/17 with Aundra MilletMegan, NP should be cancelled, not rescheduled, since pt is going to get new doctors in [place]. I asked pt's niece to call us back with any questions or concerns.

## 2017-05-03 ENCOUNTER — Other Ambulatory Visit: Payer: Self-pay | Admitting: Adult Health

## 2017-05-03 ENCOUNTER — Other Ambulatory Visit: Payer: Self-pay | Admitting: Cardiology

## 2017-05-04 ENCOUNTER — Ambulatory Visit: Payer: Medicare Other | Admitting: Adult Health

## 2017-06-01 ENCOUNTER — Ambulatory Visit (INDEPENDENT_AMBULATORY_CARE_PROVIDER_SITE_OTHER): Payer: Medicare Other | Admitting: Ophthalmology

## 2020-08-26 DEATH — deceased
